# Patient Record
Sex: Female | Born: 1935 | Race: White | Hispanic: No | Marital: Married | Smoking: Former smoker
Health system: Southern US, Community
[De-identification: ages and names within clinical notes are randomized; demographics above are authoritative.]

## PROBLEM LIST (undated history)

## (undated) DIAGNOSIS — I480 Paroxysmal atrial fibrillation: Secondary | ICD-10-CM

## (undated) DIAGNOSIS — M8580 Other specified disorders of bone density and structure, unspecified site: Secondary | ICD-10-CM

## (undated) DIAGNOSIS — R69 Illness, unspecified: Secondary | ICD-10-CM

## (undated) DIAGNOSIS — A159 Respiratory tuberculosis unspecified: Secondary | ICD-10-CM

## (undated) DIAGNOSIS — G459 Transient cerebral ischemic attack, unspecified: Secondary | ICD-10-CM

## (undated) DIAGNOSIS — D649 Anemia, unspecified: Secondary | ICD-10-CM

## (undated) DIAGNOSIS — E785 Hyperlipidemia, unspecified: Secondary | ICD-10-CM

## (undated) DIAGNOSIS — Z950 Presence of cardiac pacemaker: Secondary | ICD-10-CM

## (undated) DIAGNOSIS — I495 Sick sinus syndrome: Secondary | ICD-10-CM

## (undated) DIAGNOSIS — N390 Urinary tract infection, site not specified: Secondary | ICD-10-CM

## (undated) DIAGNOSIS — F0390 Unspecified dementia without behavioral disturbance: Secondary | ICD-10-CM

## (undated) DIAGNOSIS — G5602 Carpal tunnel syndrome, left upper limb: Secondary | ICD-10-CM

## (undated) HISTORY — DX: Presence of cardiac pacemaker: Z95.0

## (undated) HISTORY — DX: Other specified disorders of bone density and structure, unspecified site: M85.80

## (undated) HISTORY — PX: OTHER SURGICAL HISTORY: SHX169

## (undated) HISTORY — DX: Hyperlipidemia, unspecified: E78.5

## (undated) HISTORY — DX: Illness, unspecified: R69

## (undated) HISTORY — PX: LUMBAR FUSION: SHX111

## (undated) HISTORY — DX: Paroxysmal atrial fibrillation: I48.0

## (undated) HISTORY — DX: Carpal tunnel syndrome, left upper limb: G56.02

## (undated) HISTORY — DX: Anemia, unspecified: D64.9

## (undated) HISTORY — PX: ABDOMINAL HYSTERECTOMY: SHX81

## (undated) HISTORY — DX: Transient cerebral ischemic attack, unspecified: G45.9

## (undated) HISTORY — DX: Urinary tract infection, site not specified: N39.0

---

## 1974-09-26 HISTORY — PX: BACK SURGERY: SHX140

## 2007-09-27 DIAGNOSIS — J111 Influenza due to unidentified influenza virus with other respiratory manifestations: Secondary | ICD-10-CM

## 2007-09-27 HISTORY — DX: Influenza due to unidentified influenza virus with other respiratory manifestations: J11.1

## 2008-08-08 ENCOUNTER — Inpatient Hospital Stay (HOSPITAL_COMMUNITY): Admission: EM | Admit: 2008-08-08 | Discharge: 2008-08-11 | Payer: Self-pay | Admitting: Emergency Medicine

## 2010-08-30 ENCOUNTER — Ambulatory Visit: Payer: Self-pay | Admitting: Cardiology

## 2010-10-04 ENCOUNTER — Encounter: Payer: Self-pay | Admitting: Pulmonary Disease

## 2010-10-22 DIAGNOSIS — E785 Hyperlipidemia, unspecified: Secondary | ICD-10-CM | POA: Insufficient documentation

## 2010-10-25 ENCOUNTER — Ambulatory Visit
Admission: RE | Admit: 2010-10-25 | Discharge: 2010-10-25 | Payer: Self-pay | Source: Home / Self Care | Attending: Pulmonary Disease | Admitting: Pulmonary Disease

## 2010-10-25 DIAGNOSIS — R05 Cough: Secondary | ICD-10-CM | POA: Insufficient documentation

## 2010-10-25 DIAGNOSIS — I4892 Unspecified atrial flutter: Secondary | ICD-10-CM | POA: Insufficient documentation

## 2010-11-05 ENCOUNTER — Encounter: Payer: Self-pay | Admitting: Pulmonary Disease

## 2010-11-05 ENCOUNTER — Ambulatory Visit (INDEPENDENT_AMBULATORY_CARE_PROVIDER_SITE_OTHER): Payer: Medicare PPO | Admitting: Pulmonary Disease

## 2010-11-05 DIAGNOSIS — R05 Cough: Secondary | ICD-10-CM

## 2010-11-11 NOTE — Assessment & Plan Note (Signed)
Summary: consult for chronic cough   Copy to:  Guerry Bruin Primary Provider/Referring Provider:  Guerry Bruin  CC:  Pulmonary Consult.  History of Present Illness: The pt is a 77y/o female who I have been asked to see for chronic cough.  The cough started spontaneously approx. 2mos ago, with no URI prodrome.  It was dry and hacky at first, with no significant mucus.  She was treated with a short course of prednisone, with no improvement in cough.  She was also given a trial of dulera, and again she feels it did not help the cough.  Approximately 2 weeks ago, she began to cough up scant nonpurulent mucus.  The pt states that she has a tickle in her throat, along with a classic description of a globus sensation.  She admits that she has ongoing throat clearing.  The cough is worse with activity, and she denies having cough paroxysms.  She denies both GERD and postnasal drip.  A recent cxr shows no acute process, and the pt tells me she has never had pfts.  She does have a history of smoking, but quit over 20 yrs ago.    Current Medications (verified): 1)  Caltrate 600+d 600-400 Mg-Unit Tabs (Calcium Carbonate-Vitamin D) .... Take 1 Tablet By Mouth Once A Day 2)  Lovastatin 10 Mg Tabs (Lovastatin) .... Take 1 Tablet By Mouth Once A Day 3)  Propafenone Hcl 225 Mg Tabs (Propafenone Hcl) .... Take 1 Tablet By Mouth Three Times A Day 4)  Oxybutynin Chloride 5 Mg Tabs (Oxybutynin Chloride) .... Take 1 Tab By Mouth At Bedtime 5)  Proventil Hfa 108 (90 Base) Mcg/act  Aers (Albuterol Sulfate) .Marland Kitchen.. 1-2 Puffs Every 4-6 Hours As Needed 6)  Dulera 200-5 Mcg/act Aero (Mometasone Furo-Formoterol Fum) .... 2 Puffs Twice Per Day 7)  Vitamin C .... Daily 8)  Vitamin D .... Daily 9)  Vitamin B12 .... Daily 10)  Brain Power Supplament .... Daily  Allergies (verified): No Known Drug Allergies  Past History:  Past Medical History:  ATRIAL FIBRILLATION (ICD-427.31)--Dr. [place] HYPERLIPIDEMIA  (ICD-272.4)    Past Surgical History: back surgery 1965 hysterctomy 1969  Family History: Reviewed history from 10/22/2010 and no changes required. father deceased at age 66 : emphysema, HTN, heart disease  mother deceased at age 81. SDAT 1 brother: lymphoma 1 sister: parkinsons  Social History: Reviewed history from 10/22/2010 and no changes required. Patient states former smoker. started at age 64. less than 1 ppd.  Quit 1990.  pt is married pt has children. pt is retired. prev worked in Engineering geologist.   Review of Systems       The patient complains of shortness of breath with activity, shortness of breath at rest, and productive cough.  The patient denies non-productive cough, coughing up blood, chest pain, irregular heartbeats, acid heartburn, indigestion, loss of appetite, weight change, abdominal pain, difficulty swallowing, sore throat, tooth/dental problems, headaches, nasal congestion/difficulty breathing through nose, sneezing, itching, ear ache, anxiety, depression, hand/feet swelling, joint stiffness or pain, rash, change in color of mucus, and fever.    Vital Signs:  Patient profile:   77 year old female Height:      64 inches Weight:      168 pounds BMI:     28.94 O2 Sat:      94 % on Room air Temp:     97.5 degrees F oral Pulse rate:   58 / minute BP sitting:   160 / 92  (right arm) Cuff  size:   regular  Vitals Entered By: Arman Filter LPN (October 25, 2010 2:32 PM)  O2 Flow:  Room air CC: Pulmonary Consult Comments Medications reviewed with patient Arman Filter LPN  October 25, 2010 2:32 PM    Physical Exam  General:  ow female in nad Eyes:  PERRLA and EOMI.   Nose:  no purulence or discharge seen patent bilat. Mouth:  no lesions or exudates seen Neck:  no jvd, tmg, LN Lungs:  totally clear to auscultation Heart:  rrr, no mrg Abdomen:  soft and nontender, bs+ Extremities:  mild edema, no cyanosis  pulses intact distally Neurologic:  alert and  oriented, moves all 4.   Impression & Recommendations:  Problem # 1:  COUGH, CHRONIC (ICD-786.2) the pt has a 2mos h/o chronic cough that I suspect is more upper airway in origin than lower.  Most frequent causes for this include postnasal drip/sinus disease, reflux disease (LPR), and finally cyclical coughing.  I suspect the latter is the issue for the pt currently.  She does not have anything by history or exam to suggest an ongoing pulmonary cause, but this cannot be excluded 100%.  Will treat with the cyclical cough protocol and see how she responds.  If the cough continues, will need spirometry and further investigation.    Medications Added to Medication List This Visit: 1)  Dulera 200-5 Mcg/act Aero (Mometasone furo-formoterol fum) .... 2 puffs twice per day 2)  Vitamin C  .... Daily 3)  Vitamin D  .... Daily 4)  Vitamin B12  .... Daily 5)  Brain Power Supplament  .... Daily 6)  Tussicaps 10-8 Mg Xr12h-cap (Hydrocod polst-chlorphen polst) .... One every 12hrs if needed for cough 7)  Tessalon Perles 100 Mg Caps (Benzonatate) .... Two by mouth every 6 hrs if needed.  Other Orders: Consultation Level IV (16109)  Patient Instructions: 1)  see cyclical cough protocol 2)  once your 3 days on protocol are done, ease back into your normal life 3)  followup with me one week after finishing protocol   Prescriptions: TESSALON PERLES 100 MG  CAPS (BENZONATATE) two by mouth every 6 hrs if needed.  #30 x 0   Entered and Authorized by:   Barbaraann Share MD   Signed by:   Barbaraann Share MD on 10/25/2010   Method used:   Print then Give to Patient   RxID:   [id number] TUSSICAPS 10-8 MG XR12H-CAP (HYDROCOD POLST-CHLORPHEN POLST) one every 12hrs if needed for cough  #10 x 0   Entered and Authorized by:   Barbaraann Share MD   Signed by:   Barbaraann Share MD on 10/25/2010   Method used:   Print then Give to Patient   RxID:   [id number]    Immunization History:  Influenza  Immunization History:    Influenza:  historical (06/26/2010)   Appended Document: consult for chronic cough megan, please see if this pt has an upcoming ov with me.  Appended Document: consult for chronic cough yes. pt is scheduled to see you this Friday 11-05-2010 at 3:30pm

## 2010-11-23 NOTE — Assessment & Plan Note (Signed)
Summary: rov for cough   Copy to:  Guerry Bruin Primary Provider/Referring Provider:  Guerry Bruin  CC:  Follow up appt.  Pt states cough is 100% resolved.   Pt denied any new complaints. .  History of Present Illness: the pt comes in today for f/u of her chronic cough.  At the last visit, this was felt to be UA in origin, and the pt was treated with the cyclical cough protocol.  She reports today the cough is 100% resolved.   Medications Prior to Update: 1)  Caltrate 600+d 600-400 Mg-Unit Tabs (Calcium Carbonate-Vitamin D) .... Take 1 Tablet By Mouth Once A Day 2)  Lovastatin 10 Mg Tabs (Lovastatin) .... Take 1 Tablet By Mouth Once A Day 3)  Propafenone Hcl 225 Mg Tabs (Propafenone Hcl) .... Take 1 Tablet By Mouth Three Times A Day 4)  Oxybutynin Chloride 5 Mg Tabs (Oxybutynin Chloride) .... Take 1 Tab By Mouth At Bedtime 5)  Proventil Hfa 108 (90 Base) Mcg/act  Aers (Albuterol Sulfate) .Marland Kitchen.. 1-2 Puffs Every 4-6 Hours As Needed 6)  Dulera 200-5 Mcg/act Aero (Mometasone Furo-Formoterol Fum) .... 2 Puffs Twice Per Day 7)  Vitamin C .... Daily 8)  Vitamin D .... Daily 9)  Vitamin B12 .... Daily 10)  Brain Power Supplament .... Daily 11)  Tussicaps 10-8 Mg Xr12h-Cap (Hydrocod Polst-Chlorphen Polst) .... One Every 12hrs If Needed For Cough 12)  Tessalon Perles 100 Mg  Caps (Benzonatate) .... Two By Mouth Every 6 Hrs If Needed.  Current Medications (verified): 1)  Caltrate 600+d 600-400 Mg-Unit Tabs (Calcium Carbonate-Vitamin D) .... Take 1 Tablet By Mouth Once A Day 2)  Lovastatin 10 Mg Tabs (Lovastatin) .... Take 1 Tablet By Mouth Once A Day 3)  Propafenone Hcl 225 Mg Tabs (Propafenone Hcl) .... Take 1 Tablet By Mouth Three Times A Day 4)  Oxybutynin Chloride 5 Mg Tabs (Oxybutynin Chloride) .... Take 1 Tab By Mouth At Bedtime 5)  Proventil Hfa 108 (90 Base) Mcg/act  Aers (Albuterol Sulfate) .Marland Kitchen.. 1-2 Puffs Every 4-6 Hours As Needed 6)  Vitamin C .... Daily 7)  Vitamin D ....  Daily 8)  Vitamin B12 .... Daily 9)  Brain Power Supplament .... Daily 10)  Tussicaps 10-8 Mg Xr12h-Cap (Hydrocod Polst-Chlorphen Polst) .... One Every 12hrs If Needed For Cough 11)  Tessalon Perles 100 Mg  Caps (Benzonatate) .... Two By Mouth Every 6 Hrs If Needed.  Allergies (verified): No Known Drug Allergies  Review of Systems  The patient denies shortness of breath with activity, shortness of breath at rest, productive cough, non-productive cough, coughing up blood, chest pain, irregular heartbeats, acid heartburn, indigestion, loss of appetite, weight change, abdominal pain, difficulty swallowing, sore throat, tooth/dental problems, headaches, nasal congestion/difficulty breathing through nose, sneezing, itching, ear ache, anxiety, depression, hand/feet swelling, joint stiffness or pain, rash, change in color of mucus, and fever.    Vital Signs:  Patient profile:   77 year old female Height:      64 inches Weight:      167.50 pounds BMI:     28.86 O2 Sat:      96 % on Room air Temp:     98.1 degrees F oral Pulse rate:   73 / minute BP sitting:   126 / 80  (left arm) Cuff size:   regular  Vitals Entered By: Arman Filter LPN (November 05, 2010 3:20 PM)  O2 Flow:  Room air CC: Follow up appt.  Pt states cough is 100% resolved.  Pt denied any new complaints.  Comments Medications reviewed with patient Arman Filter LPN  November 05, 2010 3:25 PM    Physical Exam  General:  wd female in nad Lungs:  clear Neurologic:  alert, oriented, moves all 4.    Impression & Recommendations:  Problem # 1:  COUGH, CHRONIC (ICD-786.2)  the pt's cough has totally resolved with the cyclical cough protocol.  She is off her inhalers, and denies any issues with upper airway irritation.  I have asked her to return to her normal life, but to intervene early with behavioral therapies in the future due to her propensity to develop a cyclical cough.  Other Orders: Est. Patient Level II  (81191)  Patient Instructions: 1)  stay off inhalers 2)  get back to your normal life, but if cough returns, work on the behavioral therapies we have discussed EARLY 3)  please call if cough returns and is bothering you

## 2010-12-15 ENCOUNTER — Encounter (HOSPITAL_BASED_OUTPATIENT_CLINIC_OR_DEPARTMENT_OTHER)
Admission: RE | Admit: 2010-12-15 | Discharge: 2010-12-15 | Disposition: A | Discharge status: Home / Self Care | Payer: Medicare PPO | Source: Ambulatory Visit | Attending: Orthopedic Surgery | Admitting: Orthopedic Surgery

## 2010-12-15 DIAGNOSIS — Z01812 Encounter for preprocedural laboratory examination: Secondary | ICD-10-CM | POA: Insufficient documentation

## 2010-12-15 LAB — BASIC METABOLIC PANEL
BUN: 16 mg/dL (ref 6–23)
CO2: 26 mEq/L (ref 19–32)
Chloride: 99 mEq/L (ref 96–112)
Creatinine, Ser: 1.02 mg/dL (ref 0.4–1.2)
GFR calc non Af Amer: 52 mL/min — ABNORMAL LOW (ref >60)
Glucose, Bld: 100 mg/dL — ABNORMAL HIGH (ref 70–99)
Sodium: 131 mEq/L — ABNORMAL LOW (ref 135–145)

## 2010-12-20 ENCOUNTER — Ambulatory Visit (HOSPITAL_BASED_OUTPATIENT_CLINIC_OR_DEPARTMENT_OTHER)
Admission: RE | Admit: 2010-12-20 | Discharge: 2010-12-20 | Disposition: A | Discharge status: Home / Self Care | Payer: Medicare PPO | Source: Ambulatory Visit | Attending: Orthopedic Surgery | Admitting: Orthopedic Surgery

## 2010-12-20 DIAGNOSIS — G5602 Carpal tunnel syndrome, left upper limb: Secondary | ICD-10-CM

## 2010-12-20 DIAGNOSIS — J449 Chronic obstructive pulmonary disease, unspecified: Secondary | ICD-10-CM | POA: Insufficient documentation

## 2010-12-20 DIAGNOSIS — J4489 Other specified chronic obstructive pulmonary disease: Secondary | ICD-10-CM | POA: Insufficient documentation

## 2010-12-20 DIAGNOSIS — Z01812 Encounter for preprocedural laboratory examination: Secondary | ICD-10-CM | POA: Insufficient documentation

## 2010-12-20 DIAGNOSIS — I4891 Unspecified atrial fibrillation: Secondary | ICD-10-CM | POA: Insufficient documentation

## 2010-12-20 DIAGNOSIS — G5620 Lesion of ulnar nerve, unspecified upper limb: Secondary | ICD-10-CM | POA: Insufficient documentation

## 2010-12-20 HISTORY — DX: Carpal tunnel syndrome, left upper limb: G56.02

## 2010-12-20 LAB — POCT HEMOGLOBIN-HEMACUE: Hemoglobin: 13.2 g/dL (ref 12.0–15.0)

## 2011-02-08 NOTE — Discharge Summary (Signed)
Mikayla Harper, Mikayla Harper NO.:  [id number]   MEDICAL RECORD NO.:  [id number]          PATIENT TYPE:  INP   LOCATION:  1405                         FACILITY:  [facility]   PHYSICIAN:  Gaspar Garbe, M.D.DATE OF BIRTH:  08-14-1930   DATE OF ADMISSION:  08/08/2008  DATE OF DISCHARGE:  08/11/2008                               DISCHARGE SUMMARY   FINAL DIAGNOSES:  1. Influenza-like illness- H1N1 PCR negative.  2. Urinary tract infection.  3. Hyperlipidemia.  4. History of atrial fibrillation, currently in normal sinus rhythm on      propafenone.  5. Osteopenia.   MEDICATIONS:  1. Augmentin 875 mg twice daily x4 days, to complete a 7-day course.  2. Propafenone 225 mg p.o. t.i.d.  3. Lovastatin 10 mg once daily.  4. Calcium plus D twice daily.  5. Advil or Tylenol p.r.n.   Chest x-ray on admission showed granulomatous disease in the upper  lobes.  Tumor could not be ruled out.  Subsequent CT of the chest  performed on day #2 of hospitalization shows a right upper lobe lung  mass with a heavily calcified granuloma.  Multiple additional nodules in  the upper lobes bilaterally, consistent with granulomatous disease.  Of  note, patient grew up in the [place] area as well.  Changes of  central lobular emphysema.  Small pericardial effusion of unknown  significance.   Urinalysis on admission shows LE large, nitrite positive, bacteria many.  Urine culture shows greater than 100,000 colonies of E. coli.  Sensitivities are currently pending.  TSH 0.733.  B-type natriuretic  peptide 95.  Blood cultures x2 are negative.  White count 12.3,  hemoglobin 10.3, hematocrit 30, platelets 190.   On the date of discharge, creatinine had improved from 1.2 to 0.8 with  hydration.  Otherwise, all LFTs and electrolytes were within normal  limits.   PHYSICAL EXAMINATION AT DISCHARGE:  Blood pressure 139/84, heart rate  70, respiratory rate 20, satting 94% on room air.   Temperature 98.3.  GENERAL:  No acute distress.  HEENT:  Normocephalic and atraumatic.  PERRLA.  EOMI.  ENT within normal  limits.  NECK:  No adenopathy, JVD, or bruits.  HEART:  Regular rate and rhythm.  No murmurs, rubs or gallops  appreciated.  LUNGS:  Clear to auscultation bilaterally.  ABDOMEN:  Soft, nontender.  Normoactive bowel sounds.  NEURO:  Patient is oriented x3 and overall is intact.  EXTREMITIES:  No clubbing, cyanosis or edema.   HOSPITAL COURSE:  Ms. Mikayla Harper was admitted directly from my office  on Friday, the 13th, as a work-in at the end of the morning.  She had  been essentially bed-bound for 2 days and had been sick for a total of  4.  She was brought in by her husband and required a wheelchair to get  down the hallway.  On arrival, she had sats of 84% on room air and just  above 90 on 2 liters.  She had a temperature greater than 102 and was  having shaking chills.  She had noticed some diarrhea and  productive  cough as well as headaches and a runny nose over the past 4 days.  She  had not been able to take in adequate p.o. due to nausea.  The patient  was subsequently directly admitted to [facility].  She was initially  admitted to step-down because her heart rate was 115.  Given her  inability to get up on the exam table, an EKG was not performed in the  office but was found to be sinus tachycardia upon arrival in the  hospital.  She was aggressively hydrated and started empirically on  vancomycin and Zosyn.  Of note, she was 4 days out from the beginning of  her symptoms, which meant that she was not a candidate for antivirals  for H1N1.  An H1N1 swab was obtained with the PCR currently pending.  Blood cultures x2 are negative.  Her urine does currently show E. coli.  She responded particularly well to antipyretics and fluids over the  first 24 hours and upon her morning of discharge, was able to walk about  the room without difficulty and is able to keep  her usual 2 meals of  food down without problems as well.  She did have some subjective  shortness of breath overnight on day #3 and was given a Xopenex  nebulizer, but her sats were 99% on room air at that time.  The patient  is being discharged home to the care of her husband.  She indicates that  she is otherwise okay to perform her activities of daily living without  assistance.  She is the more healthy of the two.   CONDITION ON DISCHARGE:  Stable.      Gaspar Garbe, M.D.  Electronically Signed     RWT/MEDQ  D:  08/11/2008  T:  08/11/2008  Job:  [id number]

## 2011-02-08 NOTE — H&P (Signed)
NAMEALWILDA, GILLAND NO.:  [id number]   MEDICAL RECORD NO.:  [id number]          PATIENT TYPE:  INP   LOCATION:  1235                         FACILITY:  [facility]   PHYSICIAN:  Gaspar Garbe, M.D.DATE OF BIRTH:  [birth year]   DATE OF ADMISSION:  08/08/2008  DATE OF DISCHARGE:                              HISTORY & PHYSICAL   CHIEF COMPLAINT:  Not out of bed x4 days, nausea, vomiting, fevers.   HISTORY OF PRESENT ILLNESS:  The patient is a 77 year old white female  who joined my practice in the spring of this year.  She has an otherwise  relatively unremarkable medical history except some mild hyperlipidemia  and some atrial fibrillation, for which she had been cardioverted back  in [place] and has been on propafenone ever since.  She is, otherwise,  quite healthy but has had some urinary tract infections in the past  year.   The patient called the office wanting an appointment indicating that she  had not been out of bed in the past 4 days and that she felt very ill.  She did not feel that she had a high fever but did not have a way of  taking her temperature at home.  Husband brought her to the office, at  which point she was found to be febrile and hypoxic.  She is requiring  direct admission to the hospital and was started on oxygen in the office  as well.  She continued to have some nausea and has not been eating  particularly well.  She also indicates that she has been off her  propafenone for the past couple of days.  She cannot keep it down.   ALLERGIES:  No known drug allergies.   MEDICATIONS:  1. Propafenone 225 mg p.o. t.i.d.  2. Lovastatin 10 mg daily.  3. Calcium and vitamin D p.r.n.  4. Advil p.r.n.   PAST MEDICAL HISTORY:  1. Atrial fibrillation with history of cardioversion on propafenone.  2. Hyperlipidemia.   PAST SURGICAL HISTORY:  1. Hysterectomy secondary to fibroids in the late 1960s.  2. She has had back surgery in 1976.  3.  History of cataract surgery.   SOCIAL HISTORY:  The patient lives in [place] with her husband.  She  is retired.  She is married with 2 children and has a 30-pack year  smoking history and quit back in the mid 1980s.  She is a nondrinker.   FAMILY HISTORY:  Mother died at age 31 of dementia-related  complications.  Father died at age 52 of emphysema.  She has a family  history of Parkinson's and lymphoma.   REVIEW OF SYSTEMS:  The patient complains of fevers, chills, of which  she is having shaking chills currently, has headaches and runny nose and  appears to be short of breath, a bluish tinge around her lips, having  some coughing, and is also having nausea and vomiting.  She has also  indicated myalgias and increased urinary frequency and asterixis.  The  patient is full code.   PHYSICAL EXAMINATION:  VITAL SIGNS:  Temperature 102.6, pulse 114 and  appears to be irregular, respiratory rate 22, blood pressure 160/100,  and sating 97% on 3 liters.  She was initially 85% on room air.  GENERAL:  The patient appears to be acutely ill, and is having shaking  chills during exam.  HEENT:  Oral mucosa appears to be dry.  No oral ulcers or lesions are  noted.  NECK:  Supple.  No lymphadenopathy, JVD, or bruit.  HEART:  Tachycardic, appears to be with irregularly regular rhythm.  LUNGS:  Clear to auscultation bilaterally.  ABDOMEN:  Some slight nausea with palpation otherwise unremarkable.  EXTREMITIES:  No clubbing, cyanosis, or edema.  NEUROLOGIC:  Oriented to person, place, and time with no neurologic  deficits noted.   TESTS:  All currently pending, which included chest x-ray, blood  cultures x2, urine cultures, CBC, CMET, and thyroid indices.   ASSESSMENT AND PLAN:  1. Acute febrile illness:  It could very well be related to H1N1      influenza.  She will receive influenza testing at the hospital for      seasonal as well as H1N1; however, she is 96 hours from the       initiation as this is not an antiviral candidate.  I am also      empirically starting her on vancomycin and Zosyn as she could very      well be septic.  She is having blood cultures and the urine culture      drawn as well as chest x-ray today.  She will receive IV hydration      and Tylenol necessarily to get her fevers down.  2. History of atrial fibrillation:  Continue her on her propafenone.      If she is found to be back in atrial fibrillation once on telemetry      in the step-down unit, we will have Cardiology see her; otherwise,      this may be a nonissue.  3. Hyperlipidemia:  Continue on her lovastatin.  4. Decreased oxygen saturations:  We will continue her on oxygen and      also check a BMP to make sure she is not in heart failure.  She may      require critical care/Pulmonary consultation if her respiratory      status worsens.  Vancomycin and Zosyn should also cover pneumonia      as well.      Gaspar Garbe, M.D.  Electronically Signed     RWT/MEDQ  D:  08/08/2008  T:  08/09/2008  Job:  [id number]

## 2011-04-15 NOTE — Op Note (Signed)
NAMECAFFIE, SOTTO NO.:  [id number]  MEDICAL RECORD NO.:  [id number]           PATIENT TYPE:  LOCATION:                                 FACILITY:  PHYSICIAN:  Cindee Salt, M.D.            DATE OF BIRTH:  DATE OF PROCEDURE:  12/20/2010 DATE OF DISCHARGE:                              OPERATIVE REPORT   PREOPERATIVE DIAGNOSIS:  Cubital tunnel syndrome, left hand.  POSTOPERATIVE DIAGNOSIS:  Cubital tunnel syndrome, left hand.  OPERATION:  Decompression of left ulnar nerve.  SURGEON:  Cindee Salt, MD  ANESTHESIA:  General.  ANESTHESIOLOGIST:  Burna Forts, MD  HISTORY:  The patient is a 77 year old female with a history of ulnar neuropathy of her left elbow.  This has not responded to conservative treatment.  She has elected to undergo surgical decompression.  Pre and postoperative course have been discussed along with risks and complications.  She is aware that there is no guarantee with the surgery, possibility of infection, recurrence, injury to arteries, nerves, and tendons, incomplete relief of symptoms, and dystrophy.  In the preoperative area, the patient is seen.  The extremity marked by both the patient and surgeon.  Antibiotic given.  PROCEDURE:  The patient is brought to the operating room where a general anesthetic was carried out without difficulty.  She was prepped using ChloraPrep, supine position, left arm free.  A 3-minute dry time was allowed.  Time-out taken confirming the patient and procedure.  The limb was exsanguinated with an Esmarch bandage.  Tourniquet placed high on the arm was inflated to 250 mmHg.  A straight incision was made over the medial epicondyle of the elbow and carried down through subcutaneous tissue.  Bleeders were electrocauterized with bipolar.  Dissection carried down to [employer].  This was released in its posterior aspect.  The ulnar nerve was identified.  The subcutaneous tissue was dissected free  from the underlying fascia of the flexor carpi ulnaris. The superficial fasciotomy was then performed.  The muscle belly was then split using a right-angle retractor.  This allowed visualization of the deep fascia.  KMI carpal tunnel guide was then placed between the nerve and the deep fascia and this was released using an angled ENT scissors for approximately 6 cm to 7 cm distally.  The nerve was visualized over its entire course and found to be intact.  The dissection was then carried proximally.  Again, the fascia dissected free from the overlying subcutaneous tissue.  The two knee retractors were placed allowing visualization of the fascia proximally.  Under direct vision, the fascia was then released to the arcade of Struthers. The arm was placed through full flexion.  No subluxation to the nerve was apparent.  The wounds were copiously irrigated with saline.  The subcutaneous tissue was closed with interrupted 4-0 Vicryl and skin with a subcuticular 4-0 Vicryl Rapide suture.  A sterile compressive dressing and long-arm splint with the elbow flexed approximately 20 degrees was applied.  On deflation of the tourniquet, all fingers immediately pinked.  She was taken to the  recovery room for observation in satisfactory condition.  She will be discharged home to return to [facility] in 1 week on Vicodin.          ______________________________ Cindee Salt, M.D.     GK/MEDQ  D:  12/20/2010  T:  12/21/2010  Job:  [id number]  cc:   Gaspar Garbe, M.D.  Electronically Signed by Cindee Salt M.D. on 04/15/2011 09:04:23 AM

## 2011-06-23 ENCOUNTER — Other Ambulatory Visit: Payer: Self-pay | Admitting: Internal Medicine

## 2011-06-23 DIAGNOSIS — Z1231 Encounter for screening mammogram for malignant neoplasm of breast: Secondary | ICD-10-CM

## 2011-06-28 LAB — BASIC METABOLIC PANEL
CO2: 24
Calcium: 8.3 — ABNORMAL LOW
Calcium: 8.3 — ABNORMAL LOW
Creatinine, Ser: 0.93
GFR calc Af Amer: >60
GFR calc Af Amer: >60
GFR calc non Af Amer: 59 — ABNORMAL LOW
GFR calc non Af Amer: >60
Glucose, Bld: 85
Sodium: 133 — ABNORMAL LOW

## 2011-06-28 LAB — CULTURE, BLOOD (ROUTINE X 2)
Culture: NO GROWTH
Culture: NO GROWTH

## 2011-06-28 LAB — CBC
HCT: 30 — ABNORMAL LOW
HCT: 36.9
MCHC: 34.3
MCV: 94.4
MCV: 95.5
Platelets: 190
Platelets: 190
RBC: 3.2 — ABNORMAL LOW
RBC: 3.9
RDW: 12.9
RDW: 13.1
RDW: 13.3
WBC: 11.3 — ABNORMAL HIGH
WBC: 4.3

## 2011-06-28 LAB — COMPREHENSIVE METABOLIC PANEL
ALT: 14
AST: 28
Albumin: 3.3 — ABNORMAL LOW
Calcium: 8.7
Chloride: 100
GFR calc non Af Amer: 44 — ABNORMAL LOW
Sodium: 130 — ABNORMAL LOW

## 2011-06-28 LAB — DIFFERENTIAL
Eosinophils Absolute: 0
Eosinophils Relative: 0
Lymphs Abs: 0.5 — ABNORMAL LOW
Monocytes Absolute: 1.2 — ABNORMAL HIGH
Monocytes Relative: 11

## 2011-06-28 LAB — URINE MICROSCOPIC-ADD ON

## 2011-06-28 LAB — URINALYSIS, ROUTINE W REFLEX MICROSCOPIC
Glucose, UA: NEGATIVE
Ketones, ur: 40 — AB
Nitrite: POSITIVE — AB

## 2011-06-28 LAB — GLUCOSE, CAPILLARY

## 2011-06-28 LAB — URINE CULTURE: Colony Count: >=100000

## 2011-06-28 LAB — H1N1 SCREEN (PCR): H1N1 Virus Scrn: NOT DETECTED

## 2011-06-30 ENCOUNTER — Ambulatory Visit
Admission: RE | Admit: 2011-06-30 | Discharge: 2011-06-30 | Disposition: A | Discharge status: Home / Self Care | Payer: Medicare PPO | Source: Ambulatory Visit | Attending: Internal Medicine | Admitting: Internal Medicine

## 2011-06-30 DIAGNOSIS — Z1231 Encounter for screening mammogram for malignant neoplasm of breast: Secondary | ICD-10-CM

## 2011-08-27 DIAGNOSIS — G459 Transient cerebral ischemic attack, unspecified: Secondary | ICD-10-CM

## 2011-08-27 HISTORY — DX: Transient cerebral ischemic attack, unspecified: G45.9

## 2011-09-02 ENCOUNTER — Ambulatory Visit: Payer: Medicare PPO | Admitting: Cardiology

## 2011-10-06 DIAGNOSIS — I4891 Unspecified atrial fibrillation: Secondary | ICD-10-CM | POA: Insufficient documentation

## 2011-10-06 DIAGNOSIS — I480 Paroxysmal atrial fibrillation: Secondary | ICD-10-CM | POA: Insufficient documentation

## 2011-10-11 ENCOUNTER — Encounter: Payer: Self-pay | Admitting: Cardiology

## 2011-10-11 ENCOUNTER — Ambulatory Visit (INDEPENDENT_AMBULATORY_CARE_PROVIDER_SITE_OTHER): Payer: Medicare PPO | Admitting: Cardiology

## 2011-10-11 ENCOUNTER — Other Ambulatory Visit: Payer: Medicare Other | Admitting: *Deleted

## 2011-10-11 ENCOUNTER — Ambulatory Visit (INDEPENDENT_AMBULATORY_CARE_PROVIDER_SITE_OTHER)
Admission: RE | Admit: 2011-10-11 | Discharge: 2011-10-11 | Disposition: A | Discharge status: Home / Self Care | Payer: Medicare Other | Source: Ambulatory Visit | Attending: Cardiology | Admitting: Cardiology

## 2011-10-11 VITALS — BP 150/88 | HR 50 | Ht 63.0 in | Wt 161.0 lb

## 2011-10-11 DIAGNOSIS — E785 Hyperlipidemia, unspecified: Secondary | ICD-10-CM

## 2011-10-11 DIAGNOSIS — I10 Essential (primary) hypertension: Secondary | ICD-10-CM | POA: Insufficient documentation

## 2011-10-11 DIAGNOSIS — G459 Transient cerebral ischemic attack, unspecified: Secondary | ICD-10-CM

## 2011-10-11 DIAGNOSIS — I4891 Unspecified atrial fibrillation: Secondary | ICD-10-CM

## 2011-10-11 LAB — CBC WITH DIFFERENTIAL/PLATELET
Eosinophils Absolute: 0.2 10*3/uL (ref 0.0–0.7)
MCHC: 34 g/dL (ref 30.0–36.0)
MCV: 97.9 fl (ref 78.0–100.0)
Monocytes Absolute: 0.5 10*3/uL (ref 0.1–1.0)
Neutrophils Relative %: 55.1 % (ref 43.0–77.0)
Platelets: 223 10*3/uL (ref 150.0–400.0)
RDW: 12.9 % (ref 11.5–14.6)

## 2011-10-11 LAB — LIPID PANEL
Cholesterol: 195 mg/dL (ref 0–200)
Total CHOL/HDL Ratio: 3
Triglycerides: 94 mg/dL (ref 0.0–149.0)

## 2011-10-11 LAB — BASIC METABOLIC PANEL
BUN: 15 mg/dL (ref 6–23)
CO2: 29 mEq/L (ref 19–32)
Calcium: 10.1 mg/dL (ref 8.4–10.5)
Creatinine, Ser: 1.2 mg/dL (ref 0.4–1.2)

## 2011-10-11 LAB — HEPATIC FUNCTION PANEL
AST: 27 U/L (ref 0–37)
Albumin: 4.1 g/dL (ref 3.5–5.2)
Alkaline Phosphatase: 57 U/L (ref 39–117)
Total Protein: 6.8 g/dL (ref 6.0–8.3)

## 2011-10-11 MED ORDER — LISINOPRIL 10 MG PO TABS: 10.0000 mg | ORAL_TABLET | Freq: Every day | ORAL | Status: DC

## 2011-10-11 NOTE — Assessment & Plan Note (Signed)
Her symptoms of 3 weeks ago are consistent with a TIA with aphasia. I recommended complete workup including lab work today. This will include a CBC, chemistries, and lipid panel. We will also schedule her for carotid Doppler studies and echocardiogram. We will obtain a CT of the head. At the present time we'll start her on aspirin 325 mg daily. I'll followup again in one month.

## 2011-10-11 NOTE — Assessment & Plan Note (Signed)
Blood pressure is significantly elevated today. Will start her on lisinopril 10 mg daily.

## 2011-10-11 NOTE — Patient Instructions (Signed)
We will check blood work on you today.  We will schedule you for the following studies: Cranial CT scan, Echocardiogram, Carotid dopplers.  We will have you wear a monitor to see if you have any arrhythmia  Take ASA 325 mg daily.  We will start lisinopril 10 mg daily for blood pressure.   I will see you again in 1 month.

## 2011-10-11 NOTE — Assessment & Plan Note (Signed)
No clear-cut episodes of recurrence on Propafenone. Given her recent TIA symptoms I recommended an event monitor to rule out asymptomatic recurrences. If so we would need to consider long-term anticoagulant therapy.

## 2011-10-11 NOTE — Progress Notes (Signed)
[name]. Fahey Date of Birth: 1930-08-14 Medical Record #[id number]  History of Present Illness: Mrs. Chalupa is seen today for yearly followup. She has a history of recurrent atrial fibrillation and had 3 cardioversion procedures in the past while living in [place]. She has been on chronic propafenone. Per her knowledge she has no recurrent since 2003. She denies any recurrent palpitations at this time. She does report that 2 months ago she was watching TV when she got a sudden sharp pain beneath her left breast. It was very brief but didn't catch her breath. On 3 weeks ago she had an acute episode where she had difficulty talking. She had pain in the back of her head. This lasted 15-20 minutes. She took aspirin went to bed and her symptoms resolved. She did not seek medical attention. He does not appear to have any focal weakness or numbness.  Current Outpatient Prescriptions on File Prior to Visit  Medication Sig Dispense Refill  . calcium carbonate (OS-CAL) 600 MG TABS Take 600 mg by mouth 2 (two) times daily with a meal.      . lovastatin (MEVACOR) 10 MG tablet Take 10 mg by mouth at bedtime.      . propafenone (RYTHMOL) 225 MG tablet Take 225 mg by mouth 3 (three) times daily.         No Known Allergies  Past Medical History  Diagnosis Date  . Arrhythmia     atrial fibrillation  . Hyperlipidemia   . Carpal tunnel syndrome of left wrist 12/20/2010    left hand  . Influenza-like illness 2009    H1N1 PCR negative  . Urinary tract infection   . Hyperlipidemia   . Osteopenia   . Hypoxic   . Febrile     Past Surgical History  Procedure Date  . Back surgery 1976  . Other surgical history In the late 1960's    Hysterectomy secondary to fibroids  . Other surgical history     cataract surgery    History  Smoking status  . Former Smoker  Smokeless tobacco  . Former Neurosurgeon  . Quit date: 10/05/1988    History  Alcohol Use No    Family History  Problem Relation Age of  Onset  . Dementia Mother   . Emphysema Father     Review of Systems: As noted in history of present illness.  All other systems were reviewed and are negative.  Physical Exam: BP 150/88  Pulse 50  Ht 5\' 3"  (1.6 m)  Wt 161 lb (73.029 kg)  BMI 28.52 kg/m2 She is a pleasant elderly white female in no acute distress.The patient is alert and oriented x 3.  The mood and affect are normal.  The skin is warm and dry.  Color is normal.  The HEENT exam reveals that the sclera are nonicteric.  The mucous membranes are moist.  The carotids are 2+ without bruits.  There is no thyromegaly.  There is no JVD.  The lungs are clear.  The chest wall is non tender.  The heart exam reveals a regular rate with a normal S1 and S2.  There are no murmurs, gallops, or rubs.  The PMI is not displaced.   Abdominal exam reveals good bowel sounds.  There is no guarding or rebound.  There is no hepatosplenomegaly or tenderness.  There are no masses.  Exam of the legs reveal no clubbing, cyanosis, or edema.  The legs are without rashes.  The distal pulses are  intact.  Cranial nerves II - XII are intact.  Motor and sensory functions are intact.  The gait is normal.  LABORATORY DATA: ECG today demonstrates sinus bradycardia with a rate of 50 beats per minute. There is low voltage.  Assessment / Plan:

## 2011-10-12 ENCOUNTER — Telehealth: Payer: Self-pay | Admitting: Cardiology

## 2011-10-12 MED ORDER — LOVASTATIN 20 MG PO TABS: 20.0000 mg | ORAL_TABLET | Freq: Every day | ORAL | Status: DC

## 2011-10-12 NOTE — Telephone Encounter (Signed)
Patient called,stated she took lisinopril 10 mg last night ,B/P 77/49,felt light headed.States B/P this am 100/70.Spoke with Norma Fredrickson NP she advised to hold lisinopril today and call in morning to report B/P.

## 2011-10-12 NOTE — Telephone Encounter (Signed)
New Msg: Pt calling stating that pt BP is 77/49 and lightheadedness. Pt would like to discuss this with MD. Please return pt call to discuss further.

## 2011-10-13 ENCOUNTER — Telehealth: Payer: Self-pay | Admitting: Cardiology

## 2011-10-13 NOTE — Telephone Encounter (Signed)
Spoke with patient and she reports she feels great today.  Advised pt to check B/P twice daily and keep a log and report to [place] if she has any problems.

## 2011-10-13 NOTE — Telephone Encounter (Signed)
New Problem:    Patient called in, per request from Anabel Halon yesterday due to her low blood pressure, to report her blood pressure after being taken off her lisinopril (PRINIVIL,ZESTRIL) 10 MG tablet. She reported that her blood pressure was good today, 114/62, but to give her a call back if anyone had any questions.

## 2011-10-13 NOTE — Telephone Encounter (Signed)
Patient has an E Health and safety inspector. A technician monitoring called to report a 3.6 second pause on the monitor. Dr. Antoine Poche DOD recommends for pt to be seen tomorrow by Norma Fredrickson NP. Appointment was made for 11:00 AM patient aware.

## 2011-10-14 ENCOUNTER — Ambulatory Visit (INDEPENDENT_AMBULATORY_CARE_PROVIDER_SITE_OTHER): Payer: Medicare Other | Admitting: Nurse Practitioner

## 2011-10-14 ENCOUNTER — Encounter: Payer: Self-pay | Admitting: Nurse Practitioner

## 2011-10-14 VITALS — BP 128/82 | HR 45 | Ht 63.0 in | Wt 160.0 lb

## 2011-10-14 DIAGNOSIS — I455 Other specified heart block: Secondary | ICD-10-CM

## 2011-10-14 DIAGNOSIS — I10 Essential (primary) hypertension: Secondary | ICD-10-CM

## 2011-10-14 DIAGNOSIS — I4891 Unspecified atrial fibrillation: Secondary | ICD-10-CM

## 2011-10-14 NOTE — Assessment & Plan Note (Signed)
She is in sinus today but with marked bradycardia. Propafenone is stopped today. She will need pacemaker implant. EP to see.

## 2011-10-14 NOTE — Progress Notes (Signed)
Mikayla Harper. Mikayla Harper Date of Birth: [birth year] Medical Record #[id number]  History of Present Illness: [name] is seen today for a work in visit. She is seen for Dr. Swazil[place]. She was here earlier in the week to establish cardiology care. She has had a history of atrial fib and has had past cardioversions x 3 while living in [place]. She has been on chronic propafenone. She says she has not had recurrence since 2003. She has had what was felt to be a recent TIA. She has been scheduled for an echo, carotid dopplers and labs. Blood pressure medicine was started, which she did not tolerate. Her blood pressure dropped to 70's systolic. An event monitor was also placed to see if she was having recurrent arrhythmia. This has already showed several pauses ranging from 3.0 to 4.9 seconds. She has not had syncope with these pauses.   She comes in today. She says she continues to feel the same way as when she was here earlier. She remains tired. She feels exhausted at times. She does get dizzy but has not had syncope. She says that when she feels dizzy, she goes and lies down. She is not driving. No chest pain reported.   Current Outpatient Prescriptions on File Prior to Visit  Medication Sig Dispense Refill  . calcium carbonate (OS-CAL) 600 MG TABS Take 600 mg by mouth 2 (two) times daily with a meal.      . lovastatin (MEVACOR) 20 MG tablet Take 1 tablet (20 mg total) by mouth at bedtime.  90 tablet  3  . oxybutynin (DITROPAN) 5 MG tablet Take 5 mg by mouth daily.         Allergies  Allergen Reactions  . Lisinopril     weakness    Past Medical History  Diagnosis Date  . PAF (paroxysmal atrial fibrillation)     on propafenone  . Hyperlipidemia   . Carpal tunnel syndrome of left wrist 12/20/2010    left hand  . Influenza-like illness 2009    H1N1 PCR negative  . Urinary tract infection   . Hyperlipidemia     on Mevacor  . Osteopenia   . TIA (transient ischemic attack) Dec 2012    Past  Surgical History  Procedure Date  . Back surgery 1976  . Other surgical history In the late 1960's    Hysterectomy secondary to fibroids  . Other surgical history     cataract surgery    History  Smoking status  . Former Smoker  Smokeless tobacco  . Former Neurosurgeon  . Quit date: 10/05/1988    History  Alcohol Use No    Family History  Problem Relation Age of Onset  . Dementia Mother   . Emphysema Father     Review of Systems: The review of systems is per the HPI.  All other systems were reviewed and are negative.  Physical Exam: BP 128/82  Pulse 45  Ht 5\' 3"  (1.6 m)  Wt 160 lb (72.576 kg)  BMI 28.34 kg/m2 Patient is very pleasant and in no acute distress. Skin is warm and dry. Color is normal.  HEENT is unremarkable. Normocephalic/atraumatic. PERRL. Sclera are nonicteric. Neck is supple. No masses. No JVD. Lungs are clear. Cardiac exam shows a regular rate and rhythm. Rate is slow.  Abdomen is soft. Extremities are without edema. Gait and ROM are intact. No gross neurologic deficits noted.   LABORATORY DATA: EKG today shows marked bradycardia with 1st degree AV block. EKG  tracing and Ecardio strips are reviewed with Dr. Swaziland.   Assessment / Plan:

## 2011-10-14 NOTE — Patient Instructions (Signed)
Stop the propafenone.  Continue to wear the heart monitor.  We are going to get you an appointment with the EP doctors (electrician doctor) to talk about a pacemaker.  No driving.  Limit your activities.  Call the [facility] office at [phone] or go to the ER if you have any questions, problems or concerns.

## 2011-10-14 NOTE — Assessment & Plan Note (Signed)
Blood pressure is ok today.  

## 2011-10-14 NOTE — Assessment & Plan Note (Signed)
She has had several pauses noted. She has not had syncope but does report significant fatigue and dizziness. She is not driving. She is subsequently seen with Dr. Swaziland today. We have stopped the propafenone altogether. Would like for her to see EP early next week for pacemaker implantation. She is to minimize her activities. She is already not driving. She will continue to wear the event monitor. She is to call or go to the ER for any problems in the interim. Patient is agreeable to this plan and will call if any problems develop in the interim.

## 2011-10-17 ENCOUNTER — Other Ambulatory Visit: Payer: Self-pay

## 2011-10-19 ENCOUNTER — Encounter: Payer: Self-pay | Admitting: Internal Medicine

## 2011-10-19 ENCOUNTER — Encounter: Payer: Self-pay | Admitting: *Deleted

## 2011-10-19 ENCOUNTER — Ambulatory Visit (INDEPENDENT_AMBULATORY_CARE_PROVIDER_SITE_OTHER): Payer: Medicare Other | Admitting: Internal Medicine

## 2011-10-19 DIAGNOSIS — I455 Other specified heart block: Secondary | ICD-10-CM

## 2011-10-19 DIAGNOSIS — I4891 Unspecified atrial fibrillation: Secondary | ICD-10-CM

## 2011-10-19 DIAGNOSIS — G459 Transient cerebral ischemic attack, unspecified: Secondary | ICD-10-CM

## 2011-10-19 NOTE — Assessment & Plan Note (Signed)
Patient is having recurrent significantly symptomatic pausing with up to 5 seconds documented. She also has heart rates into the 20s documented. Pacing is clearly indicated for treatment of bradycardia as well as to allow for treatment of her atrial fibrillation. I reviewed with her and her husband the potential benefits as well as risks including but not limited to death perforation of heart lung lead dislodgment infection they understand these risks and are willing to proceed.  We will try to get the echocardiogram prior to the procedure. I presume that her left ventricular function in the past has been normal based on that she is on Rythmol

## 2011-10-19 NOTE — Assessment & Plan Note (Signed)
As above. She has a CHADS-VASc score of 5 H&H anticoagulation. I reviewed with her alternatives. Will anticipate initiation of Rivaroxaban following pacemaker implantation. I've asked her to discontinue her aspirin

## 2011-10-19 NOTE — Patient Instructions (Signed)
Your physician has recommended that you have a pacemaker inserted. A pacemaker is a small device that is placed under the skin of your chest or abdomen to help control abnormal heart rhythms. This device uses electrical pulses to prompt the heart to beat at a normal rate. Pacemakers are used to treat heart rhythms that are too slow. Wire (leads) are attached to the pacemaker that goes into the chambers of you heart. This is done in the hospital and usually requires and overnight stay. Please see the instruction sheet given to you today for more information.  We have rescheduled you heart ultrasound for Thursday 10/20/11 at 1:00pm.

## 2011-10-19 NOTE — Assessment & Plan Note (Signed)
As above.

## 2011-10-19 NOTE — Progress Notes (Signed)
CARDIOLOGY CONSULT NOTE  Patient ID: Mikayla Harper, MRN: [id number], DOB/AGE: 02-25-1930 78 y.o. Admit date: (Not on file) Date of Consult: 10/19/2011  Primary Physician: Gaspar Garbe, MD, MD Primary Cardiologist: [place]  Chief Complaint: Bradycardia and recurrent episodes of dizziness and presyncope  Mikayla Harper was seen just recently establish cardiology care. She has had a history of atrial fib and has had past cardioversions x 3 while living in [place] She has been on chronic propafenone. She says she has not had recurrence since 2003. She has had what was felt to be a recent TIA. She has been scheduled for an echo, carotid dopplers and labs. Blood pressure medicine was started, which she did not tolerate. Her blood pressure dropped to 70's systolic. An event monitor was also placed to see if she was having recurrent arrhythmia. This has already showed several pauses ranging from 3.0 to 4.9 seconds. She has not had syncope with these pauses.  She's been unaware of atrial fibrillation.  Thromboembolic risk factors are notable for gender and age and prior TIA. She does has a CHADS-VASc score of 5;  she is not currently on anticoagulation. She is on aspirin     Past Medical History  Diagnosis Date  . PAF (paroxysmal atrial fibrillation)     on propafenone  . Hyperlipidemia   . Carpal tunnel syndrome of left wrist 12/20/2010    left hand  . Influenza-like illness 2009    H1N1 PCR negative  . Urinary tract infection   . Hyperlipidemia     on Mevacor  . Osteopenia   . TIA (transient ischemic attack) Dec 2012      Surgical History:  Past Surgical History  Procedure Date  . Back surgery 1976  . Other surgical history In the late 1960's    Hysterectomy secondary to fibroids  . Other surgical history     cataract surgery     Home Meds: Prior to Admission medications   Medication Sig Start Date End Date Taking? Authorizing Provider  calcium carbonate (OS-CAL) 600 MG TABS  Take 600 mg by mouth 2 (two) times daily with a meal.   Yes Historical Provider, MD  lovastatin (MEVACOR) 20 MG tablet Take 1 tablet (20 mg total) by mouth at bedtime. 10/12/11 10/11/12 Yes Peter Swaziland, MD  oxybutynin (DITROPAN) 5 MG tablet Take 5 mg by mouth daily.  10/07/11  Yes Historical Provider, MD    Inpatient Medications:     Allergies:  Allergies  Allergen Reactions  . Lisinopril     weakness    History   Social History  . Marital Status: Married    Spouse Name: N/A    Number of Children: N/A  . Years of Education: N/A   Occupational History  . Not on file.   Social History Main Topics  . Smoking status: Former Games developer  . Smokeless tobacco: Former Neurosurgeon    Quit date: 10/05/1988  . Alcohol Use: No  . Drug Use: No  . Sexually Active: No   Other Topics Concern  . Not on file   Social History Narrative  . No narrative on file     Family History  Problem Relation Age of Onset  . Dementia Mother   . Emphysema Father      Review of Systems: General: negative for chills, fever, night sweats or weight changes.   All other systems reviewed and are otherwise negative except as noted above.  Physical Exam: Blood pressure 138/82, pulse 60, height 5\' 3"  (1.6 m), weight 160 lb 8 oz (72.802 kg). General: Well developed, well nourished, in no acute distress. Head: Normocephalic, atraumatic, sclera non-icteric, no xanthomas, nares are without discharge. Lymph Nodes:  none Neck: Negative for carotid bruits. JVD not elevated. Lungs: Clear bilaterally to auscultation without wheezes, rales, or rhonchi. Breathing is unlabored. Heart: RRR with S1 S2. No murmurs, rubs, or gallops appreciated. Abdomen: Soft, non-tender, non-distended with normoactive bowel sounds. No hepatomegaly. No rebound/guarding. No obvious abdominal masses. Msk:  Strength and tone appear normal for age. Extremities: No clubbing or cyanosis. No edema.  Distal pedal pulses are 2+ and equal  bilaterally. Skin: Warm and Dry Neuro: Alert and oriented X 3. CN III-XII intact Grossly normal sensory and motor function . Psych:  Responds to questions appropriately with a normal affect.      Labs:  CBC Lab Results  Component Value Date   WBC 5.1 10/11/2011   HGB 12.6 10/11/2011   HCT 37.2 10/11/2011   MCV 97.9 10/11/2011   PLT 223.0 10/11/2011    Lab Results  Component Value Date   CHOL 195 10/11/2011   HDL 74.50 10/11/2011   LDLCALC 102* 10/11/2011   TRIG 94.0 10/11/2011   Miscellaneous No results found for this basename: DDIMER    Radiology/Studies:  Ct Head Wo Contrast  10/11/2011  *RADIOLOGY REPORT*  Clinical Data: Patient with one episode of garbled speech 2 weeks ago lasting for 10 minutes.  This was followed by headache. History of atrial fibrillation and hypertension.  CT HEAD WITHOUT CONTRAST  Technique:  Contiguous axial images were obtained from the base of the skull through the vertex without contrast.  Comparison: None.  Findings: There is no evidence for acute infarction, intracranial hemorrhage, mass lesion, hydrocephalus, or extra-axial fluid.  Mild age appropriate atrophy is noted.  There may be slight chronic microvascular ischemic change in the periventricular and subcortical white matter.  There are no visible areas of chronic infarction.  The calvarium is intact.  There is mild carotid siphon calcification.  Sinuses and mastoids are essentially clear. There are no orbital abnormalities although the patient appears to have had bilateral cataract extraction.  IMPRESSION: Mild age appropriate atrophy and chronic microvascular ischemic change.  No visible acute stroke or intracranial mass lesion. Intact calvarium.  Original Report Authenticated By: Elsie Stain, M.D.    EKG:  Event recorder demonstrated numerous pauses both while awake and while asleep up to 5 seconds long. In addition while she was here seeing Dr. Swaziland she had spelled her heart rate went into the  20s. These were primarily sinus pauses as well some posttermination papuses

## 2011-10-20 ENCOUNTER — Encounter (HOSPITAL_COMMUNITY): Payer: Self-pay | Admitting: Pharmacy Technician

## 2011-10-20 ENCOUNTER — Other Ambulatory Visit (INDEPENDENT_AMBULATORY_CARE_PROVIDER_SITE_OTHER): Payer: Medicare Other | Admitting: *Deleted

## 2011-10-20 ENCOUNTER — Ambulatory Visit (HOSPITAL_BASED_OUTPATIENT_CLINIC_OR_DEPARTMENT_OTHER): Payer: Medicare Other

## 2011-10-20 DIAGNOSIS — I455 Other specified heart block: Secondary | ICD-10-CM

## 2011-10-20 DIAGNOSIS — E785 Hyperlipidemia, unspecified: Secondary | ICD-10-CM

## 2011-10-20 DIAGNOSIS — G459 Transient cerebral ischemic attack, unspecified: Secondary | ICD-10-CM

## 2011-10-20 DIAGNOSIS — I4891 Unspecified atrial fibrillation: Secondary | ICD-10-CM

## 2011-10-20 DIAGNOSIS — I10 Essential (primary) hypertension: Secondary | ICD-10-CM

## 2011-10-20 LAB — BASIC METABOLIC PANEL
BUN: 21 mg/dL (ref 6–23)
CO2: 29 mEq/L (ref 19–32)
Chloride: 104 mEq/L (ref 96–112)
Creatinine, Ser: 1 mg/dL (ref 0.4–1.2)
Glucose, Bld: 98 mg/dL (ref 70–99)

## 2011-10-20 LAB — CBC WITH DIFFERENTIAL/PLATELET
Basophils Relative: 0.8 % (ref 0.0–3.0)
Eosinophils Absolute: 0.2 10*3/uL (ref 0.0–0.7)
MCHC: 33.7 g/dL (ref 30.0–36.0)
MCV: 97.2 fl (ref 78.0–100.0)
Monocytes Absolute: 0.7 10*3/uL (ref 0.1–1.0)
Neutrophils Relative %: 53.6 % (ref 43.0–77.0)
RBC: 3.85 Mil/uL — ABNORMAL LOW (ref 3.87–5.11)
RDW: 13.1 % (ref 11.5–14.6)

## 2011-10-20 LAB — PROTIME-INR: INR: 0.9 ratio (ref 0.8–1.0)

## 2011-10-20 MED ORDER — SODIUM CHLORIDE 0.45 % IV SOLN
INTRAVENOUS | Status: DC
  Administered 2011-10-21: 13:00:00 via INTRAVENOUS

## 2011-10-20 MED ORDER — CEFAZOLIN SODIUM 1-5 GM-% IV SOLN: 1.0000 g | INTRAVENOUS | Status: DC

## 2011-10-20 MED ORDER — SODIUM CHLORIDE 0.9 % IV SOLN: INTRAVENOUS | Status: DC

## 2011-10-20 MED ORDER — SODIUM CHLORIDE 0.9 % IR SOLN
80.0000 mg | Status: DC
  Filled 2011-10-20: qty 2

## 2011-10-20 MED ORDER — CHLORHEXIDINE GLUCONATE 4 % EX LIQD
60.0000 mL | Freq: Once | CUTANEOUS | Status: DC
  Filled 2011-10-20: qty 60

## 2011-10-21 ENCOUNTER — Ambulatory Visit (HOSPITAL_COMMUNITY)
Admission: RE | Admit: 2011-10-21 | Discharge: 2011-10-22 | Disposition: A | Discharge status: Home / Self Care | Payer: Medicare Other | Source: Ambulatory Visit | Attending: Internal Medicine | Admitting: Internal Medicine

## 2011-10-21 ENCOUNTER — Encounter (HOSPITAL_COMMUNITY): Payer: Self-pay | Admitting: General Practice

## 2011-10-21 ENCOUNTER — Encounter (HOSPITAL_COMMUNITY)
Admission: RE | Disposition: A | Discharge status: Home / Self Care | Payer: Self-pay | Source: Ambulatory Visit | Attending: Internal Medicine

## 2011-10-21 DIAGNOSIS — I4891 Unspecified atrial fibrillation: Secondary | ICD-10-CM | POA: Insufficient documentation

## 2011-10-21 DIAGNOSIS — I10 Essential (primary) hypertension: Secondary | ICD-10-CM | POA: Insufficient documentation

## 2011-10-21 DIAGNOSIS — I455 Other specified heart block: Secondary | ICD-10-CM

## 2011-10-21 DIAGNOSIS — G459 Transient cerebral ischemic attack, unspecified: Secondary | ICD-10-CM

## 2011-10-21 DIAGNOSIS — I495 Sick sinus syndrome: Secondary | ICD-10-CM | POA: Insufficient documentation

## 2011-10-21 DIAGNOSIS — Z8673 Personal history of transient ischemic attack (TIA), and cerebral infarction without residual deficits: Secondary | ICD-10-CM | POA: Insufficient documentation

## 2011-10-21 DIAGNOSIS — I480 Paroxysmal atrial fibrillation: Secondary | ICD-10-CM | POA: Insufficient documentation

## 2011-10-21 DIAGNOSIS — E785 Hyperlipidemia, unspecified: Secondary | ICD-10-CM | POA: Insufficient documentation

## 2011-10-21 HISTORY — PX: INSERT / REPLACE / REMOVE PACEMAKER: SUR710

## 2011-10-21 HISTORY — PX: PERMANENT PACEMAKER INSERTION: SHX5480

## 2011-10-21 HISTORY — DX: Respiratory tuberculosis unspecified: A15.9

## 2011-10-21 HISTORY — DX: Sick sinus syndrome: I49.5

## 2011-10-21 SURGERY — PERMANENT PACEMAKER INSERTION
Anesthesia: LOCAL

## 2011-10-21 MED ORDER — CEFAZOLIN SODIUM 1-5 GM-% IV SOLN
1.0000 g | Freq: Four times a day (QID) | INTRAVENOUS | Status: AC
  Administered 2011-10-21 – 2011-10-22 (×3): 1 g via INTRAVENOUS
  Filled 2011-10-21 (×3): qty 50

## 2011-10-21 MED ORDER — LIDOCAINE HCL (PF) 1 % IJ SOLN
INTRAMUSCULAR | Status: AC
  Filled 2011-10-21: qty 60

## 2011-10-21 MED ORDER — SIMVASTATIN 5 MG PO TABS
5.0000 mg | ORAL_TABLET | Freq: Every day | ORAL | Status: DC
  Administered 2011-10-21: 5 mg via ORAL
  Filled 2011-10-21 (×2): qty 1

## 2011-10-21 MED ORDER — MIDAZOLAM HCL 5 MG/5ML IJ SOLN
INTRAMUSCULAR | Status: AC
  Filled 2011-10-21: qty 5

## 2011-10-21 MED ORDER — MUPIROCIN 2 % EX OINT
TOPICAL_OINTMENT | Freq: Two times a day (BID) | CUTANEOUS | Status: DC
  Administered 2011-10-21 – 2011-10-22 (×2): via NASAL

## 2011-10-21 MED ORDER — CEFAZOLIN SODIUM 1-5 GM-% IV SOLN
INTRAVENOUS | Status: AC
  Filled 2011-10-21: qty 50

## 2011-10-21 MED ORDER — ONDANSETRON HCL 4 MG/2ML IJ SOLN: 4.0000 mg | Freq: Four times a day (QID) | INTRAMUSCULAR | Status: DC | PRN

## 2011-10-21 MED ORDER — SODIUM CHLORIDE 0.45 % IV SOLN: INTRAVENOUS | Status: DC

## 2011-10-21 MED ORDER — OXYBUTYNIN CHLORIDE 5 MG PO TABS
5.0000 mg | ORAL_TABLET | Freq: Every day | ORAL | Status: DC
  Administered 2011-10-21 – 2011-10-22 (×2): 5 mg via ORAL
  Filled 2011-10-21 (×2): qty 1

## 2011-10-21 MED ORDER — MUPIROCIN 2 % EX OINT
TOPICAL_OINTMENT | CUTANEOUS | Status: AC
  Filled 2011-10-21: qty 22

## 2011-10-21 MED ORDER — HEPARIN (PORCINE) IN NACL 2-0.9 UNIT/ML-% IJ SOLN
INTRAMUSCULAR | Status: AC
  Filled 2011-10-21: qty 1000

## 2011-10-21 MED ORDER — FENTANYL CITRATE 0.05 MG/ML IJ SOLN
INTRAMUSCULAR | Status: AC
  Filled 2011-10-21: qty 2

## 2011-10-21 MED ORDER — ACETAMINOPHEN 325 MG PO TABS
325.0000 mg | ORAL_TABLET | ORAL | Status: DC | PRN
  Administered 2011-10-21 – 2011-10-22 (×2): 650 mg via ORAL
  Filled 2011-10-21: qty 2

## 2011-10-21 NOTE — Brief Op Note (Signed)
10/21/2011  2:39 PM  PATIENT:  Mikayla Harper  78 y.o. female  PRE-OPERATIVE DIAGNOSIS:  AFib tachbrady   POST-OPERATIVE DIAGNOSIS:  AFib tachbrady   PROCEDURE:  Procedure(s): PERMANENT PACEMAKER INSERTION  SURGEON:  Surgeon(s): Duke Salvia, MD  PHYSICIAN ASSISTANT:   ASSISTANTS: none   ANESTHESIA:   local and IV sedation   DICTATION: .Other Dictation: Dictation Number [id number]  2

## 2011-10-21 NOTE — Op Note (Signed)
NAMEZEYNA, MKRTCHYAN NO.:  [id number]  MEDICAL RECORD NO.:  [id number]  LOCATION:  2011                         FACILITY:  [facility]  PHYSICIAN:  Duke Salvia, MD, FACCDATE OF BIRTH:  [birth year]  DATE OF PROCEDURE:  10/21/2011 DATE OF DISCHARGE:                              OPERATIVE REPORT   PREOPERATIVE DIAGNOSIS:  Sick sinus syndrome.  POSTOPERATIVE DIAGNOSIS:  Sick sinus syndrome.  PROCEDURE:  Dual-chamber pacemaker implantation.  Following obtaining informed consent, the patient was brought to the electrophysiology laboratory and placed on the fluoroscopic table in supine position.  After routine prep and drape of the left upper chest, lidocaine was infiltrated in the prepectoral subclavicular region.  An incision was made and carried down to layer of the prepectoral fascia. Using electrocautery and sharp dissection, a pocket was formed similarly.  Hemostasis was obtained.  Thereafter, attention was turned to gain access to the extrathoracic left subclavian vein, which was accomplished without difficulty without the aspiration of air or with puncture of the artery.  Two separate venipunctures were accomplished.  Guidewires were placed and retained, and sequentially two 7-French sheaths were placed, which were passed a [employer] Jude 2088 TC-52 cm active fixation ventricular lead, serial number [id number] and a 2088 TC-46 lead, serial number [id number] which was to the atrium.  Under fluoroscopic guidance, these were manipulated to the ventricular apex and the right atrial appendage respectively where the bipolar R-wave was 7.2 with a pace impedance of 1096, a threshold 0.5 volts at 0.5 milliseconds.  Current at threshold was 0.5 mA.  There was no diaphragmatic pacing at 10 volts.  The current of injury was brisk. The sensed P-wave was 3.7 with a pace impedance of 529, a threshold 1.4 volts at 0.5 milliseconds.  The current threshold was almost 3 mA. There was  no diaphragmatic pacing at 10 volts.  The current of injury was brisk.  With these acceptable parameters recorded and the ventricular lead having been marked with a tie prior to the insertion of the atrial lead, the leads were attached to a [employer]. Jude Accent DR pulse generator, serial number [id number].  Ventricular pacing was seen.  The pocket was copiously irrigated with antibiotic-containing saline solution.  Hemostasis was assured.  Leads and pulse generator were placed in the pocket and secured to the prepectoral fascia.  The wound was closed in 3 layers in a normal fashion.  The wound was washed, dried, and a Benzoin and Steri-Strip dressing was applied.  Needle counts, sponge counts, and instrument counts were correct at the end of the procedure according to the staff.  The patient tolerated the procedure without apparent complication.     Duke Salvia, MD, [facility]    SCK/MEDQ  D:  10/21/2011  T:  10/21/2011  Job:  [id number]

## 2011-10-21 NOTE — H&P (Signed)
CARDIOLOGY CONSULT NOTE/  H&P   Patient ID: Mikayla Harper, MRN: [id number], DOB/AGE: [birth year] 76 y.o.  Admit date: (Not on file)  Date of Consult: 10/19/2011  Primary Physician: Gaspar Garbe, MD, MD  Primary Cardiologist: [place]  Chief Complaint: Bradycardia and recurrent episodes of dizziness and presyncope  Mrs. Mikayla Harper was seen just recently establish cardiology care. She has had a history of atrial fib and has had past cardioversions x 3 while living in [place] She has been on chronic propafenone. She says she has not had recurrence since 2003. She has had what was felt to be a recent TIA. She has been scheduled for an echo, carotid dopplers and labs. Blood pressure medicine was started, which she did not tolerate. Her blood pressure dropped to 70's systolic. An event monitor was also placed to see if she was having recurrent arrhythmia. This has already showed several pauses ranging from 3.0 to 4.9 seconds. She has not had syncope with these pauses.  She's been unaware of atrial fibrillation.  Thromboembolic risk factors are notable for gender and age and prior TIA. She does has a CHADS-VASc score of 5; she is not currently on anticoagulation. She is on aspirin  Past Medical History   Diagnosis  Date   .  PAF (paroxysmal atrial fibrillation)      on propafenone   .  Hyperlipidemia    .  Carpal tunnel syndrome of left wrist  12/20/2010     left hand   .  Influenza-like illness  2009     H1N1 PCR negative   .  Urinary tract infection    .  Hyperlipidemia      on Mevacor   .  Osteopenia    .  TIA (transient ischemic attack)  Dec 2012   Surgical History:  Past Surgical History   Procedure  Date   .  Back surgery  1976   .  Other surgical history  In the late 1960's     Hysterectomy secondary to fibroids   .  Other surgical history      cataract surgery   Home Meds:  Prior to Admission medications   Medication  Sig  Start Date  End Date  Taking?  Authorizing Provider   calcium  carbonate (OS-CAL) 600 MG TABS  Take 600 mg by mouth 2 (two) times daily with a meal.    Yes  Historical Provider, MD   lovastatin (MEVACOR) 20 MG tablet  Take 1 tablet (20 mg total) by mouth at bedtime.  10/12/11  10/11/12  Yes  Peter [place], MD   oxybutynin (DITROPAN) 5 MG tablet  Take 5 mg by mouth daily.  10/07/11   Yes  Historical Provider, MD   Inpatient Medications:  Allergies:  Allergies   Allergen  Reactions   .  Lisinopril      weakness    History    Social History   .  Marital Status:  Married     Spouse Name:  N/A     Number of Children:  N/A   .  Years of Education:  N/A    Occupational History   .  Not on file.    Social History Main Topics   .  Smoking status:  Former Games developer   .  Smokeless tobacco:  Former Neurosurgeon     Quit date:  10/05/1988   .  Alcohol Use:  No   .  Drug Use:  No   .  Sexually  Active:  No    Other Topics  Concern   .  Not on file    Social History Narrative   .  No narrative on file    Family History   Problem  Relation  Age of Onset   .  Dementia  Mother    .  Emphysema  Father    Review of Systems:  General: negative for chills, fever, night sweats or weight changes.  All other systems reviewed and are otherwise negative except as noted above.  Physical Exam:  Blood pressure 138/82, pulse 60, height 5\' 3"  (1.6 m), weight 160 lb 8 oz (72.802 kg).  General: Well developed, well nourished, in no acute distress.  Head: Normocephalic, atraumatic, sclera non-icteric, no xanthomas, nares are without discharge.  Lymph Nodes: none  Neck: Negative for carotid bruits. JVD not elevated.  Lungs: Clear bilaterally to auscultation without wheezes, rales, or rhonchi. Breathing is unlabored.  Heart: RRR with S1 S2. No murmurs, rubs, or gallops appreciated.  Abdomen: Soft, non-tender, non-distended with normoactive bowel sounds. No hepatomegaly. No rebound/guarding. No obvious abdominal masses.  Msk: Strength and tone appear normal for age.  Extremities:  No clubbing or cyanosis. No edema. Distal pedal pulses are 2+ and equal bilaterally.  Skin: Warm and Dry  Neuro: Alert and oriented X 3. CN III-XII intact Grossly normal sensory and motor function .  Psych: Responds to questions appropriately with a normal affect.   Labs:  CBC  Lab Results   Component  Value  Date    WBC  5.1  10/11/2011    HGB  12.6  10/11/2011    HCT  37.2  10/11/2011    MCV  97.9  10/11/2011    PLT  223.0  10/11/2011    Lab Results   Component  Value  Date    CHOL  195  10/11/2011    HDL  74.50  10/11/2011    LDLCALC  102*  10/11/2011    TRIG  94.0  10/11/2011   Miscellaneous  No results found for this basename: DDIMER   Radiology/Studies:  Ct Head Wo Contrast  10/11/2011 *RADIOLOGY REPORT* Clinical Data: Patient with one episode of garbled speech 2 weeks ago lasting for 10 minutes. This was followed by headache. History of atrial fibrillation and hypertension. CT HEAD WITHOUT CONTRAST Technique: Contiguous axial images were obtained from the base of the skull through the vertex without contrast. Comparison: None. Findings: There is no evidence for acute infarction, intracranial hemorrhage, mass lesion, hydrocephalus, or extra-axial fluid. Mild age appropriate atrophy is noted. There may be slight chronic microvascular ischemic change in the periventricular and subcortical white matter. There are no visible areas of chronic infarction. The calvarium is intact. There is mild carotid siphon calcification. Sinuses and mastoids are essentially clear. There are no orbital abnormalities although the patient appears to have had bilateral cataract extraction. IMPRESSION: Mild age appropriate atrophy and chronic microvascular ischemic change. No visible acute stroke or intracranial mass lesion. Intact calvarium. Original Report Authenticated By: Elsie Stain, M.D.  EKG: Event recorder demonstrated numerous pauses both while awake and while asleep up to 5 seconds long. In addition while  she was here seeing Dr. [place] she had spelled her heart rate went into the 20s. These were primarily sinus pauses as well some posttermination pauses   1. Sinus Pauses - Patient is having recurrent significantly symptomatic pausing with up to 5 seconds documented. She also has heart rates into the 20s documented. Pacing  is clearly indicated for treatment of bradycardia as well as to allow for treatment of her atrial fibrillation. I reviewed with her and her husband the potential benefits as well as risks including but not limited to death perforation of heart lung lead dislodgment infection they understand these risks and are willing to proceed.  We will try to get the echocardiogram prior to the procedure. I presume that her left ventricular function in the past has been normal based on that she is on Rythmol  2. Atrial Fib - As above. She has a CHADS-VASc score of 5 and is a good candidate for anticoagulation. I reviewed with her alternatives. Will anticipate initiation of Rivaroxaban following pacemaker implantation. I've asked her to discontinue her aspirin.  ADDENDUM: Echo performed 10-20-2011  Results: Study Conclusions - Left ventricle: The cavity size was mildly dilated. There was mild focal basal hypertrophy of the septum. The estimated ejection fraction was 55%. Wall motion was normal; there were no regional wall motion abnormalities. Doppler parameters are consistent with abnormal left ventricular relaxation (grade 1 diastolic dysfunction). - Aortic valve: Mild regurgitation.   Dr Odessa Fleming note above reviewed. Pt here today for PPM. She is not having any chest pain, SOB, presyncope or syncope. Asked about concerns/questions regarding procedure - there were none. OK for PPM.   See and agree

## 2011-10-22 ENCOUNTER — Other Ambulatory Visit: Payer: Self-pay

## 2011-10-22 ENCOUNTER — Ambulatory Visit (HOSPITAL_COMMUNITY): Payer: Medicare Other

## 2011-10-22 ENCOUNTER — Encounter (HOSPITAL_COMMUNITY): Payer: Self-pay | Admitting: Nurse Practitioner

## 2011-10-22 DIAGNOSIS — I495 Sick sinus syndrome: Secondary | ICD-10-CM | POA: Insufficient documentation

## 2011-10-22 MED ORDER — RIVAROXABAN 15 MG PO TABS: 15.0000 mg | ORAL_TABLET | Freq: Every day | ORAL | Status: DC

## 2011-10-22 NOTE — Progress Notes (Signed)
Pt. Discharged 10/22/2011  12:39 PM Discharge instructions reviewed with patient/family. Patient/family verbalized understanding. All Rx's given. Questions answered as needed. Pt. Discharged to home with family/self. Taken off unit via W/C. Elease Hashimoto RN

## 2011-10-22 NOTE — Progress Notes (Signed)
Patient ID: Mikayla Harper, female   DOB: 1929-10-31, 78 y.o.   MRN: [id number] @ Subjective:  Denies SSCP, palpitations or Dyspnea   Objective:  Filed Vitals:   10/21/11 1730 10/21/11 1926 10/21/11 2130 10/22/11 0347  BP: 118/74 150/75 120/74 154/84  Pulse: 70 81 73 84  Temp:   98.3 F (36.8 C) 98.5 F (36.9 C)  TempSrc:   Oral Oral  Resp: 18 18 18 19   Height:      Weight:      SpO2: 95%  95% 95%    Intake/Output from previous day:  Intake/Output Summary (Last 24 hours) at 10/22/11 0946 Last data filed at 10/22/11 0800  Gross per 24 hour  Intake 2628.33 ml  Output    600 ml  Net 2028.33 ml    Physical Exam: Affect appropriate Healthy:  appears stated age HEENT: normal Neck supple with no adenopathy JVP normal no bruits no thyromegaly Lungs clear with no wheezing and good diaphragmatic motion Heart:  S1/S2 no murmur, no rub, gallop or click PMI normal Abdomen: benighn, BS positve, no tenderness, no AAA no bruit.  No HSM or HJR Distal pulses intact with no bruits No edema Neuro non-focal Skin warm and dry No muscular weakness Pacer pocket under left clavicle A  Lab Results: Basic Metabolic Panel:  Basename 10/20/11 1403  NA 139  K 4.6  CL 104  CO2 29  GLUCOSE 98  BUN 21  CREATININE 1.0  CALCIUM 9.6  MG --  PHOS --   Liver Function Tests: No results found for this basename: AST:2,ALT:2,ALKPHOS:2,BILITOT:2,PROT:2,ALBUMIN:2 in the last 72 hours No results found for this basename: LIPASE:2,AMYLASE:2 in the last 72 hours CBC:  Basename 10/20/11 1403  WBC 5.8  NEUTROABS 3.1  HGB 12.6  HCT 37.4  MCV 97.2  PLT 230.0   Cardiac Enzymes: No results found for this basename: CKTOTAL:3,CKMB:3,CKMBINDEX:3,TROPONINI:3 in the last 72 hours BNP: No components found with this basename: POCBNP:3 D-Dimer: No results found for this basename: DDIMER:2 in the last 72 hours Hemoglobin A1C: No results found for this basename: HGBA1C in the last 72 hours Fasting  Lipid Panel: No results found for this basename: CHOL,HDL,LDLCALC,TRIG,CHOLHDL,LDLDIRECT in the last 72 hours Thyroid Function Tests: No results found for this basename: TSH,T4TOTAL,FREET3,T3FREE,THYROIDAB in the last 72 hours Anemia Panel: No results found for this basename: VITAMINB12,FOLATE,FERRITIN,TIBC,IRON,RETICCTPCT in the last 72 hours  Imaging: Dg Chest 2 View  10/22/2011  *RADIOLOGY REPORT*  Clinical Data: Pacemaker placement  CHEST - 2 VIEW  Comparison: 08/08/2008; chest CT - 07/30/2008  Findings:  Unchanged cardiac silhouette and mediastinal contours.  Interval placement of a left anterior chest wall dual lead pacemaker with lead tips projected over the right atrium and ventricle. The lungs remain hyperexpanded.  No new focal airspace opacities.  No pleural effusion or pneumothorax.  Grossly unchanged bones.  IMPRESSION: 1.  Interval placement of left anterior chest wall dual lead pacemaker without evidence of complication. 2.  Stable sequela of prior granulomatous infection.  Original Report Authenticated By: Waynard Reeds, M.D.    Cardiac Studies:  ECG:  1/18  SB rate 56     Telemetry:  SR Vpacing  Echo:   Medications:     . ceFAZolin      .  ceFAZolin (ANCEF) IV  1 g Intravenous Q6H  . fentaNYL      . heparin      . lidocaine      . midazolam      .  mupirocin ointment   Nasal BID  . mupirocin ointment      . oxybutynin  5 mg Oral Daily  . simvastatin  5 mg Oral q1800  . DISCONTD:  ceFAZolin (ANCEF) IV  1 g Intravenous On Call  . DISCONTD: chlorhexidine  60 mL Topical Once  . DISCONTD: gentamicin irrigation  80 mg Irrigation On Call       . sodium chloride 50 mL/hr at 10/21/11 1614  . DISCONTD: sodium chloride 50 mL/hr at 10/21/11 1316  . DISCONTD: sodium chloride      Assessment/Plan:  Tachybrady:  S/P pacer CXR looks fine with old granulomatous disease no pneumo.  Incision fine D/C home  F/U SK 3-4 weeks Chol:  Continue statin  Charlton Haws 10/22/2011,  9:46 AM

## 2011-10-22 NOTE — Discharge Summary (Signed)
Patient ID: Mikayla Harper,  MRN: [id number], DOB/AGE: September 06, 1930 78 y.o.  Admit date: 10/21/2011 Discharge date: 10/22/2011  Primary Care Provider: R. Tisovec  Primary Cardiologist: Felecia Jan  Discharge Diagnoses Principal Problem:  *Sick sinus syndrome Active Problems:  HYPERLIPIDEMIA  Atrial fibrillation  HTN (hypertension)   Allergies Allergies  Allergen Reactions  . Lisinopril Other (See Comments)    Dizzy and lightheadedness    Procedures  10/21/2011 - PPM Placement St. Jude Accent DR pulse generator, serial number [id number]  History of Present Illness  78 y/o female with the above problem list who recently underwent outpt monitoring secondary to episodic dizziness and presyncope.  This revealed sinus pauses ranging between 3 and 4.9 seconds.  She was subsequently seen by Dr. Graciela Husbands on 1/23 and decision was made to pursue PPM placement.  An echo was performed on 1/24 and showed NL LV function.  Hospital Course   Pt presented to the [facility] EP lab on 1/25.  She underwent successful placement of a St. Jude Accent dual chamber ppm.  She tolerated procedure well and post-procedure has been ambulating w/o recurrent Ss or limitations.  CXR show no evidence of ptx.  She will be d/c today in good condition.  Of note, 2/2 a CHADS2 score of 5, we have provided pt w/ a Rx for Rivaroxaban 15mg  (creat clearance 50) daily, as was previously planned by Dr. Graciela Husbands.  We advised that she begin this on 1/30.  Discharge Vitals:  Blood pressure 154/84, pulse 84, temperature 98.5 F (36.9 C), temperature source Oral, resp. rate 19, height 5\' 3"  (1.6 m), weight 156 lb (70.761 kg), SpO2 95.00%.   Labs: CBC:  Basename 10/20/11 1403  WBC 5.8  NEUTROABS 3.1  HGB 12.6  HCT 37.4  MCV 97.2  PLT 230.0   Basic Metabolic Panel:  Basename 10/20/11 1403  NA 139  K 4.6  CL 104  CO2 29  GLUCOSE 98  BUN 21  CREATININE 1.0  CALCIUM 9.6  MG --  PHOS --    Disposition:  Follow-up  Information    Follow up with [facility]. (approx 10 days.  We will arrange.)    Contact information:   [address] [place], [place] [phone]      Follow up with Peter [place], MD. (As needed)    Contact information:   [address] [place] [address] [phone]       Follow up with Sherryl Manges, MD. (approx 3 months - we will arrange)    Contact information:   [address], [address] [place] [address] [phone]       Follow up with Gaspar Garbe, MD. (As needed)    Contact information:   [address] [facility], [place]. [place] [address] [phone]          Discharge Medications:  Medication List  As of 10/22/2011 12:26 PM   TAKE these medications         calcium carbonate 600 MG Tabs   Commonly known as: OS-CAL   Take 600 mg by mouth 2 (two) times daily with a meal.      lovastatin 20 MG tablet   Commonly known as: MEVACOR   Take 20 mg by mouth at bedtime.      oxybutynin 5 MG tablet   Commonly known as: DITROPAN   Take 5 mg by mouth daily.  Rivaroxaban 15 MG Tabs tablet - TO BE STARTED ON 1/30   Commonly known as: XARELTO   Take 1 tablet (15 mg total) by mouth daily.            Outstanding Labs/Studies  None  Duration of Discharge Encounter: Greater than 30 minutes including physician time.  Signed, Nicolasa Ducking NP 10/22/2011, 12:27 PM

## 2011-10-27 ENCOUNTER — Other Ambulatory Visit (HOSPITAL_COMMUNITY): Payer: Medicare Other | Admitting: Radiology

## 2011-10-28 ENCOUNTER — Encounter: Payer: Medicare Other | Admitting: *Deleted

## 2011-11-02 ENCOUNTER — Ambulatory Visit: Payer: Medicare Other | Admitting: *Deleted

## 2011-11-02 ENCOUNTER — Ambulatory Visit (INDEPENDENT_AMBULATORY_CARE_PROVIDER_SITE_OTHER): Payer: Medicare Other | Admitting: *Deleted

## 2011-11-02 ENCOUNTER — Encounter: Payer: Self-pay | Admitting: Internal Medicine

## 2011-11-02 ENCOUNTER — Encounter (INDEPENDENT_AMBULATORY_CARE_PROVIDER_SITE_OTHER): Payer: Medicare Other | Admitting: Cardiology

## 2011-11-02 DIAGNOSIS — G459 Transient cerebral ischemic attack, unspecified: Secondary | ICD-10-CM

## 2011-11-02 DIAGNOSIS — I495 Sick sinus syndrome: Secondary | ICD-10-CM

## 2011-11-02 DIAGNOSIS — I4891 Unspecified atrial fibrillation: Secondary | ICD-10-CM

## 2011-11-02 DIAGNOSIS — E785 Hyperlipidemia, unspecified: Secondary | ICD-10-CM

## 2011-11-02 DIAGNOSIS — R42 Dizziness and giddiness: Secondary | ICD-10-CM

## 2011-11-02 DIAGNOSIS — I10 Essential (primary) hypertension: Secondary | ICD-10-CM

## 2011-11-02 LAB — PACEMAKER DEVICE OBSERVATION
AL AMPLITUDE: 5 mv
BATTERY VOLTAGE: 3.023 V
DEVICE MODEL PM: 7300824
RV LEAD AMPLITUDE: 6.4 mv
RV LEAD IMPEDENCE PM: 512.5 Ohm
VENTRICULAR PACING PM: 1

## 2011-11-02 NOTE — Progress Notes (Signed)
Wound check pacer in clinic  

## 2011-11-08 ENCOUNTER — Other Ambulatory Visit: Payer: Self-pay

## 2011-11-08 DIAGNOSIS — I6523 Occlusion and stenosis of bilateral carotid arteries: Secondary | ICD-10-CM

## 2011-11-17 ENCOUNTER — Ambulatory Visit (INDEPENDENT_AMBULATORY_CARE_PROVIDER_SITE_OTHER): Payer: Medicare Other | Admitting: Cardiology

## 2011-11-17 ENCOUNTER — Encounter: Payer: Self-pay | Admitting: Cardiology

## 2011-11-17 VITALS — BP 118/84 | HR 64 | Ht 62.0 in | Wt 163.4 lb

## 2011-11-17 DIAGNOSIS — I495 Sick sinus syndrome: Secondary | ICD-10-CM

## 2011-11-17 DIAGNOSIS — Z7901 Long term (current) use of anticoagulants: Secondary | ICD-10-CM

## 2011-11-17 DIAGNOSIS — I4891 Unspecified atrial fibrillation: Secondary | ICD-10-CM

## 2011-11-17 NOTE — Progress Notes (Signed)
   [name]. Piazza Date of Birth: 12-Sep-1930 Medical Record #[id number]  History of Present Illness: Mikayla Harper is seen today for a followup visit. She underwent successful implantation of a dual-chamber pacemaker for sick sinus syndrome with prolonged pauses. She states she is feeling much better. She's had no dizziness or syncope. She denies any recurrent TIA symptoms. She is now on Xarelto for anticoagulation. Her pacemaker site is healing well. Recent pacemaker interrogation revealed 2 mode switches the longest lasting one hour and 40 minutes. There were no ventricular high rate episodes. Pacemaker function was normal.  Current Outpatient Prescriptions on File Prior to Visit  Medication Sig Dispense Refill  . calcium carbonate (OS-CAL) 600 MG TABS Take 600 mg by mouth 2 (two) times daily with a meal.      . lovastatin (MEVACOR) 20 MG tablet Take 20 mg by mouth at bedtime.      Marland Kitchen oxybutynin (DITROPAN) 5 MG tablet Take 5 mg by mouth daily.       . Rivaroxaban (XARELTO) 15 MG TABS tablet Take 1 tablet (15 mg total) by mouth daily.  30 tablet  6    Allergies  Allergen Reactions  . Lisinopril Other (See Comments)    Dizzy and lightheadedness    Past Medical History  Diagnosis Date  . PAF (paroxysmal atrial fibrillation)     on propafenone  . Hyperlipidemia   . Carpal tunnel syndrome of left wrist 12/20/2010    left hand  . Influenza-like illness 2009    H1N1 PCR negative  . Urinary tract infection   . Hyperlipidemia     on Mevacor  . Osteopenia   . TIA (transient ischemic attack) Dec 2012  . Tuberculosis      hx of over 50 years ago "  . Sick sinus syndrome     a.  10/21/11 s/p St. Jude Accent DR dual chamber device    Past Surgical History  Procedure Date  . Back surgery 1976  . Other surgical history In the late 1960's    Hysterectomy secondary to fibroids  . Other surgical history     cataract surgery  . Abdominal hysterectomy   . Insert / replace / remove pacemaker  10/21/2011    History  Smoking status  . Former Smoker  Smokeless tobacco  . Former Neurosurgeon  . Quit date: 10/05/1988    History  Alcohol Use No    Family History  Problem Relation Age of Onset  . Dementia Mother   . Emphysema Father     Review of Systems: The review of systems is per the HPI.  All other systems were reviewed and are negative.  Physical Exam: BP 118/84  Pulse 64  Ht 5\' 2"  (1.575 m)  Wt 74.118 kg (163 lb 6.4 oz)  BMI 29.89 kg/m2 Patient is very pleasant and in no acute distress. Skin is warm and dry. Color is normal.  HEENT is unremarkable. Normocephalic/atraumatic. PERRL. Sclera are nonicteric. Neck is supple. No masses. No JVD. Lungs are clear. Cardiac exam shows a regular rate and rhythm. Her pacemaker site has healed well.  Abdomen is soft. Extremities are without edema. Gait and ROM are intact. No gross neurologic deficits noted.   LABORATORY DATA:   Assessment / Plan:

## 2011-11-17 NOTE — Patient Instructions (Signed)
Continue your current medication  I will see you again in 4 months.   

## 2011-11-17 NOTE — Progress Notes (Signed)
Addended by: Meda Klinefelter D on: 11/17/2011 10:37 AM   Modules accepted: Orders

## 2011-11-17 NOTE — Assessment & Plan Note (Signed)
She is status post dual-chamber pacemaker implant. She is asymptomatic. She has had 2 mode switches but no significant ventricular high rate episodes. She is currently on no AV nodal agents or antiarrhythmics. We will continue to monitor closely. She will need to stay on chronic anticoagulation. Based on her renal function her Xarelto dose is 15 mg. I will see her back for followup in 4 months. We will recheck her renal function at that time.

## 2011-11-18 ENCOUNTER — Institutional Professional Consult (permissible substitution): Payer: Medicare Other | Admitting: Internal Medicine

## 2011-11-19 ENCOUNTER — Encounter: Payer: Self-pay | Admitting: Cardiology

## 2012-01-24 ENCOUNTER — Ambulatory Visit (INDEPENDENT_AMBULATORY_CARE_PROVIDER_SITE_OTHER): Payer: Medicare Other | Admitting: Internal Medicine

## 2012-01-24 ENCOUNTER — Encounter: Payer: Self-pay | Admitting: Internal Medicine

## 2012-01-24 VITALS — BP 110/70 | HR 68 | Ht 64.0 in | Wt 162.8 lb

## 2012-01-24 DIAGNOSIS — Z950 Presence of cardiac pacemaker: Secondary | ICD-10-CM

## 2012-01-24 DIAGNOSIS — G4700 Insomnia, unspecified: Secondary | ICD-10-CM

## 2012-01-24 DIAGNOSIS — I4891 Unspecified atrial fibrillation: Secondary | ICD-10-CM

## 2012-01-24 DIAGNOSIS — I495 Sick sinus syndrome: Secondary | ICD-10-CM

## 2012-01-24 LAB — PACEMAKER DEVICE OBSERVATION
ATRIAL PACING PM: 24
BAMS-0001: 150 {beats}/min
BAMS-0003: 70 {beats}/min
BATTERY VOLTAGE: 2.9779 V
DEVICE MODEL PM: 7300824
RV LEAD THRESHOLD: 0.875 V
VENTRICULAR PACING PM: 1.2

## 2012-01-24 NOTE — Assessment & Plan Note (Signed)
As above.

## 2012-01-24 NOTE — Progress Notes (Signed)
  HPI  Mikayla Harper is a 78 y.o. female Seen in followup for a pacemaker implanted January 2013 for tachybradycardia syndrome with paroxysmal atrial fibrillation as well as prolonged pauses.  Since then she complains of insomnia. Rivaroxaban was started at that time.   She has had a history of atrial fib and has had past cardioversions x 3 while living in [place] She has been on chronic propafenone. She says she has not had recurrence since 2003. She has had what was felt to be a recent TIA. She has been scheduled for an echo, carotid dopplers and labs.  She's been unaware of atrial fibrillation.  Thromboembolic risk factors are notable for gender and age and prior TIA. She does has a CHADS-VASc score of 5;    Past Medical History  Diagnosis Date  . PAF (paroxysmal atrial fibrillation)     on propafenone  . Hyperlipidemia   . Carpal tunnel syndrome of left wrist 12/20/2010    left hand  . Influenza-like illness 2009    H1N1 PCR negative  . Urinary tract infection   . Hyperlipidemia     on Mevacor  . Osteopenia   . TIA (transient ischemic attack) Dec 2012  . Tuberculosis      hx of over 50 years ago "  . Sick sinus syndrome     a.  10/21/11 s/p St. Jude Accent DR dual chamber device    Past Surgical History  Procedure Date  . Back surgery 1976  . Other surgical history In the late 1960's    Hysterectomy secondary to fibroids  . Other surgical history     cataract surgery  . Abdominal hysterectomy   . Insert / replace / remove pacemaker 10/21/2011    Current Outpatient Prescriptions  Medication Sig Dispense Refill  . calcium carbonate (OS-CAL) 600 MG TABS Take 600 mg by mouth 2 (two) times daily with a meal.      . lovastatin (MEVACOR) 20 MG tablet Take 20 mg by mouth at bedtime.      Marland Kitchen oxybutynin (DITROPAN) 5 MG tablet Take 5 mg by mouth daily.       . Rivaroxaban (XARELTO) 15 MG TABS tablet Take 1 tablet (15 mg total) by mouth daily.  30 tablet  6  . temazepam  (RESTORIL) 15 MG capsule Take 15 mg by mouth as needed.        Allergies  Allergen Reactions  . Lisinopril Other (See Comments)    Dizzy and lightheadedness    Review of Systems negative except from HPI and PMH  Physical Exam BP 110/70  Pulse 68  Ht 5\' 4"  (1.626 m)  Wt 162 lb 12.8 oz (73.846 kg)  BMI 27.94 kg/m2 Well developed and well nourished in no acute distress HENT normal E scleral and icterus clear Neck Supple JVP flat; carotids brisk and full Clear to ausculation Device pocket well-healed regular rate and rhythm, no murmurs gallops or rub Soft with active bowel sounds No clubbing cyanosis none Edema Alert and oriented, grossly normal motor and sensory function Skin Warm and Dry   Assessment and  Plan

## 2012-01-24 NOTE — Assessment & Plan Note (Signed)
She dates this to the onset of Rivaroxaban and looking it up, one out of 200 patient's as this complaint.  We will undertake a trial of dabigotran and see if the insomnia resolves. Will have her follow up in 4 weeks with LG.

## 2012-01-24 NOTE — Assessment & Plan Note (Signed)
She is paced currently 24% of the time. She has had insignificant atrial fibrillation since February

## 2012-01-24 NOTE — Patient Instructions (Signed)
Your physician has recommended you make the following change in your medication:  1) Stop xarelto 2) Start pradaxa 150 mg one capsule by mouth twice daily.  Your physician recommends that you schedule a follow-up appointment in: 4 weeks with Norma Fredrickson, NP.  Your physician wants you to follow-up in: January 2014 with Dr. Graciela Husbands. You will receive a reminder letter in the mail two months in advance. If you don't receive a letter, please call our office to schedule the follow-up appointment.

## 2012-02-23 ENCOUNTER — Encounter: Payer: Self-pay | Admitting: Nurse Practitioner

## 2012-02-23 ENCOUNTER — Ambulatory Visit (INDEPENDENT_AMBULATORY_CARE_PROVIDER_SITE_OTHER): Payer: Medicare Other | Admitting: Nurse Practitioner

## 2012-02-23 VITALS — BP 128/82 | HR 70 | Ht 63.0 in | Wt 159.0 lb

## 2012-02-23 DIAGNOSIS — G4700 Insomnia, unspecified: Secondary | ICD-10-CM

## 2012-02-23 DIAGNOSIS — I4891 Unspecified atrial fibrillation: Secondary | ICD-10-CM

## 2012-02-23 NOTE — Assessment & Plan Note (Signed)
She has PAF. On anticoagulation. Currently doing very well. On no AV nodal blocking agents. Will continue to monitor. We will see her back in about 3 months since she is doing well and cancel her appointment in 3 weeks. Patient is agreeable to this plan and will call if any problems develop in the interim.

## 2012-02-23 NOTE — Patient Instructions (Addendum)
Stay on your current medicines.  We will see you back in 3 months. We will cancel your June appointment  Call the [facility] office at [phone] if you have any questions, problems or concerns.

## 2012-02-23 NOTE — Progress Notes (Signed)
Mikayla Harper. Seman Date of Birth: August 13, 1930 Medical Record #[id number]  History of Present Illness: Mikayla Harper is seen today for a 4 week check. She is seen at the request of Dr. Graciela Husbands for Dr. Swaziland. She has PAF, chronic anticoagulation and prior PTVP implant back in January. She has been on anti-arrhythmic therapy in the past.  She comes in today. She is doing well. She was here a month ago for her pacemaker check. She complained on insomnia with her Xarelto. Dr. Graciela Husbands gave her a trial of Pradaxa. This did not help. She went back to her Xarelto and started taking it in the morning. She has since slept just fine. Her morning meal is quite large and she basically only eats 2 meals a day. Her 2nd meal tends to be lighter. She is not having chest pain. Not short of breath. Overall, she feels good. She does note occasional burning in her left shoulder, worse with turning over. No problems with her left arm.   Current Outpatient Prescriptions on File Prior to Visit  Medication Sig Dispense Refill  . calcium carbonate (OS-CAL) 600 MG TABS Take 600 mg by mouth 2 (two) times daily with a meal.      . lovastatin (MEVACOR) 20 MG tablet Take 20 mg by mouth at bedtime.      Marland Kitchen oxybutynin (DITROPAN) 5 MG tablet Take 5 mg by mouth daily.       . temazepam (RESTORIL) 15 MG capsule Take 15 mg by mouth as needed.        Allergies  Allergen Reactions  . Lisinopril Other (See Comments)    Dizzy and lightheadedness    Past Medical History  Diagnosis Date  . PAF (paroxysmal atrial fibrillation)     previously on propafenone  . Hyperlipidemia   . Carpal tunnel syndrome of left wrist 12/20/2010    left hand  . Influenza-like illness 2009    H1N1 PCR negative  . Urinary tract infection   . Hyperlipidemia     on Mevacor  . Osteopenia   . TIA (transient ischemic attack) Dec 2012  . Tuberculosis      hx of over 50 years ago "  . Sick sinus syndrome   . Pacemaker - [facility]     Implant Jan 2013     Past Surgical History  Procedure Date  . Back surgery 1976  . Other surgical history In the late 1960's    Hysterectomy secondary to fibroids  . Other surgical history     cataract surgery  . Abdominal hysterectomy   . Insert / replace / remove pacemaker 10/21/2011    History  Smoking status  . Former Smoker  Smokeless tobacco  . Former Neurosurgeon  . Quit date: 10/05/1988    History  Alcohol Use No    Family History  Problem Relation Age of Onset  . Dementia Mother   . Emphysema Father     Review of Systems: The review of systems is per the HPI.  All other systems were reviewed and are negative.  Physical Exam: BP 128/82  Pulse 70  Ht 5\' 3"  (1.6 m)  Wt 159 lb (72.122 kg)  BMI 28.17 kg/m2 Patient is very pleasant and in no acute distress. Skin is warm and dry. Color is normal.  HEENT is unremarkable. Normocephalic/atraumatic. PERRL. Sclera are nonicteric. Neck is supple. No masses. No JVD. Lungs are clear. She does have some palpable left shoulder pain. Cardiac exam shows a regular rate  and rhythm. Pacemaker in the left upper chest looks good. Left arm looks ok. Abdomen is soft. Extremities are without edema. Gait and ROM are intact. No gross neurologic deficits noted.   LABORATORY DATA: N/A   Assessment / Plan:

## 2012-02-23 NOTE — Assessment & Plan Note (Signed)
This has improved with taking Xarelto in the morning.

## 2012-03-15 ENCOUNTER — Other Ambulatory Visit: Payer: Medicare Other

## 2012-03-15 ENCOUNTER — Ambulatory Visit: Payer: Medicare Other | Admitting: Cardiology

## 2012-05-18 ENCOUNTER — Encounter: Payer: Self-pay | Admitting: Cardiology

## 2012-05-18 ENCOUNTER — Ambulatory Visit (INDEPENDENT_AMBULATORY_CARE_PROVIDER_SITE_OTHER): Payer: Medicare Other | Admitting: Cardiology

## 2012-05-18 VITALS — BP 128/76 | HR 81 | Ht 63.0 in | Wt 166.8 lb

## 2012-05-18 DIAGNOSIS — I495 Sick sinus syndrome: Secondary | ICD-10-CM

## 2012-05-18 DIAGNOSIS — Z7901 Long term (current) use of anticoagulants: Secondary | ICD-10-CM

## 2012-05-18 DIAGNOSIS — I4891 Unspecified atrial fibrillation: Secondary | ICD-10-CM

## 2012-05-18 NOTE — Progress Notes (Signed)
Mikayla Harper. Chamberlain Date of Birth: 02-19-1930 Medical Record #[id number]  History of Present Illness: Mikayla Harper is seen today for a followup visit. She is feeling very well. She denies any symptoms of palpitations or tachycardia. She's had no dizziness or syncope. She denies any chest pain or shortness of breath. She previously had problems with insomnia related to itching. She was seen by dermatology and started on hydroxyzine. Since then her itching is much better and she is sleeping better.  Current Outpatient Prescriptions on File Prior to Visit  Medication Sig Dispense Refill  . calcium carbonate (OS-CAL) 600 MG TABS Take 600 mg by mouth 2 (two) times daily with a meal.      . lovastatin (MEVACOR) 20 MG tablet Take 20 mg by mouth at bedtime.      Marland Kitchen oxybutynin (DITROPAN) 5 MG tablet Take 5 mg by mouth daily.       . Rivaroxaban (XARELTO) 15 MG TABS tablet Take 15 mg by mouth daily.      . temazepam (RESTORIL) 15 MG capsule Take 15 mg by mouth as needed.        Allergies  Allergen Reactions  . Lisinopril Other (See Comments)    Dizzy and lightheadedness    Past Medical History  Diagnosis Date  . PAF (paroxysmal atrial fibrillation)     previously on propafenone  . Hyperlipidemia   . Carpal tunnel syndrome of left wrist 12/20/2010    left hand  . Influenza-like illness 2009    H1N1 PCR negative  . Urinary tract infection   . Hyperlipidemia     on Mevacor  . Osteopenia   . TIA (transient ischemic attack) Dec 2012  . Tuberculosis      hx of over 50 years ago "  . Sick sinus syndrome   . Pacemaker - [facility]     Implant Jan 2013    Past Surgical History  Procedure Date  . Back surgery 1976  . Other surgical history In the late 1960's    Hysterectomy secondary to fibroids  . Other surgical history     cataract surgery  . Abdominal hysterectomy   . Insert / replace / remove pacemaker 10/21/2011    History  Smoking status  . Former Smoker  Smokeless tobacco  .  Former Neurosurgeon  . Quit date: 10/05/1988    History  Alcohol Use No    Family History  Problem Relation Age of Onset  . Dementia Mother   . Emphysema Father     Review of Systems: The review of systems is per the HPI.  All other systems were reviewed and are negative.  Physical Exam: BP 128/76  Pulse 81  Ht 5\' 3"  (1.6 m)  Wt 166 lb 12.8 oz (75.66 kg)  BMI 29.55 kg/m2  SpO2 98% Patient is very pleasant and in no acute distress. Skin is warm and dry. Color is normal.  HEENT is unremarkable. Normocephalic/atraumatic. PERRL. Sclera are nonicteric. Neck is supple. No masses. No JVD. Lungs are clear. She does have some palpable left shoulder pain. Cardiac exam shows a regular rate and rhythm. Pacemaker in the left upper chest looks good. Left arm looks ok. Abdomen is soft. Extremities are without edema. Gait and ROM are intact. No gross neurologic deficits noted.   LABORATORY DATA:    Assessment / Plan: 1. Paroxysmal atrial fibrillation. She does not appear to be having any high rate episodes. She is on chronic anticoagulation was Xarelto. She is status  post DDD pacemaker implant for sick sinus syndrome. We will continue with her current therapy and followup again in 6 months.

## 2012-05-18 NOTE — Patient Instructions (Signed)
Continue your current therapy  I will see you again in 6 months.   

## 2012-07-17 ENCOUNTER — Other Ambulatory Visit (HOSPITAL_COMMUNITY): Payer: Self-pay | Admitting: *Deleted

## 2012-07-18 ENCOUNTER — Encounter (HOSPITAL_COMMUNITY)
Admission: RE | Admit: 2012-07-18 | Discharge: 2012-07-18 | Disposition: A | Discharge status: Home / Self Care | Payer: Medicare Other | Source: Ambulatory Visit | Attending: Internal Medicine | Admitting: Internal Medicine

## 2012-07-18 DIAGNOSIS — D649 Anemia, unspecified: Secondary | ICD-10-CM | POA: Insufficient documentation

## 2012-07-18 LAB — ABO/RH: ABO/RH(D): O POS

## 2012-07-19 ENCOUNTER — Encounter (HOSPITAL_COMMUNITY)
Admission: RE | Admit: 2012-07-19 | Discharge: 2012-07-19 | Disposition: A | Discharge status: Home / Self Care | Payer: Medicare Other | Source: Ambulatory Visit | Attending: Internal Medicine | Admitting: Internal Medicine

## 2012-07-19 MED ORDER — FUROSEMIDE 10 MG/ML IJ SOLN
10.0000 mg | Freq: Once | INTRAMUSCULAR | Status: AC
  Administered 2012-07-19: 10 mg via INTRAVENOUS
  Filled 2012-07-19 (×3): qty 1

## 2012-07-19 MED ORDER — SODIUM CHLORIDE 0.9 % IV SOLN
INTRAVENOUS | Status: DC
  Administered 2012-07-19: 12:00:00 via INTRAVENOUS

## 2012-07-19 NOTE — Progress Notes (Signed)
Lungs clear to auscultation bilaterally. Respirations even and unlabored.

## 2012-07-20 LAB — TYPE AND SCREEN
ABO/RH(D): O POS
Antibody Screen: NEGATIVE
Unit division: 0

## 2012-07-24 ENCOUNTER — Encounter: Payer: Self-pay | Admitting: Gastroenterology

## 2012-07-27 ENCOUNTER — Ambulatory Visit (INDEPENDENT_AMBULATORY_CARE_PROVIDER_SITE_OTHER): Payer: Medicare Other | Admitting: Gastroenterology

## 2012-07-27 ENCOUNTER — Encounter: Payer: Self-pay | Admitting: Gastroenterology

## 2012-07-27 ENCOUNTER — Other Ambulatory Visit: Payer: Medicare Other

## 2012-07-27 VITALS — BP 132/84 | HR 84 | Ht 63.75 in | Wt 173.4 lb

## 2012-07-27 DIAGNOSIS — D509 Iron deficiency anemia, unspecified: Secondary | ICD-10-CM

## 2012-07-27 MED ORDER — PEG-KCL-NACL-NASULF-NA ASC-C 100 G PO SOLR: 1.0000 | Freq: Once | ORAL | Status: DC

## 2012-07-27 NOTE — Progress Notes (Signed)
HPI: This is a     very pleasant 78-year-old woman whom I am meeting for the first time today   She has not seen any visible.  No gi or other. No black colored stools.   Was put on iron last week  When Hb came back 7.3, mcv 74.  Received 2 units blood last, and hemoglobin bump to 9.8,   She was Hemoccult-negative last week   Had colonoscopy between 5-10 years ago in [place] it was normal.  No polyps, no cancer.  Never had EGD.  No nsaids.  Only tylenol.    Has gained 10 pounds in 2013.  Was on xarelto, stopped 1 week ago due to anemia.   Review of systems: Pertinent positive and negative review of systems were noted in the above HPI section. Complete review of systems was performed and was otherwise normal.    Past Medical History  Diagnosis Date  . PAF (paroxysmal atrial fibrillation)     previously on propafenone  . Carpal tunnel syndrome of left wrist 12/20/2010    left hand  . Influenza-like illness 2009    H1N1 PCR negative  . Urinary tract infection   . Hyperlipidemia     on Mevacor  . Osteopenia   . TIA (transient ischemic attack) Dec 2012  . Tuberculosis      hx of over 50 years ago "  . Sick sinus syndrome   . Pacemaker - [facility]     Implant Jan 2013  . Anemia     Past Surgical History  Procedure Date  . Back surgery 1976  . Other surgical history In the late 1960's    Hysterectomy secondary to fibroids  . Other surgical history     cataract surgery  . Abdominal hysterectomy   . Insert / replace / remove pacemaker 10/21/2011    Current Outpatient Prescriptions  Medication Sig Dispense Refill  . Multiple Vitamins-Minerals (WOMENS MULTIVITAMIN PLUS PO) Take 1 tablet by mouth daily.      . lovastatin (MEVACOR) 20 MG tablet Take 20 mg by mouth at bedtime.      . oxybutynin (DITROPAN) 5 MG tablet Take 5 mg by mouth daily.       . temazepam (RESTORIL) 15 MG capsule Take 15 mg by mouth as needed.        Allergies as of 07/27/2012 - Review Complete  07/27/2012  Allergen Reaction Noted  . Lisinopril Other (See Comments) 10/14/2011    Family History  Problem Relation Age of Onset  . Dementia Mother   . Emphysema Father   . Heart disease Father   . Lymphoma Daughter     in bone    History   Social History  . Marital Status: Married    Spouse Name: N/A    Number of Children: 2  . Years of Education: N/A   Occupational History  . retired2    Social History Main Topics  . Smoking status: Former Smoker    Types: Cigarettes    Quit date: 10/05/1988  . Smokeless tobacco: Never Used  . Alcohol Use: No  . Drug Use: No  . Sexually Active: No   Other Topics Concern  . Not on file   Social History Narrative  . No narrative on file       Physical Exam: BP 132/84  Pulse 84  Ht 5' 3.75" (1.619 m)  Wt 173 lb 6 oz (78.642 kg)  BMI 29.99 kg/m2 Constitutional: generally well-appearing Psychiatric: alert   and oriented x3 Eyes: extraocular movements intact Mouth: oral pharynx moist, no lesions Neck: supple no lymphadenopathy Cardiovascular: heart regular rate and rhythm Lungs: clear to auscultation bilaterally Abdomen: soft, nontender, nondistended, no obvious ascites, no peritoneal signs, normal bowel sounds Extremities: no lower extremity edema bilaterally Skin: no lesions on visible extremities    Assessment and plan: 78 y.o. female with  iron deficiency anemia rather significant, Hemoccult negative  it is not clear if she is bleeding from a GI source. She is however significantly iron deficient and anemic to the level of 7.3 last week. She has been transfused and we'll proceed with colonoscopy, if normal upper endoscopy at the same setting. She is going to get a set of labs to check for celiac sprue. She is currently off of Zaroxolyn she will stay off until the  procedure next week.     

## 2012-07-27 NOTE — Patient Instructions (Addendum)
You will be set up for a colonoscopy. You will be set up for an upper endoscopy for iron def anemia ([facility] moderate sedation, next week?) You will have labs checked today in the basement lab.  Please head down after you check out with the front desk  (celiac sprue panel). A copy of this information will be made available to Dr. Wylene Simmer.  You have been scheduled for a colonoscopy. Please follow written instructions given to you at your visit today.  Please pick up your prep kit at the pharmacy within the next 1-3 days. If you use inhalers (even only as needed) or a CPAP machine, please bring them with you on the day of your procedure.

## 2012-07-30 LAB — CELIAC PANEL 10
IgA: 204 mg/dL (ref 69–380)
Tissue Transglutaminase Ab, IgA: 7.2 U/mL (ref <20)

## 2012-08-01 ENCOUNTER — Encounter (HOSPITAL_COMMUNITY)
Admission: RE | Disposition: A | Discharge status: Home / Self Care | Payer: Self-pay | Source: Ambulatory Visit | Attending: Gastroenterology

## 2012-08-01 ENCOUNTER — Other Ambulatory Visit: Payer: Self-pay

## 2012-08-01 ENCOUNTER — Encounter (HOSPITAL_COMMUNITY): Payer: Self-pay

## 2012-08-01 ENCOUNTER — Ambulatory Visit (HOSPITAL_COMMUNITY)
Admission: RE | Admit: 2012-08-01 | Discharge: 2012-08-01 | Disposition: A | Discharge status: Home / Self Care | Payer: Medicare Other | Source: Ambulatory Visit | Attending: Gastroenterology | Admitting: Gastroenterology

## 2012-08-01 DIAGNOSIS — E785 Hyperlipidemia, unspecified: Secondary | ICD-10-CM | POA: Insufficient documentation

## 2012-08-01 DIAGNOSIS — K5730 Diverticulosis of large intestine without perforation or abscess without bleeding: Secondary | ICD-10-CM

## 2012-08-01 DIAGNOSIS — D509 Iron deficiency anemia, unspecified: Secondary | ICD-10-CM | POA: Insufficient documentation

## 2012-08-01 DIAGNOSIS — K31819 Angiodysplasia of stomach and duodenum without bleeding: Secondary | ICD-10-CM | POA: Insufficient documentation

## 2012-08-01 DIAGNOSIS — Z79899 Other long term (current) drug therapy: Secondary | ICD-10-CM | POA: Insufficient documentation

## 2012-08-01 DIAGNOSIS — D649 Anemia, unspecified: Secondary | ICD-10-CM

## 2012-08-01 DIAGNOSIS — D126 Benign neoplasm of colon, unspecified: Secondary | ICD-10-CM

## 2012-08-01 DIAGNOSIS — Z950 Presence of cardiac pacemaker: Secondary | ICD-10-CM | POA: Insufficient documentation

## 2012-08-01 HISTORY — PX: COLONOSCOPY: SHX5424

## 2012-08-01 HISTORY — PX: ESOPHAGOGASTRODUODENOSCOPY: SHX5428

## 2012-08-01 SURGERY — EGD (ESOPHAGOGASTRODUODENOSCOPY)
Anesthesia: Moderate Sedation

## 2012-08-01 MED ORDER — DIPHENHYDRAMINE HCL 50 MG/ML IJ SOLN
INTRAMUSCULAR | Status: AC
  Filled 2012-08-01: qty 1

## 2012-08-01 MED ORDER — MIDAZOLAM HCL 10 MG/2ML IJ SOLN
INTRAMUSCULAR | Status: AC
  Filled 2012-08-01: qty 4

## 2012-08-01 MED ORDER — BUTAMBEN-TETRACAINE-BENZOCAINE 2-2-14 % EX AERO
INHALATION_SPRAY | CUTANEOUS | Status: DC | PRN
  Administered 2012-08-01: 2 via TOPICAL

## 2012-08-01 MED ORDER — SODIUM CHLORIDE 0.9 % IV SOLN: INTRAVENOUS | Status: DC

## 2012-08-01 MED ORDER — FENTANYL CITRATE 0.05 MG/ML IJ SOLN
INTRAMUSCULAR | Status: DC | PRN
  Administered 2012-08-01 (×4): 25 ug via INTRAVENOUS

## 2012-08-01 MED ORDER — MIDAZOLAM HCL 10 MG/2ML IJ SOLN
INTRAMUSCULAR | Status: DC | PRN
  Administered 2012-08-01 (×5): 2 mg via INTRAVENOUS

## 2012-08-01 MED ORDER — FENTANYL CITRATE 0.05 MG/ML IJ SOLN
INTRAMUSCULAR | Status: AC
  Filled 2012-08-01: qty 4

## 2012-08-01 NOTE — H&P (View-Only) (Signed)
HPI: This is a     very pleasant 78 year old woman whom I am meeting for the first time today   She has not seen any visible.  No gi or other. No black colored stools.   Was put on iron last week  When Hb came back 7.3, mcv 74.  Received 2 units blood last, and hemoglobin bump to 9.8,   She was Hemoccult-negative last week   Had colonoscopy between 5-10 years ago in [place] it was normal.  No polyps, no cancer.  Never had EGD.  No nsaids.  Only tylenol.    Has gained 10 pounds in 2013.  Was on xarelto, stopped 1 week ago due to anemia.   Review of systems: Pertinent positive and negative review of systems were noted in the above HPI section. Complete review of systems was performed and was otherwise normal.    Past Medical History  Diagnosis Date  . PAF (paroxysmal atrial fibrillation)     previously on propafenone  . Carpal tunnel syndrome of left wrist 12/20/2010    left hand  . Influenza-like illness 2009    H1N1 PCR negative  . Urinary tract infection   . Hyperlipidemia     on Mevacor  . Osteopenia   . TIA (transient ischemic attack) Dec 2012  . Tuberculosis      hx of over 50 years ago "  . Sick sinus syndrome   . Pacemaker - [facility]     Implant Jan 2013  . Anemia     Past Surgical History  Procedure Date  . Back surgery 1976  . Other surgical history In the late 1960's    Hysterectomy secondary to fibroids  . Other surgical history     cataract surgery  . Abdominal hysterectomy   . Insert / replace / remove pacemaker 10/21/2011    Current Outpatient Prescriptions  Medication Sig Dispense Refill  . Multiple Vitamins-Minerals (WOMENS MULTIVITAMIN PLUS PO) Take 1 tablet by mouth daily.      Marland Kitchen lovastatin (MEVACOR) 20 MG tablet Take 20 mg by mouth at bedtime.      Marland Kitchen oxybutynin (DITROPAN) 5 MG tablet Take 5 mg by mouth daily.       . temazepam (RESTORIL) 15 MG capsule Take 15 mg by mouth as needed.        Allergies as of 07/27/2012 - Review Complete  07/27/2012  Allergen Reaction Noted  . Lisinopril Other (See Comments) 10/14/2011    Family History  Problem Relation Age of Onset  . Dementia Mother   . Emphysema Father   . Heart disease Father   . Lymphoma Daughter     in bone    History   Social History  . Marital Status: Married    Spouse Name: N/A    Number of Children: 2  . Years of Education: N/A   Occupational History  . retired2    Social History Main Topics  . Smoking status: Former Smoker    Types: Cigarettes    Quit date: 10/05/1988  . Smokeless tobacco: Never Used  . Alcohol Use: No  . Drug Use: No  . Sexually Active: No   Other Topics Concern  . Not on file   Social History Narrative  . No narrative on file       Physical Exam: BP 132/84  Pulse 84  Ht 5' 3.75" (1.619 m)  Wt 173 lb 6 oz (78.642 kg)  BMI 29.99 kg/m2 Constitutional: generally well-appearing Psychiatric: alert  and oriented x3 Eyes: extraocular movements intact Mouth: oral pharynx moist, no lesions Neck: supple no lymphadenopathy Cardiovascular: heart regular rate and rhythm Lungs: clear to auscultation bilaterally Abdomen: soft, nontender, nondistended, no obvious ascites, no peritoneal signs, normal bowel sounds Extremities: no lower extremity edema bilaterally Skin: no lesions on visible extremities    Assessment and plan: 78 y.o. female with  iron deficiency anemia rather significant, Hemoccult negative  it is not clear if she is bleeding from a GI source. She is however significantly iron deficient and anemic to the level of 7.3 last week. She has been transfused and we'll proceed with colonoscopy, if normal upper endoscopy at the same setting. She is going to get a set of labs to check for celiac sprue. She is currently off of Zaroxolyn she will stay off until the  procedure next week.

## 2012-08-01 NOTE — Op Note (Signed)
Colonoscopy and EGD performed. There were technical difficulties between Pentax and EPIC and so this will serve as the procedure reports  Colonoscopy to cecum meds fentanyl 100mg  IV, versed 10mg  IV Indications Isra, hemocult negative Findings: single small polyp in transverse colon, this was 4mm across, sessile, removed with cold snare and sent to pathology. Left sided diverticulosis. The examination was otherwise normal.  EGD Cetacaine spray, residual sedation from colonoscopy Indications (same) Findings: no blood in UGI tract. 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 Given heme negative stool, these were not treated with APC. Normal duodenum was biopsied to check for Sprue and sent to pathology.

## 2012-08-01 NOTE — Interval H&P Note (Signed)
History and Physical Interval Note:  08/01/2012 11:32 AM  Mikayla Harper  has presented today for surgery, with the diagnosis of Iron deficiency anemia [280.9]  The various methods of treatment have been discussed with the patient and family. After consideration of risks, benefits and other options for treatment, the patient has consented to  Procedure(s) (LRB) with comments: ESOPHAGOGASTRODUODENOSCOPY (EGD) (N/A) COLONOSCOPY (N/A) as a surgical intervention .  The patient's history has been reviewed, patient examined, no change in status, stable for surgery.  I have reviewed the patient's chart and labs.  Questions were answered to the patient's satisfaction.     Rob Bunting

## 2012-08-02 ENCOUNTER — Encounter (HOSPITAL_COMMUNITY): Payer: Self-pay

## 2012-08-02 ENCOUNTER — Encounter (HOSPITAL_COMMUNITY): Payer: Self-pay | Admitting: Gastroenterology

## 2012-08-06 ENCOUNTER — Other Ambulatory Visit: Payer: Self-pay

## 2012-08-06 DIAGNOSIS — D649 Anemia, unspecified: Secondary | ICD-10-CM

## 2012-08-10 NOTE — Op Note (Signed)
[facility] [address] [place], [address]   ENDOSCOPY PROCEDURE REPORT  PATIENT: Mikayla Harper, Mikayla Harper  MR#: #[id number] BIRTHDATE: 04-08-1930 , 82  yrs. old GENDER: Female ENDOSCOPIST: Rachael Fee, MD PROCEDURE DATE:  08/01/2012 PROCEDURE:  EGD w/ biopsy ASA CLASS:     Class III INDICATIONS:  Mikayla Harper, fobt negative. MEDICATIONS: There was residual sedation effect present from prior procedure. TOPICAL ANESTHETIC: Cetacaine Spray  DESCRIPTION OF PROCEDURE: After the risks benefits and alternatives of the procedure were thoroughly explained, informed consent was obtained.  The Pentax [id number] [id number] endoscope was introduced through the mouth and advanced to the second portion of the duodenum. Without limitations.  The instrument was slowly withdrawn as the mucosa was fully examined.     There was one small AVM in distal stomach, without bleeding.  There were two small AVMs in duodenal sweep, non-bleeding.  These were not treated given FOBT negative status and no overt bleeding.  The examination was otherwise normal.  Biopsies taken from duodenum and sent to pathology.  Retroflexed views revealed no abnormalities. The scope was then withdrawn from the patient and the procedure completed.  COMPLICATIONS: There were no complications.  ENDOSCOPIC IMPRESSION: There was one small AVM in distal stomach, without bleeding.  There were two small AVMs in duodenal sweep, non-bleeding.  These were not treated given FOBT negative status and no overt bleeding.  The examination was otherwise normal.  Biopsies taken from duodenum and sent to pathology.  RECOMMENDATIONS: Await final pathology. Please start once dialy OTC iron supplement and it is OK to restart your blood thinner. Dr.  Christella Hartigan' office will contact you for return visit and labs the day prior.    eSigned:  Rachael Fee, MD 08/01/2012 1:23 PM   XB:[id number] Tisovec, MD

## 2012-08-27 ENCOUNTER — Other Ambulatory Visit (INDEPENDENT_AMBULATORY_CARE_PROVIDER_SITE_OTHER): Payer: Medicare Other

## 2012-08-27 DIAGNOSIS — D649 Anemia, unspecified: Secondary | ICD-10-CM

## 2012-08-27 LAB — BASIC METABOLIC PANEL
BUN: 18 mg/dL (ref 6–23)
CO2: 26 mEq/L (ref 19–32)
Calcium: 9.6 mg/dL (ref 8.4–10.5)
Creatinine, Ser: 1 mg/dL (ref 0.4–1.2)
Glucose, Bld: 97 mg/dL (ref 70–99)

## 2012-08-27 LAB — CBC WITH DIFFERENTIAL/PLATELET
Basophils Relative: 0.9 % (ref 0.0–3.0)
Eosinophils Relative: 5.1 % — ABNORMAL HIGH (ref 0.0–5.0)
HCT: 33.4 % — ABNORMAL LOW (ref 36.0–46.0)
Hemoglobin: 10.6 g/dL — ABNORMAL LOW (ref 12.0–15.0)
Lymphs Abs: 1.3 10*3/uL (ref 0.7–4.0)
Monocytes Relative: 12.6 % — ABNORMAL HIGH (ref 3.0–12.0)
Neutro Abs: 2.3 10*3/uL (ref 1.4–7.7)
RDW: 27.4 % — ABNORMAL HIGH (ref 11.5–14.6)
WBC: 4.4 10*3/uL — ABNORMAL LOW (ref 4.5–10.5)

## 2012-08-28 ENCOUNTER — Encounter: Payer: Self-pay | Admitting: Gastroenterology

## 2012-08-29 ENCOUNTER — Encounter: Payer: Self-pay | Admitting: Gastroenterology

## 2012-08-29 ENCOUNTER — Ambulatory Visit (INDEPENDENT_AMBULATORY_CARE_PROVIDER_SITE_OTHER): Payer: Medicare Other | Admitting: Gastroenterology

## 2012-08-29 VITALS — BP 120/70 | HR 64 | Ht 63.75 in | Wt 174.0 lb

## 2012-08-29 DIAGNOSIS — D649 Anemia, unspecified: Secondary | ICD-10-CM

## 2012-08-29 NOTE — Patient Instructions (Addendum)
Continue once daily iron, try taking it at bedtime.  If your are still having nausea, call and then we will put you on prescription iron supplement. Labs in 2 months, (CBC, iron, ferritin, tibc level)

## 2012-08-29 NOTE — Progress Notes (Signed)
Review of pertinent gastrointestinal problems: 1. 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 EGD November 2013 found small AVMs stomach, duodenum. They were nonbleeding. She was started on iron supplements with good response of hemoglobin, MCV 74 up to 85 after one month.  has had no overt GI bleeding 2. colonoscopy November 2013 found left-sided diverticulosis, one small polyp (age 78), no recall.   HPI: This is a  very pleasant 78 year old woman whom I last saw at the time of upper and lower endoscopies one month ago. See those results summarized above.  CBC yesterday shows continued improvement in her blood counts, MCV increasing.  She has really been fine since I last saw her.  No overt bleeding.  Stools are darker since starting iron.   Past Medical History  Diagnosis Date  . PAF (paroxysmal atrial fibrillation)     previously on propafenone  . Carpal tunnel syndrome of left wrist 12/20/2010    left hand  . Influenza-like illness 2009    H1N1 PCR negative  . Urinary tract infection   . Hyperlipidemia     on Mevacor  . Osteopenia   . TIA (transient ischemic attack) Dec 2012  . Tuberculosis      hx of over 50 years ago "  . Sick sinus syndrome   . Pacemaker - [facility]     Implant Jan 2013  . Anemia     Past Surgical History  Procedure Date  . Back surgery 1976  . Other surgical history In the late 1960's    Hysterectomy secondary to fibroids  . Other surgical history     cataract surgery  . Abdominal hysterectomy   . Insert / replace / remove pacemaker 10/21/2011  . Esophagogastroduodenoscopy 08/01/2012    Procedure: ESOPHAGOGASTRODUODENOSCOPY (EGD);  Surgeon: Rachael Fee, MD;  Location: [facility];  Service: Endoscopy;  Laterality: N/A;  . Colonoscopy 08/01/2012    Procedure: COLONOSCOPY;  Surgeon: Rachael Fee, MD;  Location: [facility];  Service: Endoscopy;  Laterality: N/A;    Current Outpatient Prescriptions  Medication Sig Dispense Refill  . lovastatin  (MEVACOR) 20 MG tablet Take 20 mg by mouth at bedtime.      . Multiple Vitamins-Minerals (WOMENS MULTIVITAMIN PLUS PO) Take 1 tablet by mouth daily.      Marland Kitchen oxybutynin (DITROPAN) 5 MG tablet Take 5 mg by mouth daily.       . temazepam (RESTORIL) 15 MG capsule Take 15 mg by mouth as needed.        Allergies as of 08/29/2012 - Review Complete 08/29/2012  Allergen Reaction Noted  . Lisinopril Other (See Comments) 10/14/2011    Family History  Problem Relation Age of Onset  . Dementia Mother   . Emphysema Father   . Heart disease Father   . Lymphoma Daughter     in bone    History   Social History  . Marital Status: Married    Spouse Name: N/A    Number of Children: 2  . Years of Education: N/A   Occupational History  . retired2    Social History Main Topics  . Smoking status: Former Smoker    Types: Cigarettes    Quit date: 10/05/1988  . Smokeless tobacco: Never Used  . Alcohol Use: No  . Drug Use: No  . Sexually Active: No   Other Topics Concern  . Not on file   Social History Narrative  . No narrative on file      Physical Exam:  BP 120/70  Pulse 64  Ht 5' 3.75" (1.619 m)  Wt 174 lb (78.926 kg)  BMI 30.10 kg/m2 Constitutional: generally well-appearing Psychiatric: alert and oriented x3 Abdomen: soft, nontender, nondistended, no obvious ascites, no peritoneal signs, normal bowel sounds     Assessment and plan: 78 y.o. female with iron deficiency anemia, possibly from upper GI tract AVMs  Her iron deficiency anemia is responding well to once daily iron supplement. She has a bit of nausea with the pill and so she is going to try taking it at bedtime. Is not helpful for her nausea that I'll be happy to call in a prescription formulation. She will also get a repeat set of labs in 2 months. We will do that here.

## 2012-10-16 ENCOUNTER — Telehealth: Payer: Self-pay | Admitting: Internal Medicine

## 2012-10-16 NOTE — Telephone Encounter (Signed)
New problem:     3 weeks ago went to PCP was told she had fluids on lungs.  C/o sob.

## 2012-10-16 NOTE — Telephone Encounter (Signed)
Spoke with pt. She reports she saw primary MD 3 weeks ago for shortness of breath and cough. Chest X-ray was done and she was told she had fluid on lungs. Lasix and antibiotic started. She has noticed little improvement. Is taking Lasix. Has had 12 lb weight gain since September. She weighs self daily and no recent change.  Wt 168 lbs today and yesterday .No swelling.  She called primary MD and states she was told to contact our office. Appt made for pt to see Dr. Swaziland on October 17, 2012 at 4:15

## 2012-10-16 NOTE — Telephone Encounter (Signed)
Dr. Swaziland is the patient's primary cardiologist.

## 2012-10-17 ENCOUNTER — Encounter: Payer: Self-pay | Admitting: Cardiology

## 2012-10-17 ENCOUNTER — Encounter: Payer: Self-pay | Admitting: Pulmonary Disease

## 2012-10-17 ENCOUNTER — Ambulatory Visit (INDEPENDENT_AMBULATORY_CARE_PROVIDER_SITE_OTHER): Payer: Medicare Other | Admitting: Cardiology

## 2012-10-17 ENCOUNTER — Ambulatory Visit (INDEPENDENT_AMBULATORY_CARE_PROVIDER_SITE_OTHER): Payer: Medicare Other | Admitting: Pulmonary Disease

## 2012-10-17 VITALS — BP 144/88 | HR 106 | Ht 63.0 in | Wt 171.8 lb

## 2012-10-17 VITALS — BP 124/72 | HR 96 | Temp 97.4°F | Ht 63.0 in | Wt 171.4 lb

## 2012-10-17 DIAGNOSIS — I495 Sick sinus syndrome: Secondary | ICD-10-CM

## 2012-10-17 DIAGNOSIS — I4891 Unspecified atrial fibrillation: Secondary | ICD-10-CM

## 2012-10-17 DIAGNOSIS — E785 Hyperlipidemia, unspecified: Secondary | ICD-10-CM

## 2012-10-17 DIAGNOSIS — I10 Essential (primary) hypertension: Secondary | ICD-10-CM

## 2012-10-17 DIAGNOSIS — J449 Chronic obstructive pulmonary disease, unspecified: Secondary | ICD-10-CM

## 2012-10-17 MED ORDER — PREDNISONE 10 MG PO TABS: ORAL_TABLET | ORAL | Status: DC

## 2012-10-17 NOTE — Patient Instructions (Addendum)
Your breathing tests today show that you have moderate emphysema. Will treat you with a course of prednisone to get you thru this episode Start spiriva one inhalation each am.  followup with me in 4 weeks.

## 2012-10-17 NOTE — Assessment & Plan Note (Signed)
The patient has moderate airflow obstruction by her spirometry today, and it is unclear how much of this is fixed and how much may be related to her current flareup and symptoms.  I suspect she is having ongoing airway inflammation leading to cough and shortness of breath.  I would like to treat her with a course of prednisone, and also start her on a maintenance LAMA.  She is to followup with me in one month.

## 2012-10-17 NOTE — Addendum Note (Signed)
Addended by: Caryl Ada on: 10/17/2012 09:52 AM   Modules accepted: Orders

## 2012-10-17 NOTE — Patient Instructions (Signed)
Continue your current therapy  You can stop lasix now.  I will see you in 6 months.

## 2012-10-17 NOTE — Progress Notes (Signed)
[name]. Poke Date of Birth: [birth year] Medical Record #[id number]  History of Present Illness: Mikayla Harper is seen today for a followup visit. Since her last visit here in August she required a blood transfusion when her hemoglobin dropped to 7.3. She reports that she had panendoscopy in that there were no significant findings. She is on iron therapy. Her last hemoglobin had improved to 10.6. Recently she complained of increase in symptoms of cough and shortness of breath. She had a chest x-ray done by her primary care was placed on antibiotics and Lasix 20 mg a day. She really did not feel any benefit from this therapy. She saw pulmonary this morning. Spirometry revealed moderate airway obstruction and she was given a course of steroids and started on Spiriva.  Current Outpatient Prescriptions on File Prior to Visit  Medication Sig Dispense Refill  . ferrous sulfate 325 (65 FE) MG tablet Take 325 mg by mouth daily with breakfast.      . furosemide (LASIX) 20 MG tablet Take 1 tablet by mouth daily.      Marland Kitchen lovastatin (MEVACOR) 20 MG tablet Take 20 mg by mouth at bedtime.      . Multiple Vitamins-Minerals (WOMENS MULTIVITAMIN PLUS PO) Take 1 tablet by mouth daily.      Marland Kitchen oxybutynin (DITROPAN) 5 MG tablet Take 5 mg by mouth daily.       . temazepam (RESTORIL) 15 MG capsule Take 15 mg by mouth as needed.        Allergies  Allergen Reactions  . Lisinopril Other (See Comments)    Dizzy and lightheadedness    Past Medical History  Diagnosis Date  . PAF (paroxysmal atrial fibrillation)     previously on propafenone  . Carpal tunnel syndrome of left wrist 12/20/2010    left hand  . Influenza-like illness 2009    H1N1 PCR negative  . Urinary tract infection   . Hyperlipidemia     on Mevacor  . Osteopenia   . TIA (transient ischemic attack) Dec 2012  . Tuberculosis      hx of over 50 years ago "  . Sick sinus syndrome   . Pacemaker - [facility]     Implant Jan 2013  . Anemia      Past Surgical History  Procedure Date  . Back surgery 1976  . Other surgical history In the late 1960's    Hysterectomy secondary to fibroids  . Other surgical history     cataract surgery  . Abdominal hysterectomy   . Insert / replace / remove pacemaker 10/21/2011  . Esophagogastroduodenoscopy 08/01/2012    Procedure: ESOPHAGOGASTRODUODENOSCOPY (EGD);  Surgeon: Rachael Fee, MD;  Location: [facility];  Service: Endoscopy;  Laterality: N/A;  . Colonoscopy 08/01/2012    Procedure: COLONOSCOPY;  Surgeon: Rachael Fee, MD;  Location: [facility];  Service: Endoscopy;  Laterality: N/A;    History  Smoking status  . Former Smoker -- 1.0 packs/day for 30 years  . Types: Cigarettes  . Quit date: 10/05/1988  Smokeless tobacco  . Never Used    History  Alcohol Use No    Family History  Problem Relation Age of Onset  . Dementia Mother   . Emphysema Father   . Heart disease Father   . Lymphoma Daughter     in bone    Review of Systems: The review of systems is per the HPI.  All other systems were reviewed and are negative.  Physical Exam: BP  144/88  Pulse 106  Ht 5\' 3"  (1.6 m)  Wt 171 lb 12.8 oz (77.928 kg)  BMI 30.43 kg/m2 Patient is very pleasant and in no acute distress. Skin is warm and dry. Color is normal.  HEENT is unremarkable. Normocephalic/atraumatic. PERRL. Sclera are nonicteric. Neck is supple. No masses. No JVD. Lungs are clear. She does have some palpable left shoulder pain. Cardiac exam shows a regular rate and rhythm. Pacemaker in the left upper chest looks good. Left arm looks ok. Abdomen is soft. Extremities are without edema. Gait and ROM are intact. No gross neurologic deficits noted.   LABORATORY DATA:  ECG today demonstrates atrial fibrillation with a ventricular response 106 beats per minute. It is otherwise normal. Assessment / Plan: 1. Paroxysmal atrial fibrillation. Rate may be mildly elevated related to her breathing difficulties. She  is on chronic anticoagulation was Xarelto. She is status post DDD pacemaker implant for sick sinus syndrome. She is scheduled for followup in the pacemaker clinic this week.  2. Dyspnea. I agree that this is most likely airway inflammation with obstruction. I recommended stopping her Lasix. She had a normal echocardiogram last year. I think it is unlikely that this represents congestive heart failure. She has no evidence of volume overload.

## 2012-10-17 NOTE — Progress Notes (Signed)
  Subjective:    Patient ID: Mikayla Harper, female    DOB: 22-Oct-1929, 79 y.o.   MRN: [id number]  HPI The patient comes in today for an acute sick visit.  She has not been seen in 2 years, with the last being a visit for chronic cough.  This is felt to be upper airway in origin, and the patient never returned because her cough resolved and she was doing well.  She comes in today with a three-week history of increased cough, very little mucus production, but persistent chest congestion.  She is also more short of breath.  She had a chest x-ray that showed congestive heart failure, and she tells me that she is being treated with extra diuretic.  She is also being treated with an antibiotic, but tells me that she was not diagnosed with pneumonia.  Despite this treatment, she continues to have some shortness of breath as well as a significant cough.   Review of Systems  Constitutional: Negative for fever and unexpected weight change.  HENT: Positive for rhinorrhea. Negative for ear pain, nosebleeds, congestion, sore throat, sneezing, trouble swallowing, dental problem, postnasal drip and sinus pressure.   Eyes: Negative for redness and itching.  Respiratory: Positive for cough ( prod cough---white mucus), shortness of breath and wheezing. Negative for chest tightness.   Cardiovascular: Negative for chest pain, palpitations ( patient has pace maker) and leg swelling.  Gastrointestinal: Negative for nausea and vomiting.  Genitourinary: Negative for dysuria.  Musculoskeletal: Negative for joint swelling.  Skin: Negative for rash.  Neurological: Negative for headaches.  Hematological: Does not bruise/bleed easily.  Psychiatric/Behavioral: Negative for dysphoric mood. The patient is not nervous/anxious.        Objective:   Physical Exam Well-developed female in no acute distress Nose without purulence or discharge noted Neck without lymphadenopathy or thyromegaly Chest with good air flow, no  definite wheezing, prominent upper airway noise and pseudo-wheezing. Cardiac exam with slightly irregular rhythm Lower extremities without significant edema, no cyanosis Alert and oriented, moves all 4 extremities.        Assessment & Plan:

## 2012-10-22 ENCOUNTER — Encounter: Payer: Self-pay | Admitting: Internal Medicine

## 2012-10-22 ENCOUNTER — Ambulatory Visit (INDEPENDENT_AMBULATORY_CARE_PROVIDER_SITE_OTHER): Payer: Medicare Other | Admitting: Internal Medicine

## 2012-10-22 VITALS — BP 146/97 | HR 92 | Ht 63.0 in | Wt 171.0 lb

## 2012-10-22 DIAGNOSIS — Z950 Presence of cardiac pacemaker: Secondary | ICD-10-CM

## 2012-10-22 DIAGNOSIS — I4891 Unspecified atrial fibrillation: Secondary | ICD-10-CM

## 2012-10-22 DIAGNOSIS — R0989 Other specified symptoms and signs involving the circulatory and respiratory systems: Secondary | ICD-10-CM

## 2012-10-22 DIAGNOSIS — I10 Essential (primary) hypertension: Secondary | ICD-10-CM

## 2012-10-22 DIAGNOSIS — R0609 Other forms of dyspnea: Secondary | ICD-10-CM

## 2012-10-22 DIAGNOSIS — I495 Sick sinus syndrome: Secondary | ICD-10-CM

## 2012-10-22 LAB — PACEMAKER DEVICE OBSERVATION
AL AMPLITUDE: 5 mv
AL IMPEDENCE PM: 390 Ohm
ATRIAL PACING PM: 15
BAMS-0003: 70 {beats}/min
BRDY-0004RV: 120 {beats}/min
DEVICE MODEL PM: 7300824
RV LEAD AMPLITUDE: 7.5 mv
RV LEAD IMPEDENCE PM: 440 Ohm
RV LEAD THRESHOLD: 1 V

## 2012-10-22 NOTE — Assessment & Plan Note (Signed)
Stable post pacing with 20% atrial pacing

## 2012-10-22 NOTE — Progress Notes (Signed)
Patient Care Team: Gaspar Garbe, MD as PCP - General   HPI  Mikayla Harper is a 79 y.o. female  Seen in followup for a pacemaker implanted January 2013 for tachybradycardia syndrome with paroxysmal atrial fibrillation as well as prolonged pauses.  Since then she complains of insomnia. Rivaroxaban was started at that time. She was tried on a trial of dabigitran; she is now back on Rivaroxaban      She has had a history of atrial fib and has had past cardioversions x 3 while living in [place] She has been on chronic propafenone. She says she has not had recurrence since 2003.   She has had a lousy year. She's had a problem with a cough over the last couple of months she's had significant shortness of breath. She's had no symptomatic atrial fibrillation. She is seen pulmonary. She's been started on steroids. It has been Dr. Redgie Grayer impression that this is not a manifestation of heart failure and her diuretic was stopped. She has noted no significant worsening of her dyspnea since that was done.   She has had what was felt to be a recent TIA.  Echocardiogram 1/13 demonstrated normal left ventricular function and normal left atrial size and normal left atrial size there was diastolic dysfunction    Thromboembolic risk factors are notable for gender and age and prior TIA. She does has a CHADS-VASc score of 5;    Past Medical History  Diagnosis Date  . PAF (paroxysmal atrial fibrillation)     previously on propafenone  . Carpal tunnel syndrome of left wrist 12/20/2010    left hand  . Influenza-like illness 2009    H1N1 PCR negative  . Urinary tract infection   . Hyperlipidemia     on Mevacor  . Osteopenia   . TIA (transient ischemic attack) Dec 2012  . Tuberculosis      hx of over 50 years ago "  . Sick sinus syndrome   . Pacemaker - [facility]     Implant Jan 2013  . Anemia     Past Surgical History  Procedure Date  . Back surgery 1976  . Other surgical history In the late  1960's    Hysterectomy secondary to fibroids  . Other surgical history     cataract surgery  . Abdominal hysterectomy   . Insert / replace / remove pacemaker 10/21/2011  . Esophagogastroduodenoscopy 08/01/2012    Procedure: ESOPHAGOGASTRODUODENOSCOPY (EGD);  Surgeon: Rachael Fee, MD;  Location: [facility];  Service: Endoscopy;  Laterality: N/A;  . Colonoscopy 08/01/2012    Procedure: COLONOSCOPY;  Surgeon: Rachael Fee, MD;  Location: [facility];  Service: Endoscopy;  Laterality: N/A;    Current Outpatient Prescriptions  Medication Sig Dispense Refill  . ferrous sulfate 325 (65 FE) MG tablet Take 325 mg by mouth daily with breakfast.      . lovastatin (MEVACOR) 20 MG tablet Take 20 mg by mouth at bedtime.      Marland Kitchen MAGNESIUM CARBONATE PO Take by mouth daily.      . Multiple Vitamins-Minerals (WOMENS MULTIVITAMIN PLUS PO) Take 1 tablet by mouth daily.      Marland Kitchen oxybutynin (DITROPAN) 5 MG tablet Take 5 mg by mouth daily.       . potassium gluconate (SM POTASSIUM) 595 MG TABS Take 595 mg by mouth daily.      . temazepam (RESTORIL) 15 MG capsule Take 15 mg by mouth as needed.      Marland Kitchen  XARELTO 15 MG TABS tablet Take 1 tablet by mouth daily.      . predniSONE (DELTASONE) 10 MG tablet Take 4 tabs for 2 days, 3 tabs for 2 days, 2 tabs for 2 days, 1 tab for 2 days then stop  20 tablet  0    Allergies  Allergen Reactions  . Lisinopril Other (See Comments)    Dizzy and lightheadedness    Review of Systems negative except from HPI and PMH  Physical Exam BP 146/97  Pulse 92  Ht 5\' 3"  (1.6 m)  Wt 171 lb (77.565 kg)  BMI 30.29 kg/m2  SpO2 99%  Well developed and well nourished in no acute distress HENT normal E scleral and icterus clear Neck Supple JVP6-7   Clear to ausculation** Regular rate and rhythm, no murmurs gallops or rub Soft with active bowel sounds No clubbing cyanosis none Edema Alert and oriented, grossly normal motor and sensory function Skin Warm and Dry  ECG  demonstrates atrial fibrillation at 92 Exline intervals-/07/34 Occasional PVC  Assessment and  Plan

## 2012-10-22 NOTE — Assessment & Plan Note (Signed)
She's had recurrent atrial fibrillation. With her age and diastolic dysfunction I suspect that there is some contribution of HFpEF. We will resume her propafenone and see if she reverts to sinus rhythm as she has twice since she went into atrial fibrillation at the end of November. If not I would recommend that we cardioverter.

## 2012-10-22 NOTE — Assessment & Plan Note (Signed)
As noted above, I think there is probably an element of HFpEF. The patient will likely need to be back on her diuretics but will see how she does

## 2012-10-22 NOTE — Assessment & Plan Note (Signed)
Modestly elevated. Her heart rate is also modestly elevated. Hopefully the adjunct of a beta blocker in the form of propafenone will help both of these

## 2012-10-22 NOTE — Patient Instructions (Addendum)
Your physician wants you to follow-up in: 2 weeks with the device clinic  Your physician has recommended you make the following change in your medication: START either Rythmol SR 325 mg 1 tablet every 12 hours or Rythmol 225 mg 1 tablet every 8 hours

## 2012-10-30 ENCOUNTER — Other Ambulatory Visit (INDEPENDENT_AMBULATORY_CARE_PROVIDER_SITE_OTHER): Payer: Medicare Other

## 2012-10-30 DIAGNOSIS — D649 Anemia, unspecified: Secondary | ICD-10-CM

## 2012-10-30 LAB — CBC WITH DIFFERENTIAL/PLATELET
Basophils Absolute: 0 10*3/uL (ref 0.0–0.1)
HCT: 39.3 % (ref 36.0–46.0)
Lymphs Abs: 1.4 10*3/uL (ref 0.7–4.0)
Monocytes Absolute: 0.7 10*3/uL (ref 0.1–1.0)
Monocytes Relative: 10.9 % (ref 3.0–12.0)
Neutrophils Relative %: 62.6 % (ref 43.0–77.0)
Platelets: 236 10*3/uL (ref 150.0–400.0)
RDW: 13.4 % (ref 11.5–14.6)

## 2012-11-01 ENCOUNTER — Other Ambulatory Visit: Payer: Self-pay

## 2012-11-06 IMAGING — CR DG CHEST 2V
2 series · 2 of 2 positions shown · non-contrast
Comparison: 08/08/2008; chest CT - 07/30/2008

CLINICAL DATA: Pacemaker placement

CHEST - 2 VIEW

[w chest pa]
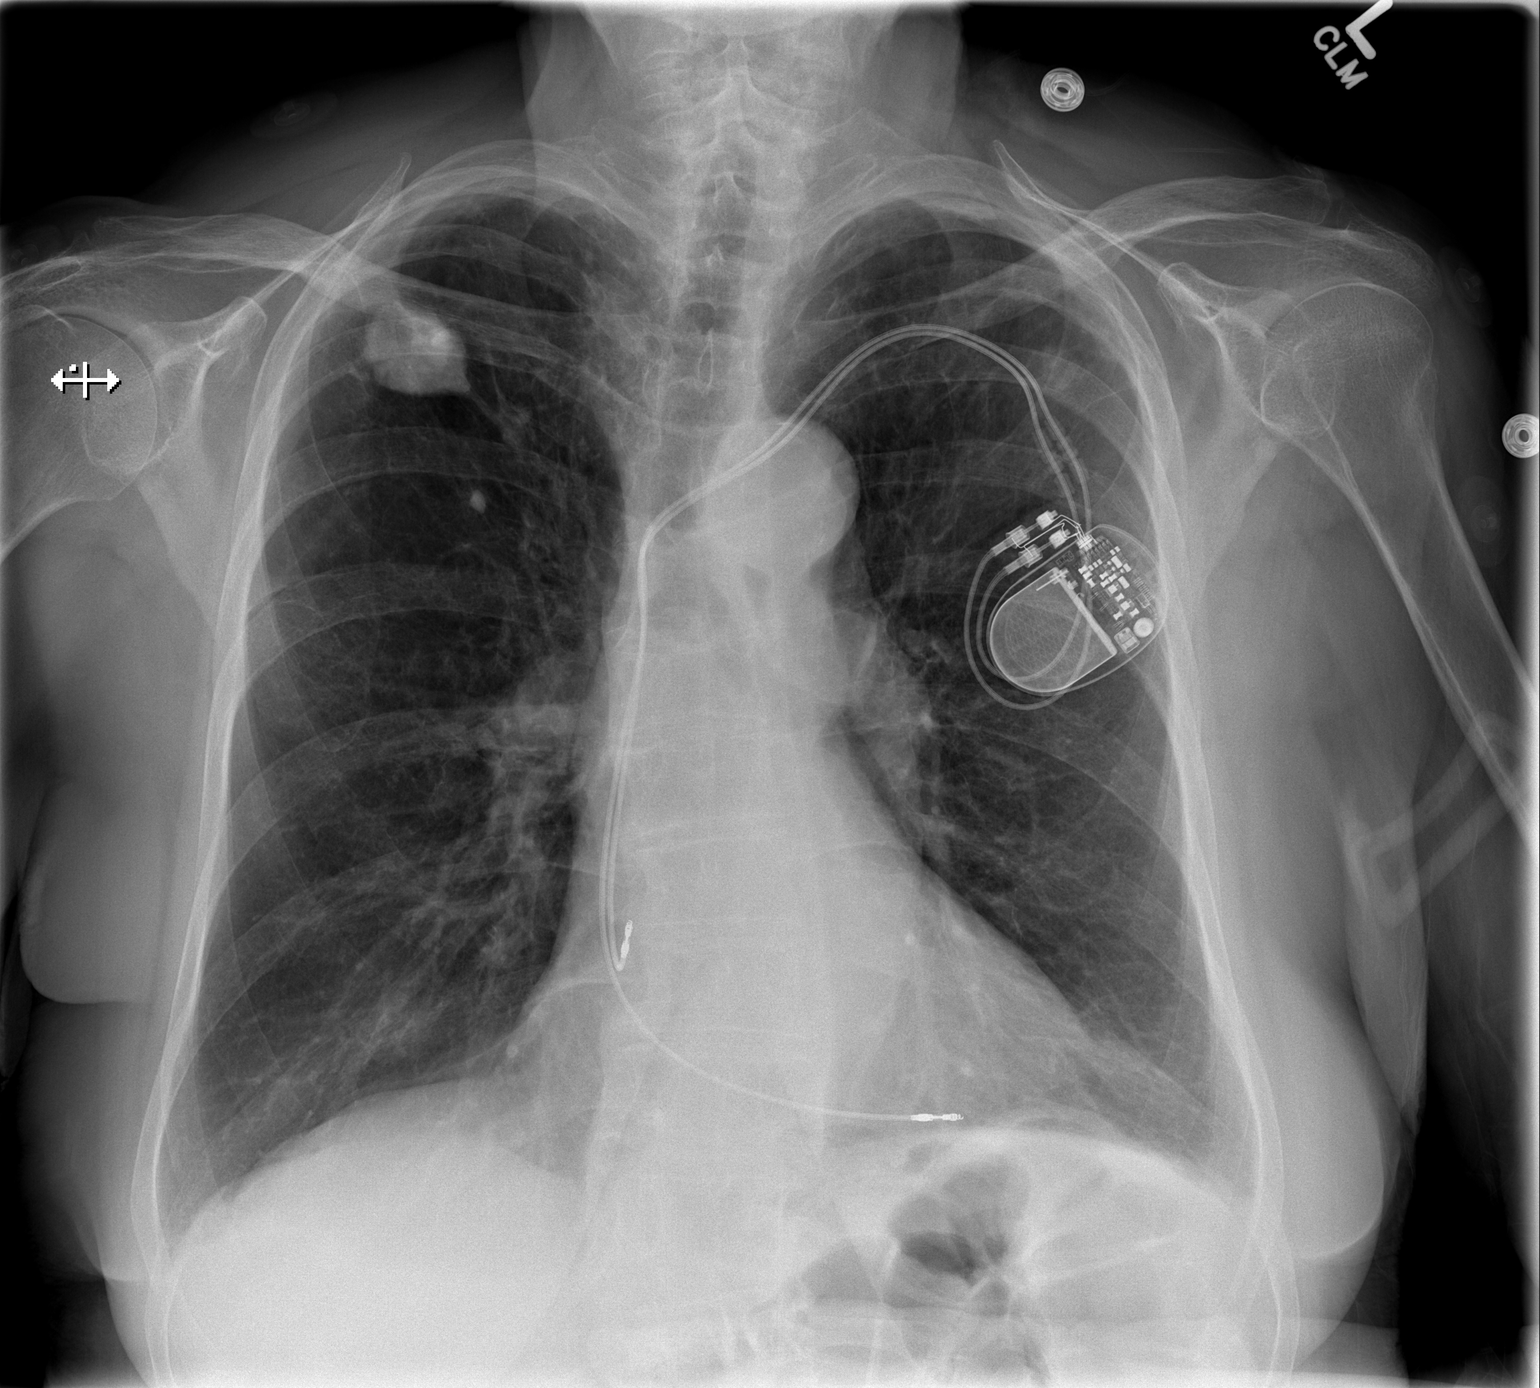

[w chest lat]
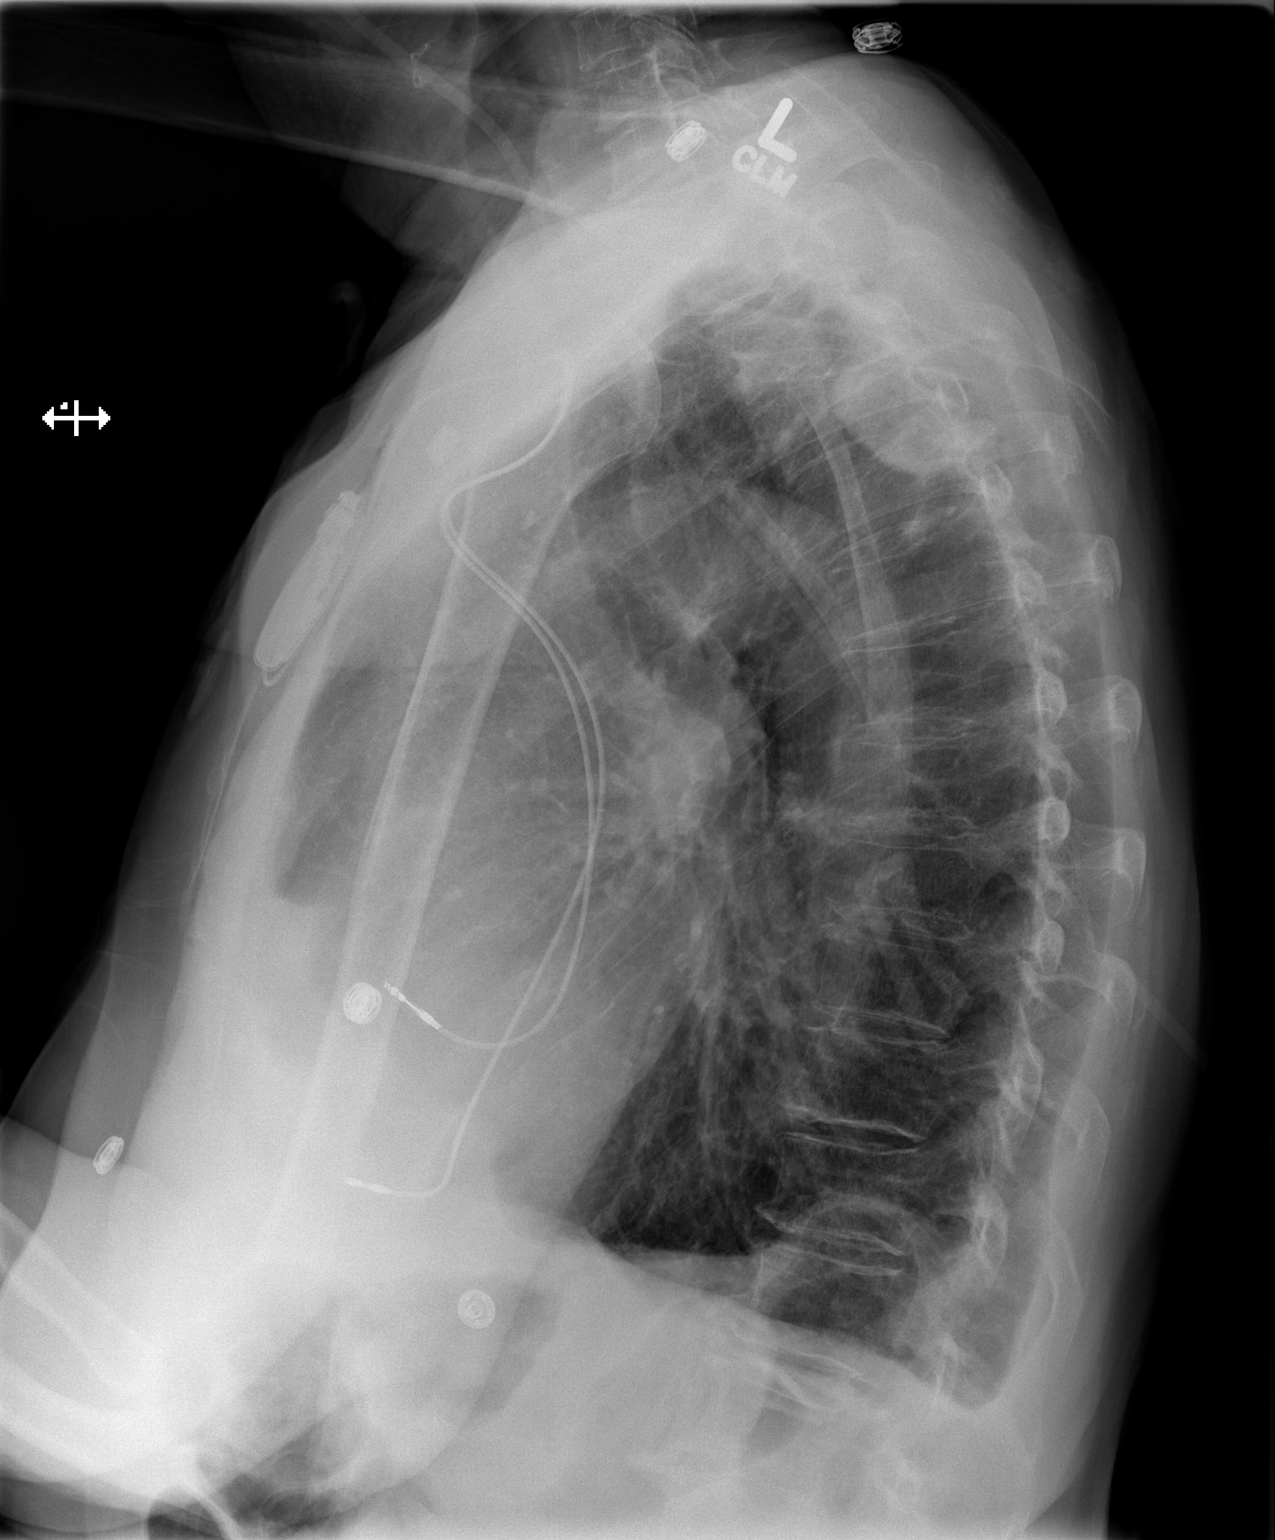

[2 of 2 positions shown; findings below may reference images not displayed]

FINDINGS: Unchanged cardiac silhouette and mediastinal contours.  Interval
placement of a left anterior chest wall dual lead pacemaker with
lead tips projected over the right atrium and ventricle. The lungs
remain hyperexpanded.  No new focal airspace opacities.  No pleural
effusion or pneumothorax.  Grossly unchanged bones.
IMPRESSION: 1.  Interval placement of left anterior chest wall dual lead
pacemaker without evidence of complication.
2.  Stable sequela of prior granulomatous infection.

## 2012-11-07 ENCOUNTER — Other Ambulatory Visit: Payer: Self-pay

## 2012-11-07 ENCOUNTER — Ambulatory Visit (INDEPENDENT_AMBULATORY_CARE_PROVIDER_SITE_OTHER): Payer: Medicare Other | Admitting: *Deleted

## 2012-11-07 ENCOUNTER — Encounter: Payer: Self-pay | Admitting: *Deleted

## 2012-11-07 DIAGNOSIS — I4891 Unspecified atrial fibrillation: Secondary | ICD-10-CM

## 2012-11-07 DIAGNOSIS — I495 Sick sinus syndrome: Secondary | ICD-10-CM

## 2012-11-07 LAB — PACEMAKER DEVICE OBSERVATION
ATRIAL PACING PM: 2.8
BAMS-0001: 150 {beats}/min
BAMS-0003: 70 {beats}/min
DEVICE MODEL PM: 7300824

## 2012-11-07 MED ORDER — DILTIAZEM HCL ER COATED BEADS 120 MG PO CP24: 120.0000 mg | ORAL_CAPSULE | Freq: Every day | ORAL | Status: DC

## 2012-11-07 NOTE — Progress Notes (Signed)
PPM check 

## 2012-11-07 NOTE — Addendum Note (Signed)
Addended by: Forestine Chute on: 11/07/2012 09:29 AM   Modules accepted: Orders

## 2012-11-13 ENCOUNTER — Encounter (HOSPITAL_COMMUNITY): Payer: Self-pay | Admitting: Certified Registered Nurse Anesthetist

## 2012-11-13 ENCOUNTER — Encounter (HOSPITAL_COMMUNITY)
Admission: RE | Disposition: A | Discharge status: Home / Self Care | Payer: Self-pay | Source: Ambulatory Visit | Attending: Cardiology

## 2012-11-13 ENCOUNTER — Ambulatory Visit (HOSPITAL_COMMUNITY): Payer: Medicare Other | Admitting: Certified Registered Nurse Anesthetist

## 2012-11-13 ENCOUNTER — Ambulatory Visit (HOSPITAL_COMMUNITY)
Admission: RE | Admit: 2012-11-13 | Discharge: 2012-11-13 | Disposition: A | Discharge status: Home / Self Care | Payer: Medicare Other | Source: Ambulatory Visit | Attending: Cardiology | Admitting: Cardiology

## 2012-11-13 DIAGNOSIS — I4891 Unspecified atrial fibrillation: Secondary | ICD-10-CM | POA: Insufficient documentation

## 2012-11-13 HISTORY — PX: CARDIOVERSION: SHX1299

## 2012-11-13 SURGERY — CARDIOVERSION
Anesthesia: Monitor Anesthesia Care

## 2012-11-13 MED ORDER — SODIUM CHLORIDE 0.9 % IV SOLN
INTRAVENOUS | Status: DC
  Administered 2012-11-13: 500 mL via INTRAVENOUS

## 2012-11-13 MED ORDER — PROPOFOL 10 MG/ML IV BOLUS
INTRAVENOUS | Status: DC | PRN
  Administered 2012-11-13: 100 mg via INTRAVENOUS

## 2012-11-13 MED ORDER — SODIUM CHLORIDE 0.9 % IV SOLN
INTRAVENOUS | Status: DC | PRN
  Administered 2012-11-13: 11:00:00 via INTRAVENOUS

## 2012-11-13 NOTE — Interval H&P Note (Signed)
History and Physical Interval Note:  11/13/2012 10:35 AM  Mikayla Harper  has presented today for surgery, with the diagnosis of a-fib  The various methods of treatment have been discussed with the patient and family. After consideration of risks, benefits and other options for treatment, the patient has consented to  Procedure(s): CARDIOVERSION (N/A) as a surgical intervention .  The patient's history has been reviewed, patient examined, no change in status, stable for surgery.  I have reviewed the patient's chart and labs.  Questions were answered to the patient's satisfaction.     Theron Arista [facility] 11/13/2012 10:36 AM

## 2012-11-13 NOTE — Transfer of Care (Signed)
Immediate Anesthesia Transfer of Care Note  Patient: Mikayla Harper  Procedure(s) Performed: Procedure(s): CARDIOVERSION (N/A)  Patient Location: PACU  Anesthesia Type:General  Level of Consciousness: sedated  Airway & Oxygen Therapy: Patient Spontanous Breathing and Patient connected to nasal cannula oxygen  Post-op Assessment: Report given to PACU RN and Post -op Vital signs reviewed and stable  Post vital signs: Reviewed and stable  Complications: No apparent anesthesia complications

## 2012-11-13 NOTE — CV Procedure (Signed)
Electrical Cardioversion Procedure Note Mikayla Harper [id number] 1930-02-15  Procedure: Electrical Cardioversion Indications:  Atrial Fibrillation  Procedure Details Consent: Risks of procedure as well as the alternatives and risks of each were explained to the (patient/caregiver).  Consent for procedure obtained. Time Out: Verified patient identification, verified procedure, site/side was marked, verified correct patient position, special equipment/implants available, medications/allergies/relevent history reviewed, required imaging and test results available.  Performed  Patient placed on cardiac monitor, pulse oximetry, supplemental oxygen as necessary.  Sedation given: Short-acting barbiturates Pacer pads placed anterior and posterior chest.  Cardioverted 2 time(s).  Cardioverted at 150J.  Evaluation Findings: Post procedure EKG shows: NSR Complications: None Patient did tolerate procedure well.   Mikayla Harper [place] MD,FACC  11/13/2012, 10:50 AM

## 2012-11-13 NOTE — Preoperative (Signed)
Beta Blockers   Reason not to administer Beta Blockers:Not Applicable 

## 2012-11-13 NOTE — Anesthesia Postprocedure Evaluation (Signed)
  Anesthesia Post-op Note  Patient: Mikayla Harper  Procedure(s) Performed: Procedure(s): CARDIOVERSION (N/A)  Patient Location: PACU  Anesthesia Type:General  Level of Consciousness: awake  Airway and Oxygen Therapy: Patient Spontanous Breathing and Patient connected to nasal cannula oxygen  Post-op Pain: none  Post-op Assessment: Post-op Vital signs reviewed, Patient's Cardiovascular Status Stable and Respiratory Function Stable  Post-op Vital Signs: Reviewed and stable  Complications: No apparent anesthesia complications

## 2012-11-13 NOTE — H&P (View-Only) (Signed)
Patient Care Team: Richard W Tisovec, MD as PCP - General   HPI  Mikayla Harper is a 79 y.o. female  Seen in followup for a pacemaker implanted January 2013 for tachybradycardia syndrome with paroxysmal atrial fibrillation as well as prolonged pauses.  Since then she complains of insomnia. Rivaroxaban was started at that time. She was tried on a trial of dabigitran; she is now back on Rivaroxaban      She has had a history of atrial fib and has had past cardioversions x 3 while living in [place] She has been on chronic propafenone. She says she has not had recurrence since 2003.   She has had a lousy year. She's had a problem with a cough over the last couple of months she's had significant shortness of breath. She's had no symptomatic atrial fibrillation. She is seen pulmonary. She's been started on steroids. It has been Dr. PJ's impression that this is not a manifestation of heart failure and her diuretic was stopped. She has noted no significant worsening of her dyspnea since that was done.   She has had what was felt to be a recent TIA.  Echocardiogram 1/13 demonstrated normal left ventricular function and normal left atrial size and normal left atrial size there was diastolic dysfunction    Thromboembolic risk factors are notable for gender and age and prior TIA. She does has a CHADS-VASc score of 5;    Past Medical History  Diagnosis Date  . PAF (paroxysmal atrial fibrillation)     previously on propafenone  . Carpal tunnel syndrome of left wrist 12/20/2010    left hand  . Influenza-like illness 2009    H1N1 PCR negative  . Urinary tract infection   . Hyperlipidemia     on Mevacor  . Osteopenia   . TIA (transient ischemic attack) Dec 2012  . Tuberculosis      hx of over 50 years ago "  . Sick sinus syndrome   . Pacemaker - [facility]     Implant Jan 2013  . Anemia     Past Surgical History  Procedure Date  . Back surgery 1976  . Other surgical history In the late  1960's    Hysterectomy secondary to fibroids  . Other surgical history     cataract surgery  . Abdominal hysterectomy   . Insert / replace / remove pacemaker 10/21/2011  . Esophagogastroduodenoscopy 08/01/2012    Procedure: ESOPHAGOGASTRODUODENOSCOPY (EGD);  Surgeon: Daniel P Jacobs, MD;  Location: [facility];  Service: Endoscopy;  Laterality: N/A;  . Colonoscopy 08/01/2012    Procedure: COLONOSCOPY;  Surgeon: Daniel P Jacobs, MD;  Location: [facility];  Service: Endoscopy;  Laterality: N/A;    Current Outpatient Prescriptions  Medication Sig Dispense Refill  . ferrous sulfate 325 (65 FE) MG tablet Take 325 mg by mouth daily with breakfast.      . lovastatin (MEVACOR) 20 MG tablet Take 20 mg by mouth at bedtime.      . MAGNESIUM CARBONATE PO Take by mouth daily.      . Multiple Vitamins-Minerals (WOMENS MULTIVITAMIN PLUS PO) Take 1 tablet by mouth daily.      . oxybutynin (DITROPAN) 5 MG tablet Take 5 mg by mouth daily.       . potassium gluconate (SM POTASSIUM) 595 MG TABS Take 595 mg by mouth daily.      . temazepam (RESTORIL) 15 MG capsule Take 15 mg by mouth as needed.      .   XARELTO 15 MG TABS tablet Take 1 tablet by mouth daily.      . predniSONE (DELTASONE) 10 MG tablet Take 4 tabs for 2 days, 3 tabs for 2 days, 2 tabs for 2 days, 1 tab for 2 days then stop  20 tablet  0    Allergies  Allergen Reactions  . Lisinopril Other (See Comments)    Dizzy and lightheadedness    Review of Systems negative except from HPI and PMH  Physical Exam BP 146/97  Pulse 92  Ht 5' 3" (1.6 m)  Wt 171 lb (77.565 kg)  BMI 30.29 kg/m2  SpO2 99%  Well developed and well nourished in no acute distress HENT normal E scleral and icterus clear Neck Supple JVP6-7   Clear to ausculation** Regular rate and rhythm, no murmurs gallops or rub Soft with active bowel sounds No clubbing cyanosis none Edema Alert and oriented, grossly normal motor and sensory function Skin Warm and Dry  ECG  demonstrates atrial fibrillation at 92 Exline intervals-/07/34 Occasional PVC  Assessment and  Plan  

## 2012-11-13 NOTE — Anesthesia Preprocedure Evaluation (Addendum)
Anesthesia Evaluation  Patient identified by MRN, date of birth, ID band Patient awake    Reviewed: Allergy & Precautions, H&P , NPO status , Patient's Chart, lab work & pertinent test results, reviewed documented beta blocker date and time   Airway Mallampati: II TM Distance: >3 FB Neck ROM: Full    Dental  (+) Edentulous Lower and Edentulous Upper   Pulmonary shortness of breath, COPD         Cardiovascular Exercise Tolerance: Poor hypertension, Pt. on home beta blockers + dysrhythmias Atrial Fibrillation + pacemaker     Neuro/Psych TIA   GI/Hepatic   Endo/Other    Renal/GU      Musculoskeletal   Abdominal   Peds  Hematology   Anesthesia Other Findings   Reproductive/Obstetrics                           Anesthesia Physical Anesthesia Plan  ASA: III  Anesthesia Plan: General   Post-op Pain Management:    Induction: Intravenous  Airway Management Planned: Mask  Additional Equipment:   Intra-op Plan:   Post-operative Plan:   Informed Consent: I have reviewed the patients History and Physical, chart, labs and discussed the procedure including the risks, benefits and alternatives for the proposed anesthesia with the patient or authorized representative who has indicated his/her understanding and acceptance.     Plan Discussed with: CRNA and Surgeon  Anesthesia Plan Comments:         Anesthesia Quick Evaluation

## 2012-11-14 ENCOUNTER — Encounter (HOSPITAL_COMMUNITY): Payer: Self-pay | Admitting: Cardiology

## 2012-11-14 ENCOUNTER — Ambulatory Visit: Payer: Medicare Other | Admitting: Pulmonary Disease

## 2012-11-15 ENCOUNTER — Ambulatory Visit: Payer: Medicare Other | Admitting: Cardiology

## 2012-11-26 ENCOUNTER — Encounter: Payer: Self-pay | Admitting: Internal Medicine

## 2012-11-27 ENCOUNTER — Telehealth: Payer: Self-pay | Admitting: Cardiology

## 2012-11-27 ENCOUNTER — Encounter: Payer: Medicare Other | Admitting: Nurse Practitioner

## 2012-11-27 MED ORDER — PROPAFENONE HCL ER 225 MG PO CP12: 225.0000 mg | ORAL_CAPSULE | Freq: Three times a day (TID) | ORAL | Status: DC

## 2012-11-27 NOTE — Telephone Encounter (Signed)
New problem    Prime mail    Propafenone  225 mg

## 2012-12-04 ENCOUNTER — Encounter: Payer: Self-pay | Admitting: Pulmonary Disease

## 2012-12-04 ENCOUNTER — Ambulatory Visit (INDEPENDENT_AMBULATORY_CARE_PROVIDER_SITE_OTHER): Payer: Medicare Other | Admitting: Pulmonary Disease

## 2012-12-04 VITALS — BP 112/68 | HR 96 | Temp 97.0°F | Ht 63.0 in | Wt 171.0 lb

## 2012-12-04 DIAGNOSIS — R05 Cough: Secondary | ICD-10-CM

## 2012-12-04 DIAGNOSIS — J449 Chronic obstructive pulmonary disease, unspecified: Secondary | ICD-10-CM

## 2012-12-04 MED ORDER — OMEPRAZOLE 40 MG PO CPDR: 40.0000 mg | DELAYED_RELEASE_CAPSULE | Freq: Two times a day (BID) | ORAL | Status: DC

## 2012-12-04 NOTE — Assessment & Plan Note (Signed)
From the patient's description of her cough, I believe this is upper airway in origin.  I initially thought this may be related to her underlying obstructive lung disease, but now she is noting worsening cough after eating and also upon lying down at night.  I would like to treat her more aggressively for reflux disease, as well as postnasal drip and see if she improves.

## 2012-12-04 NOTE — Progress Notes (Signed)
  Subjective:    Patient ID: Mikayla Harper, female    DOB: 03-Apr-1930, 79 y.o.   MRN: [id number]  HPI Patient comes in today for followup of her known COPD and also chronic cough.  I initially thought the 2 issues were related, but the patient did not see any improvement in her breathing or her cough with Spiriva and a short prednisone pulse.  She does use albuterol, and feels this helps her more than anything else with respect to her breathing.  Her cough can occur during the day, but it is especially worse at night after eating, and also upon lying down.  She does not feel significant reflux symptoms or postnasal drip currently.   Review of Systems  Constitutional: Negative for fever and unexpected weight change.  HENT: Negative for ear pain, nosebleeds, congestion, sore throat, rhinorrhea, sneezing, trouble swallowing, dental problem, postnasal drip and sinus pressure.   Eyes: Negative for redness and itching.  Respiratory: Positive for cough, chest tightness, shortness of breath and wheezing.   Cardiovascular: Negative for palpitations and leg swelling.  Gastrointestinal: Negative for nausea and vomiting.  Genitourinary: Negative for dysuria.  Musculoskeletal: Negative for joint swelling.  Skin: Negative for rash.  Neurological: Negative for headaches.  Hematological: Does not bruise/bleed easily.  Psychiatric/Behavioral: Negative for dysphoric mood. The patient is not nervous/anxious.        Objective:   Physical Exam Overweight female in no acute distress Nose without purulence or discharge noted Oropharynx clear Neck without JVD or lymphadenopathy Chest with mildly decreased breath sounds, no wheezes or rhonchi Cardiac exam was regular rate and rhythm today Lower extremities with mild edema, no cyanosis Alert and oriented, moves all 4 extremities.       Assessment & Plan:

## 2012-12-04 NOTE — Patient Instructions (Addendum)
Stop spiriva Trial of dulera 100/5  Take 2 puffs in am and pm everyday, and keep mouth rinsed well after using. Only use albuterol if needed for rescue. Take omeprazole 40mg  one in am and pm for next 3 weeks. Take zyrtec 10mg  over the counter everynight at bedtime for next 3 weeks. Please call me in 3 weeks to give update on cough and if the dulera helps your breathing.

## 2012-12-04 NOTE — Addendum Note (Signed)
Addended by: Nita Sells on: 12/04/2012 11:18 AM   Modules accepted: Orders

## 2012-12-04 NOTE — Assessment & Plan Note (Signed)
The patient did not see any improvement in her breathing with Spiriva, and feels the albuterol helps her more than anything else.  We'll therefore try her on dulera to see if this makes a difference.  I have also stressed to her the importance of weight loss and some type of conditioning program.  Also reminded her that her underlying heart disease can also contribute to her dyspnea on exertion.

## 2012-12-05 ENCOUNTER — Ambulatory Visit (INDEPENDENT_AMBULATORY_CARE_PROVIDER_SITE_OTHER): Payer: Medicare Other | Admitting: Nurse Practitioner

## 2012-12-05 ENCOUNTER — Encounter: Payer: Self-pay | Admitting: Nurse Practitioner

## 2012-12-05 ENCOUNTER — Other Ambulatory Visit: Payer: Self-pay | Admitting: *Deleted

## 2012-12-05 VITALS — BP 128/74 | HR 66 | Ht 63.0 in | Wt 170.8 lb

## 2012-12-05 DIAGNOSIS — I4891 Unspecified atrial fibrillation: Secondary | ICD-10-CM

## 2012-12-05 DIAGNOSIS — I480 Paroxysmal atrial fibrillation: Secondary | ICD-10-CM

## 2012-12-05 MED ORDER — PROPAFENONE HCL 225 MG PO TABS: 225.0000 mg | ORAL_TABLET | Freq: Three times a day (TID) | ORAL | Status: DC

## 2012-12-05 NOTE — Progress Notes (Signed)
Mikayla Harper. Sampedro Date of Birth: 1929-12-29 Medical Record #[id number]  History of Present Illness: Mikayla Harper is seen back today for a post hospital visit. She is post cardioversion. She is seen for Dr. Graciela Husbands and Dr. Swaziland. She has had PAF - now on plain Rythmol 225 mg TID with successful cardioversion. Other issues include chronic anticoagulation - on Xarelto - not tolerated Pradaxa, past TIA, HLD, underlying pacemaker for tachybrady. Other issues as noted below. Past echo in January 2013 showed normal LV function with diastolic dysfunction.   She had cardioversion on February 18th - this was successful.   She comes in today. Feels great. Looks great. Almost out of her medicine though - she is actually taking the plain Rythmol 225 mg TID and wants to continue. She is back playing golf. Still with some dyspnea and cough but that has improved with having Dulera. She is very happy with how she is doing.   Current Outpatient Prescriptions on File Prior to Visit  Medication Sig Dispense Refill  . diltiazem (CARDIZEM CD) 120 MG 24 hr capsule Take 1 capsule (120 mg total) by mouth daily.  90 capsule  3  . ferrous sulfate 325 (65 FE) MG tablet Take 325 mg by mouth daily with breakfast.      . lovastatin (MEVACOR) 20 MG tablet Take 20 mg by mouth at bedtime.      Marland Kitchen MAGNESIUM CARBONATE PO Take by mouth daily.      . Multiple Vitamins-Minerals (WOMENS MULTIVITAMIN PLUS PO) Take 1 tablet by mouth daily.      Marland Kitchen omeprazole (PRILOSEC) 40 MG capsule Take 1 capsule (40 mg total) by mouth 2 (two) times daily.  60 capsule  0  . oxybutynin (DITROPAN) 5 MG tablet Take 5 mg by mouth daily.       . potassium gluconate (SM POTASSIUM) 595 MG TABS Take 595 mg by mouth daily.      . temazepam (RESTORIL) 15 MG capsule Take 15 mg by mouth as needed.      . tiotropium (SPIRIVA HANDIHALER) 18 MCG inhalation capsule Place 18 mcg into inhaler and inhale daily.      Mikayla Harper 15 MG TABS tablet Take 1 tablet by mouth daily.        No current facility-administered medications on file prior to visit.    Allergies  Allergen Reactions  . Lisinopril Other (See Comments)    Dizzy and lightheadedness    Past Medical History  Diagnosis Date  . PAF (paroxysmal atrial fibrillation)     previously on propafenone  . Carpal tunnel syndrome of left wrist 12/20/2010    left hand  . Influenza-like illness 2009    H1N1 PCR negative  . Urinary tract infection   . Hyperlipidemia     on Mevacor  . Osteopenia   . TIA (transient ischemic attack) Dec 2012  . Tuberculosis      hx of over 50 years ago "  . Sick sinus syndrome   . Pacemaker - [facility]     Implant Jan 2013  . Anemia     Past Surgical History  Procedure Laterality Date  . Back surgery  1976  . Other surgical history  In the late 1960's    Hysterectomy secondary to fibroids  . Other surgical history      cataract surgery  . Abdominal hysterectomy    . Insert / replace / remove pacemaker  10/21/2011  . Esophagogastroduodenoscopy  08/01/2012    Procedure:  ESOPHAGOGASTRODUODENOSCOPY (EGD);  Surgeon: Rachael Fee, MD;  Location: [facility];  Service: Endoscopy;  Laterality: N/A;  . Colonoscopy  08/01/2012    Procedure: COLONOSCOPY;  Surgeon: Rachael Fee, MD;  Location: [facility];  Service: Endoscopy;  Laterality: N/A;  . Cardioversion N/A 11/13/2012    Procedure: CARDIOVERSION;  Surgeon: Peter M Swaziland, MD;  Location: [facility];  Service: Cardiovascular;  Laterality: N/A;    History  Smoking status  . Former Smoker -- 1.00 packs/day for 30 years  . Types: Cigarettes  . Quit date: 10/05/1988  Smokeless tobacco  . Never Used    History  Alcohol Use No    Family History  Problem Relation Age of Onset  . Dementia Mother   . Emphysema Father   . Heart disease Father   . Lymphoma Daughter     in bone    Review of Systems: The review of systems is per the HPI.  All other systems were reviewed and are negative.  Physical  Exam: BP 128/74  Pulse 66  Ht 5\' 3"  (1.6 m)  Wt 170 lb 12.8 oz (77.474 kg)  BMI 30.26 kg/m2 Patient is very pleasant and in no acute distress. Skin is warm and dry. Color is normal.  HEENT is unremarkable. Normocephalic/atraumatic. PERRL. Sclera are nonicteric. Neck is supple. No masses. No JVD. Lungs are clear. Cardiac exam shows a regular rate and rhythm. Abdomen is soft. Extremities are without edema. Gait and ROM are intact. No gross neurologic deficits noted.  LABORATORY DATA: EKG today shows A pacing. She is in sinus.   Cardioversion Note Patient placed on cardiac monitor, pulse oximetry, supplemental oxygen as necessary.  Sedation given: Short-acting barbiturates  Pacer pads placed anterior and posterior chest.  Cardioverted 2 time(s).  Cardioverted at 150J.  Evaluation  Findings: Post procedure EKG shows: NSR  Complications: None  Patient did tolerate procedure well.  Peter Swaziland MD,FACC  11/13/2012, 10:50 AM     Chemistry      Component Value Date/Time   NA 136 08/27/2012 0814   K 4.2 08/27/2012 0814   CL 105 08/27/2012 0814   CO2 26 08/27/2012 0814   BUN 18 08/27/2012 0814   CREATININE 1.0 08/27/2012 0814      Component Value Date/Time   CALCIUM 9.6 08/27/2012 0814   ALKPHOS 57 10/11/2011 1206   AST 27 10/11/2011 1206   ALT 16 10/11/2011 1206   BILITOT 0.8 10/11/2011 1206     Lab Results  Component Value Date   WBC 6.5 10/30/2012   HGB 12.9 10/30/2012   HCT 39.3 10/30/2012   MCV 92.7 10/30/2012   PLT 236.0 10/30/2012    Lab Results  Component Value Date   CHOL 195 10/11/2011   HDL 74.50 10/11/2011   LDLCALC 102* 10/11/2011   TRIG 94.0 10/11/2011   CHOLHDL 3 10/11/2011   Assessment / Plan: 1. PAF - back on Rythmol and with recent cardioversion - she remains in sinus. Clinically she is much improved and very happy with how she feels.   2. Anticoagulation - chronic - on Xarelto  3. Underlying pacemaker - has to check here in the office (no land line phone).   Will see  her back later this summer as planned. No change in her medicines. We have refilled them today. Encouraged her to stay active.   Patient is agreeable to this plan and will call if any problems develop in the interim.   Rosalio Macadamia, RN, ANP-C [facility]  [address] [place], [place]  [address]

## 2012-12-05 NOTE — Telephone Encounter (Signed)
S/w  [facility] to discontinue Rythmol er and added Rythmol hcl, pt preferred the rythmol hcl will be sent to her house

## 2012-12-05 NOTE — Patient Instructions (Addendum)
I think you are doing great  Stay active  Stay on your medicines  Call the [facility] office at [phone] if you have any questions, problems or concerns.

## 2012-12-11 ENCOUNTER — Other Ambulatory Visit: Payer: Self-pay | Admitting: *Deleted

## 2012-12-12 ENCOUNTER — Telehealth: Payer: Self-pay | Admitting: *Deleted

## 2012-12-12 MED ORDER — PROPAFENONE HCL 225 MG PO TABS: 225.0000 mg | ORAL_TABLET | Freq: Three times a day (TID) | ORAL | Status: DC

## 2012-12-12 NOTE — Telephone Encounter (Signed)
Patient calling about rx Propafenone 225mg . She was told by someone from [facility] we needed to "straighten out" her rx request for Propafenone before they can send it to her. Encounter date 12/05/12 states she had this rx changed from ER to HCL in her office visit per her request and Duwayne Heck spoke with [facility] about this medication. They deny ever speaking to anyone about this to the patient. I discontinued last order and reordered Propafenone HCL 225mg  as original request stated asking them to call or fax our office if there are any questions or problems with this order. The number they gave her was not an actual number patient stated and wanted me to resent thru fax (escribe).  Patient states she has enough to last about 90days so she does not need a rx [facility].  Will forward this to [facility] for her on records of what [facility] said.   Micki Riley, CMA

## 2012-12-25 ENCOUNTER — Telehealth: Payer: Self-pay | Admitting: Pulmonary Disease

## 2012-12-25 MED ORDER — OMEPRAZOLE 40 MG PO CPDR: 40.0000 mg | DELAYED_RELEASE_CAPSULE | Freq: Two times a day (BID) | ORAL | Status: DC

## 2012-12-25 NOTE — Telephone Encounter (Signed)
She can change zyrtec to as needed at bedtime, but with upcoming allergy season, may need to take everyday for awhile.  See how you do. Would stay on omeprazole 40mg  bid for another month, then decrease to once a day thereafter.  Ok to send script. Would like her to check on price for symbicort 160/4.5, and maybe this will be cheaper.

## 2012-12-25 NOTE — Telephone Encounter (Signed)
Spoke with patient. Patient states she feels the [facility] 100/5 is working "pretty good" also stated the 40 mg omeprazole bid also helped Patient stated if she is to stay on omeprazole she will need a Rx sent in for this. Patient would like to stay with [facility] however she stated a 90 day supply of this was $175 through mail-order and she can not afford this. Dr. Shelle Iron please advise, thank you   Last OV: 12/04/12 Patient Instructions    Stop spiriva  Trial of dulera 100/5 Take 2 puffs in am and pm everyday, and keep mouth rinsed well after using.  Only use albuterol if needed for rescue.  Take omeprazole 40mg  one in am and pm for next 3 weeks.  Take zyrtec 10mg  over the counter everynight at bedtime for next 3 weeks.  Please call me in 3 weeks to give update on cough and if the dulera helps your breathing.

## 2012-12-25 NOTE — Telephone Encounter (Signed)
Spoke with pt and gave Dr Shelle Iron recommendations.  [facility] [address]  Pt will check on price of Symbicort and let us know.

## 2012-12-26 ENCOUNTER — Telehealth: Payer: Self-pay | Admitting: Pulmonary Disease

## 2012-12-26 MED ORDER — BUDESONIDE-FORMOTEROL FUMARATE 160-4.5 MCG/ACT IN AERO: 2.0000 | INHALATION_SPRAY | Freq: Two times a day (BID) | RESPIRATORY_TRACT | Status: DC

## 2012-12-26 NOTE — Telephone Encounter (Signed)
Spoke with pt and she states that Symbicort is more affordable for her .  Per Dr Shelle Iron rx for symbicort 160 2 puffs bid sent to [facility]

## 2013-01-07 ENCOUNTER — Encounter: Payer: Self-pay | Admitting: Pulmonary Disease

## 2013-01-07 ENCOUNTER — Ambulatory Visit (INDEPENDENT_AMBULATORY_CARE_PROVIDER_SITE_OTHER): Payer: Medicare Other | Admitting: Pulmonary Disease

## 2013-01-07 VITALS — BP 126/72 | HR 90 | Temp 98.7°F | Ht 63.0 in | Wt 171.6 lb

## 2013-01-07 DIAGNOSIS — J441 Chronic obstructive pulmonary disease with (acute) exacerbation: Secondary | ICD-10-CM

## 2013-01-07 MED ORDER — CEFDINIR 300 MG PO CAPS: 300.0000 mg | ORAL_CAPSULE | Freq: Two times a day (BID) | ORAL | Status: DC

## 2013-01-07 MED ORDER — PREDNISONE 10 MG PO TABS: ORAL_TABLET | ORAL | Status: DC

## 2013-01-07 MED ORDER — HYDROCODONE-HOMATROPINE 5-1.5 MG/5ML PO SYRP: 5.0000 mL | ORAL_SOLUTION | Freq: Four times a day (QID) | ORAL | Status: DC | PRN

## 2013-01-07 NOTE — Patient Instructions (Addendum)
Will treat with a course of prednisone to get you thru this. Start omnicef 300mg  two each am starting today for 5 days. Will treat with cough medication as needed.  Will send this in for you. Let us know if you are not improving.

## 2013-01-07 NOTE — Progress Notes (Signed)
  Subjective:    Patient ID: Mikayla Harper, female    DOB: 07/04/1930, 79 y.o.   MRN: [id number]  HPI Patient comes in today for acute sick visit.  She has known COPD, and gives a four-day history of increasing chest tightness, wheezing, as well as increased shortness of breath.  She has had increased cough with increased congestion, but is not able to produce any mucus at this time.  She has a rescue inhaler available and has been using it with some relief.  She has not had any fevers or chills.   Review of Systems  Constitutional: Negative for fever and unexpected weight change.  HENT: Negative for ear pain, nosebleeds, congestion, sore throat, rhinorrhea, sneezing, trouble swallowing, dental problem, postnasal drip and sinus pressure.   Eyes: Negative for redness and itching.  Respiratory: Positive for cough, shortness of breath and wheezing. Negative for chest tightness.   Cardiovascular: Negative for palpitations and leg swelling.  Gastrointestinal: Negative for nausea and vomiting.  Genitourinary: Negative for dysuria.  Musculoskeletal: Negative for joint swelling.  Skin: Negative for rash.  Neurological: Negative for headaches.  Hematological: Does not bruise/bleed easily.  Psychiatric/Behavioral: Positive for sleep disturbance. Negative for dysphoric mood. The patient is not nervous/anxious.        Objective:   Physical Exam Well-developed female in no acute distress Nose without purulence or discharge noted Oropharynx clear Neck without lymphadenopathy or thyromegaly Chest with diffuse rhonchi, adequate air flow, no wheezing Cardiac exam with regular rate and rhythm, although she does have a history of atrial fibrillation Lower extremities with no significant edema, no cyanosis Alert and oriented, moves all 4 extremities.       Assessment & Plan:

## 2013-01-07 NOTE — Assessment & Plan Note (Signed)
The patient is having symptoms of an acute COPD exacerbation.  She has had increasing shortness of breath, chest tightness with wheezing, as well as a cough.  I suspect she is developing acute bronchitis leading to her flareup.  We'll treat her with a course of prednisone as well as an antibiotic, and she is to let us know if not improving.

## 2013-01-23 ENCOUNTER — Other Ambulatory Visit (HOSPITAL_COMMUNITY): Payer: Self-pay | Admitting: *Deleted

## 2013-01-24 ENCOUNTER — Ambulatory Visit (HOSPITAL_COMMUNITY)
Admission: RE | Admit: 2013-01-24 | Discharge: 2013-01-24 | Disposition: A | Discharge status: Home / Self Care | Payer: Medicare Other | Source: Ambulatory Visit | Attending: Internal Medicine | Admitting: Internal Medicine

## 2013-01-24 DIAGNOSIS — D649 Anemia, unspecified: Secondary | ICD-10-CM | POA: Insufficient documentation

## 2013-01-25 ENCOUNTER — Encounter (HOSPITAL_COMMUNITY): Payer: Medicare Other

## 2013-01-25 LAB — TYPE AND SCREEN: Unit division: 0

## 2013-01-28 ENCOUNTER — Encounter (HOSPITAL_COMMUNITY): Payer: Medicare Other

## 2013-01-29 ENCOUNTER — Telehealth: Payer: Self-pay | Admitting: Pulmonary Disease

## 2013-01-29 ENCOUNTER — Encounter: Payer: Self-pay | Admitting: Pulmonary Disease

## 2013-01-29 ENCOUNTER — Ambulatory Visit (INDEPENDENT_AMBULATORY_CARE_PROVIDER_SITE_OTHER): Payer: Medicare Other | Admitting: Pulmonary Disease

## 2013-01-29 VITALS — BP 118/70 | HR 72 | Temp 97.5°F | Ht 65.0 in | Wt 166.4 lb

## 2013-01-29 DIAGNOSIS — R059 Cough, unspecified: Secondary | ICD-10-CM

## 2013-01-29 DIAGNOSIS — J449 Chronic obstructive pulmonary disease, unspecified: Secondary | ICD-10-CM

## 2013-01-29 DIAGNOSIS — R05 Cough: Secondary | ICD-10-CM

## 2013-01-29 DIAGNOSIS — J4489 Other specified chronic obstructive pulmonary disease: Secondary | ICD-10-CM

## 2013-01-29 MED ORDER — TRAMADOL HCL 50 MG PO TABS: 50.0000 mg | ORAL_TABLET | Freq: Four times a day (QID) | ORAL | Status: DC | PRN

## 2013-01-29 NOTE — Progress Notes (Signed)
  Subjective:    Patient ID: Mikayla Harper, female    DOB: 08/08/1930, 79 y.o.   MRN: [id number]  HPI The patient comes in today for an acute sick visit.  She has known COPD, and had an acute exacerbation approximately 3 weeks ago.  She responded favorably to a course of prednisone and antibiotics, but a dry and hacking cough has persisted.  She has had issues with cyclical coughing in the past.  She has been staying on her antihistamine and also medication for reflux, and is not having breakthrough symptoms.  She does feel a tickle in her throat, and will occasionally bring small quantities of clear mucus.  She does not have sinus pressure or congestion.  She does admit to clearing her throat frequently, and that cough drops will often relieve the cough during the day.   Review of Systems  Constitutional: Negative for fever and unexpected weight change.  HENT: Negative for ear pain, nosebleeds, congestion, sore throat, rhinorrhea, sneezing, trouble swallowing, dental problem, postnasal drip and sinus pressure.   Eyes: Negative for redness and itching.  Respiratory: Positive for cough and wheezing. Negative for chest tightness and shortness of breath.   Cardiovascular: Positive for chest pain ( rib pain). Negative for palpitations and leg swelling.  Gastrointestinal: Negative for nausea and vomiting.  Genitourinary: Negative for dysuria.  Musculoskeletal: Negative for joint swelling.  Skin: Negative for rash.  Neurological: Negative for headaches.  Hematological: Does not bruise/bleed easily.  Psychiatric/Behavioral: Negative for dysphoric mood. The patient is not nervous/anxious.        Objective:   Physical Exam Well-developed female in no acute distress Nose with purulent discharge noted Oropharynx clear Neck without lymphadenopathy or thyromegaly Chest with decreased breath sounds, but no wheezes or rhonchi Cardiac exam with regular rate and rhythm Lower extremities with minimal  edema, no cyanosis Alert and oriented, moves all 4 extremities.       Assessment & Plan:

## 2013-01-29 NOTE — Patient Instructions (Addendum)
Stay on your current breathing medications Stay on medication for acid reflux and for allergies. Will try you on tramadol for cough.  Take one every 6hrs if needed for cough.  The first week may want to take regularly, then as needed. Limit voice use as much as possible (no singing or yelling, no prolonged conversation) No throat clearing!! This makes cough worse. Use hard candy (no cough drops or mint) during the day to bathe back of throat and help prevent cough. Please call me in 2 weeks with how things are going.

## 2013-01-29 NOTE — Assessment & Plan Note (Signed)
The patient's cough continues to sound more upper airway in origin.  She does not see any change with aggressive treatment of reflux and postnasal drip.  I still think she has a cyclical component to her cough, and we'll try her on behavioral therapies and also tramadol to see if it helps.  If her cough continues, would consider checking a repeat chest x-ray and possibly a CT chest.

## 2013-01-29 NOTE — Telephone Encounter (Signed)
i spoke with pt. She is scheduled to come in and see KC at 1:30 today. Nothing further was needed

## 2013-01-29 NOTE — Addendum Note (Signed)
Addended by: Nita Sells on: 01/29/2013 02:03 PM   Modules accepted: Orders

## 2013-01-29 NOTE — Assessment & Plan Note (Signed)
The patient appears to be doing well from a COPD standpoint.  She is not having any further shortness of breath above her usual baseline.  I have asked her to continue on her current medications.

## 2013-02-06 ENCOUNTER — Telehealth: Payer: Self-pay | Admitting: Pulmonary Disease

## 2013-02-06 DIAGNOSIS — J449 Chronic obstructive pulmonary disease, unspecified: Secondary | ICD-10-CM

## 2013-02-06 MED ORDER — PREDNISONE 10 MG PO TABS: ORAL_TABLET | ORAL | Status: DC

## 2013-02-06 NOTE — Telephone Encounter (Signed)
I want her to continue doing everything we discussed, and will call in a course of prednisone to help with inflammation.  I would also like her to come by office and get cxr, and I will call her with results. Prednisone 40mg  qday for 2 days, then 30/day for 2 days, then 20/day for 2 days, then stop.  (QS, no fills)

## 2013-02-06 NOTE — Telephone Encounter (Signed)
I spoke with pt. She stated she is still doing right much coughing and is bringing up clear phlem and is wheezing. She does not feel like she is having any drainage. She was taking the tramadol 1 every 6 hrs, was doing no throat clearing using hard candy and was sipping on water when had the urge to cough. Pt is requesting recs. Please advise Dr. Shelle Iron thanks  Allergies  Allergen Reactions  . Lisinopril Other (See Comments)    Dizzy and lightheadedness

## 2013-02-06 NOTE — Telephone Encounter (Signed)
Spoke with the pt and notified of recs per [facility] She verbalized understanding Order was placed for cxr The pred taper sent to pharm

## 2013-02-07 ENCOUNTER — Ambulatory Visit (INDEPENDENT_AMBULATORY_CARE_PROVIDER_SITE_OTHER)
Admission: RE | Admit: 2013-02-07 | Discharge: 2013-02-07 | Disposition: A | Discharge status: Home / Self Care | Payer: Medicare Other | Source: Ambulatory Visit | Attending: Pulmonary Disease | Admitting: Pulmonary Disease

## 2013-02-07 DIAGNOSIS — J449 Chronic obstructive pulmonary disease, unspecified: Secondary | ICD-10-CM

## 2013-02-07 DIAGNOSIS — J4489 Other specified chronic obstructive pulmonary disease: Secondary | ICD-10-CM

## 2013-02-12 ENCOUNTER — Telehealth: Payer: Self-pay | Admitting: Pulmonary Disease

## 2013-02-12 NOTE — Telephone Encounter (Signed)
I spoke with pt and is aware. She voiced her understanding and needed nothing further.

## 2013-02-12 NOTE — Telephone Encounter (Signed)
Notes Recorded by Barbaraann Share, MD on 02/07/2013 at 10:11 AM Please let pt know that her cxr is unchanged compared to previous. Nothing new to explain her cough.

## 2013-02-12 NOTE — Progress Notes (Signed)
Quick Note:  Spoke with patient,made her aware of results as listed below per Dr. Shelle Iron. Patient verbalized understanding and nothing further needed at this time ______

## 2013-02-12 NOTE — Telephone Encounter (Signed)
Spoke with patient, patient made aware of results as listed below per Dr. Shelle Iron. Patient states she feel "the best she has ever felt" Patient would like to know if theres a low dose prednisone she could take daily to keep her sx at bay. Dr. Shelle Iron please advise, thank you

## 2013-02-12 NOTE — Telephone Encounter (Signed)
We try to avoid chronic prednisone unless there is no other answer. Hopefully, she will get lasting improvement even after finishing the prednisone.  Sometimes it is doing everything at one time rather than doing things one at a time.  Continue with other treatments for now to see how things go.

## 2013-03-01 ENCOUNTER — Telehealth: Payer: Self-pay | Admitting: Pulmonary Disease

## 2013-03-01 MED ORDER — HYDROCODONE-HOMATROPINE 5-1.5 MG/5ML PO SYRP: 5.0000 mL | ORAL_SOLUTION | Freq: Four times a day (QID) | ORAL | Status: DC | PRN

## 2013-03-01 NOTE — Telephone Encounter (Signed)
Called and spoke with pt and she stated that she has been coughing x 3 days.  Has taken all of her meds up in the last 3 days.  Cough with clear sputum.  Pt stated that she has hoarseness.  She stated that she gets very SOB  With coughing and has no energy at all.  She is using her inhalers as directed and the rescue inhaler as needed.  Denies any fever.  Pt is requesting recs from [facility].  Please advise. Thanks  Allergies  Allergen Reactions  . Lisinopril Other (See Comments)    Dizzy and lightheadedness

## 2013-03-01 NOTE — Telephone Encounter (Signed)
Called and spoke with pt and she is aware of KC recs for the hydromet  120 ml  1 tsp every 6 hours as needed for cough.  No refills. This has been called to the pts pharmacy and she is aware.

## 2013-03-01 NOTE — Telephone Encounter (Signed)
Her phone call on 5/20:  "I feel the best I have ever felt" If she is not bringing up yellow green stuff, can call in hydromet 5 cc q6hr prn cough., #4 ounces, no fills.  She must have ov if she is continuing to cough.

## 2013-03-11 ENCOUNTER — Observation Stay (HOSPITAL_COMMUNITY)
Admission: AD | Admit: 2013-03-11 | Discharge: 2013-03-12 | Disposition: A | Discharge status: Home / Self Care | Payer: Medicare Other | Source: Ambulatory Visit | Attending: Internal Medicine | Admitting: Internal Medicine

## 2013-03-11 DIAGNOSIS — Z950 Presence of cardiac pacemaker: Secondary | ICD-10-CM | POA: Insufficient documentation

## 2013-03-11 DIAGNOSIS — N318 Other neuromuscular dysfunction of bladder: Secondary | ICD-10-CM | POA: Insufficient documentation

## 2013-03-11 DIAGNOSIS — Z79899 Other long term (current) drug therapy: Secondary | ICD-10-CM | POA: Insufficient documentation

## 2013-03-11 DIAGNOSIS — I129 Hypertensive chronic kidney disease with stage 1 through stage 4 chronic kidney disease, or unspecified chronic kidney disease: Secondary | ICD-10-CM | POA: Insufficient documentation

## 2013-03-11 DIAGNOSIS — M1710 Unilateral primary osteoarthritis, unspecified knee: Secondary | ICD-10-CM | POA: Insufficient documentation

## 2013-03-11 DIAGNOSIS — D649 Anemia, unspecified: Principal | ICD-10-CM | POA: Insufficient documentation

## 2013-03-11 DIAGNOSIS — I4891 Unspecified atrial fibrillation: Secondary | ICD-10-CM | POA: Insufficient documentation

## 2013-03-11 DIAGNOSIS — J449 Chronic obstructive pulmonary disease, unspecified: Secondary | ICD-10-CM | POA: Insufficient documentation

## 2013-03-11 DIAGNOSIS — G4700 Insomnia, unspecified: Secondary | ICD-10-CM | POA: Insufficient documentation

## 2013-03-11 DIAGNOSIS — N183 Chronic kidney disease, stage 3 unspecified: Secondary | ICD-10-CM | POA: Insufficient documentation

## 2013-03-11 DIAGNOSIS — E785 Hyperlipidemia, unspecified: Secondary | ICD-10-CM | POA: Insufficient documentation

## 2013-03-11 DIAGNOSIS — J4489 Other specified chronic obstructive pulmonary disease: Secondary | ICD-10-CM | POA: Insufficient documentation

## 2013-03-11 LAB — PREPARE RBC (CROSSMATCH)

## 2013-03-11 MED ORDER — ACETAMINOPHEN 325 MG PO TABS
650.0000 mg | ORAL_TABLET | Freq: Four times a day (QID) | ORAL | Status: DC | PRN
  Administered 2013-03-12: 650 mg via ORAL
  Filled 2013-03-11 (×2): qty 2

## 2013-03-11 MED ORDER — ACETAMINOPHEN 325 MG PO TABS
650.0000 mg | ORAL_TABLET | Freq: Once | ORAL | Status: AC
  Administered 2013-03-12: 650 mg via ORAL

## 2013-03-11 MED ORDER — ONDANSETRON HCL 4 MG PO TABS: 4.0000 mg | ORAL_TABLET | Freq: Four times a day (QID) | ORAL | Status: DC | PRN

## 2013-03-11 MED ORDER — ALBUTEROL SULFATE (5 MG/ML) 0.5% IN NEBU: 2.5000 mg | INHALATION_SOLUTION | RESPIRATORY_TRACT | Status: DC | PRN

## 2013-03-11 MED ORDER — WOMENS MULTIVITAMIN PLUS PO TABS: ORAL_TABLET | Freq: Every morning | ORAL | Status: DC

## 2013-03-11 MED ORDER — ACETAMINOPHEN 650 MG RE SUPP: 650.0000 mg | Freq: Four times a day (QID) | RECTAL | Status: DC | PRN

## 2013-03-11 MED ORDER — FERROUS SULFATE 325 (65 FE) MG PO TABS
325.0000 mg | ORAL_TABLET | Freq: Every day | ORAL | Status: DC
  Administered 2013-03-12: 325 mg via ORAL
  Filled 2013-03-11 (×2): qty 1

## 2013-03-11 MED ORDER — POTASSIUM CHLORIDE IN NACL 20-0.9 MEQ/L-% IV SOLN
INTRAVENOUS | Status: DC
  Filled 2013-03-11: qty 1000

## 2013-03-11 MED ORDER — TEMAZEPAM 15 MG PO CAPS: 15.0000 mg | ORAL_CAPSULE | Freq: Every evening | ORAL | Status: DC | PRN

## 2013-03-11 MED ORDER — OXYBUTYNIN CHLORIDE 5 MG PO TABS
5.0000 mg | ORAL_TABLET | Freq: Every day | ORAL | Status: DC
  Administered 2013-03-12 (×2): 5 mg via ORAL
  Filled 2013-03-11 (×3): qty 1

## 2013-03-11 MED ORDER — ADULT MULTIVITAMIN W/MINERALS CH
1.0000 | ORAL_TABLET | Freq: Every day | ORAL | Status: DC
  Administered 2013-03-12 (×2): 1 via ORAL
  Filled 2013-03-11 (×3): qty 1

## 2013-03-11 MED ORDER — MOMETASONE FURO-FORMOTEROL FUM 100-5 MCG/ACT IN AERO
2.0000 | INHALATION_SPRAY | Freq: Two times a day (BID) | RESPIRATORY_TRACT | Status: DC
  Administered 2013-03-12: 2 via RESPIRATORY_TRACT
  Filled 2013-03-11: qty 8.8

## 2013-03-11 MED ORDER — ONDANSETRON HCL 4 MG/2ML IJ SOLN: 4.0000 mg | Freq: Four times a day (QID) | INTRAMUSCULAR | Status: DC | PRN

## 2013-03-11 MED ORDER — PANTOPRAZOLE SODIUM 40 MG PO TBEC
40.0000 mg | DELAYED_RELEASE_TABLET | Freq: Every day | ORAL | Status: DC
  Administered 2013-03-12: 40 mg via ORAL
  Filled 2013-03-11 (×2): qty 1

## 2013-03-11 MED ORDER — HYDROCODONE-ACETAMINOPHEN 5-325 MG PO TABS: 1.0000 | ORAL_TABLET | ORAL | Status: DC | PRN

## 2013-03-11 MED ORDER — SIMVASTATIN 20 MG PO TABS
20.0000 mg | ORAL_TABLET | Freq: Every day | ORAL | Status: DC
  Filled 2013-03-11: qty 1

## 2013-03-11 NOTE — H&P (Signed)
Vital Signs  Entered weight: 167 lbs., Calculated Weight: 167 lbs., ( 75.75 kg)  Height: 66.5 in., ( 168.91 cm)  Pulse rate: 64 Pulse rhythm: regular  Blood Pressure #1: 118 / 56 mm Hg  BMI: 26.55 BSA: 1.86 Wt Chg: 0 lbs since 01/22/2013  Vitals entered by: Ellery Plunk CMA on March 11, 2013 3:01 PM  Risk Factors:  Smoked Tobacco Use: Former smoker  Years Since Last Quit: 29  Counseled : No  Passive smoke exposure: no  Drug use: no  HIV high-risk behavior: no  Caffeine use: 1 drinks per day  Alcohol use: no  Exercise: yes  Times per week: 3  Type of Exercise: walk  Seatbelt use: 100 %  Sun Exposure: occasionally  Previous Tobacco Use: Signed On - 08/23/2010  Smoked Tobacco Use: former smoker  Pack-years: 35  Year quit: 1985  Years Since Last Quit: 29 years, 5 months, 15 days  Passive smoke exposure: no  Drug use: no  HIV high-risk behavior: no  Caffeine use: 1 drinks per day  Previous Alcohol Use: Signed On - 04/16/2009  Alcohol use: no  Exercise: yes  Times per week: 3  Type of Exercise: walk  Seatbelt use: 100 %  Sun Exposure: occasionally  Colonoscopy History:  Date of Last Colonoscopy: 08/01/2012  Mammogram History:  Date of Last Mammogram: 06/30/2011  History of Present Illness  Reason for visit: Work-In Sick Visit  Chief Complaint: Pt said that she has been feeling really weak and has no energy at all. Thinks that her blood may be low again.  History of Present Illness: Called this AM as a work in due to generalized weakness. Feels like before when she was anemic last year. Despite this, was restarted on Xarelto for her atrial fibrillation which is dosed appropriately for her renal function. BM's always black due to iron, but has noted no change in their frequency or consistency in the past month.  She had EGD and Colon per Christella Hartigan in November and was okayed by him to go back on Xarelto at that time. it was thought that her bleeding was from AVM's.  She also  required outpatient transfusion in early May, receiving 2 units. Her hemoglobin was 7.2 at that time.  Review of Systems  General:  Complains of fatigue, malaise.  Eyes:  Denies blurring, diplopia.  Ears/Nose/Throat:  Denies earache, ear discharge, nosebleeds, sore throat.  Cardiovascular:  Denies chest pains, palpitations, syncope.  Respiratory:  Denies cough, dyspnea.  Gastrointestinal:  Denies nausea, vomiting, diarrhea, constipation.  Genitourinary:  Denies incontinence.  Musculoskeletal:  Denies back pain, joint pain.  Skin:  Denies rash, itching.  Neurologic:  Complains of weakness.  Psychiatric:  Denies depression, anxiety.  Endocrine:  Denies cold intolerance, heat intolerance, polydipsia, polyphagia, polyuria, weight change.  Heme/Lymphatic:  Complains of abnormal bruising.  Denies bleeding.  Allergic/Immunologic:  Denies urticaria, hay fever.  Past History  Past Medical History (reviewed - no changes required): Afib, Dr [place], Xarelto until anemia noted at [facility] 10/13  Anemia due to blood loss, eval 2013 no source found, Outpatient transfusion 01/2013  Hyperlipidemia  Old Granuloma RUL Chest CT 11/09  Cyclical cough, seen by Clance in 2011,  Chronic Kidney Disease Stage 3  COPD, mult exacerbations, 2013-14  Overactive bladder  Seasonal allergic Rhinitis  Insomnia  Benign urticaria, seen by Margo Aye (Dermatology)  Family History (reviewed - no changes required): Father died at age 38, Emphysema, HTN  Mother died at age 73, SDAT  1 Brother  Cancer-Lymphoma  1 Sister Parkinsons  Social History (reviewed - no changes required): Patient is married w/2 children.  Quit smoking in 1985, 39 pk year, does not drink alcohol.  Family History Summary:  Reviewed history Last on 01/22/2013 and no changes required:03/11/2013  Father Baker Pierini.) - Has Family History of Asthma - Entered On: 03/11/2013  Father Baker Pierini.) - Has Family History of Hypertension - Entered On: 03/11/2013   Father Baker Pierini.) - Has Family History of Lung/Respiratory Disease - Entered On: 03/11/2013  General Comments - FH:  Father died at age 64, Emphysema, HTN  Mother died at age 78, SDAT  1 Brother Cancer-Lymphoma  1 Sister Parkinsons  Social History:  Reviewed history from 06/23/2011 and no changes required:  Patient is married w/2 children.  Quit smoking in 1985, 39 pk year, does not drink alcohol.  Physical Exam  General appearance: well nourished, well hydrated, no acute distress, skin pale  Eyes  Ophthalmoscopic: discs sharp and flat, no a/v nicking, hemorrhages, or exudates  Ears, Nose and Throat  External ears: normal, no lesions or deformities  External nose: normal, no lesions or deformities  Otoscopic: canals clear, tympanic membranes intact, no fluid  Hearing: grossly intact  Nasal: mucosa, septum, and turbinates normal  Dental: good dentition  Pharynx: tongue normal, protrudes mid line, posterior pharynx without erythema or exudate  Neck  Neck: supple, no masses, trachea midline  Thyroid: no nodules, masses, tenderness, or enlargement  Respiratory  Respiratory effort: no intercostal retractions or use of accessory muscles  Auscultation: no rales, rhonchi, or wheezes  Cardiovascular  Palpation: no thrill or palpable murmurs, no displacement of PMI  Auscultation: S1, S2, no murmur, rub, or gallop  Periph. circulation: no cyanosis, clubbing, edema  Gastrointestinal  Abdomen: soft, non-tender, no masses, bowel sounds normal  Liver and spleen: no enlargement or nodularity  Lymphatic  Neck: no cervical adenopathy  Misc. lymph nodes: no other adenopathy  Musculoskeletal  Gait and station: normal  RLE: normal ROM and strength, no joint enlargement or tenderness  LLE: normal ROM and strength, no joint enlargement or tenderness  Skin  Inspection: no rashes, lesions, but slightly pale at palms  Mental Status Exam  Judgment, insight: intact  Mood and affect: Normal Mood   Impression & Recommendations:  Problem # 1: Fatigue (ICD-780.79) (ICD10-R53.83)  THis is due to anemia. Her Hgb is 5.6 I have arranged direct admission to [facility] for transfusion and will have GI see in the morning to see if they need to reconsider another scope vs. just taking her off Xarelto due to 3 bleeds in 8 months requiring transfusion. She clearly cannot tolerate anticoagulation and maintain her blood counts. Protonix per hospital formulary  Orders:  Venipuncture ([id number])  CBC/Platelets ([id number])  CMET (SMA 12) ([id number])  TSH ([id number])  TSH ([id number])  Problem # 2: Chronic obstructive pulmonary disease (ICD-496) (ICD10-J44.9)  Stable at this point, has seen Clance, using her Dulera.  Problem # 3: ANEMIA, SECONDARY TO CHRONIC BLOOD LOSS (ICD-280.0) (ICD10-D50.0)  Plan to transfuse 2 units overnight and recheck in AM as may need more, goal to get Hgb >10 given her age and comorbidities. Continue iron  Her updated medication list for this problem includes:  Ferrous Sulfate 325 (65 Fe) Mg Tabs (Ferrous sulfate) .Marland Kitchen... 1 tablet twice daily  Problem # 4: PACEMAKER, PERMANENT (ICD-V45.01) (ICD10-Z95.0)  No new issues. Followed by Cards  Problem # 5: ATRIAL FIBRILLATION, PAROXYSMAL (ICD-427.31) (ICD10-I48.0)  WIll need to stay off anticoagulation, curently paced.  Problem #  6: ENCOUNTER FOR LONG-TERM USE OF OTHER MEDICATIONS (ICD-V58.69)  Xarelto clearly causing these issues.  Problem # 7: CKD stage III (moderate) GFR 30-59 (ICD-585.3) (ICD10-N18.3)  WIll recheck at hospital as well, Xarelto was dosed appropriately. Hydration as well.  Problem # 8: INSOMNIA (ICD-780.52) (ICD10-G47.00)  Meds as below, wil use as inpatient.  Her updated medication list for this problem includes:  Temazepam 15 Mg Caps (Temazepam) .Marland Kitchen... Take one tablet once daily  Problem # 9: OVERACTIVE BLADDER (ICD-596.51) (ICD10-N32.81)  Continue her oxybutinin as per home regimen  Problem # 10: HYPERLIPIDEMIA  (ICD-272.4) (ICD10-E78.5)  WIll change to hospital preferred statin.  Her updated medication list for this problem includes:  Lovastatin 20 Mg Tabs (Lovastatin) .Marland Kitchen... Take one tablet once daily  Complete Medication List:  1) Zyrtec Allergy 10 Mg Caps (Cetirizine hcl) .... Take one capsule daily  2) Dulera 200-5 Mcg/act Aero (Mometasone furo-formoterol fum) .... As directed  3) Xarelto 15 Mg Tabs (Rivaroxaban) .... Take one tablet once daily  4) Lasix 20 Mg Tabs (Furosemide) .Marland Kitchen.. 1 tablet once daily  5) Omeprazole 20 Mg Tbec (Omeprazole) .Marland Kitchen.. 1 tablet once daily  6) Ferrous Sulfate 325 (65 Fe) Mg Tabs (Ferrous sulfate) .Marland Kitchen.. 1 tablet twice daily  7) Lovastatin 20 Mg Tabs (Lovastatin) .... Take one tablet once daily  8) Oxybutynin Chloride 5 Mg Tabs (Oxybutynin chloride) .Marland Kitchen.. 1 tablet once daily at night  9) Temazepam 15 Mg Caps (Temazepam) .... Take one tablet once daily  10) Proventil Hfa 108 (90 Base) Mcg/act Aers (Albuterol sulfate) .... 2 puffs qid as needed for cough/sob

## 2013-03-12 LAB — COMPREHENSIVE METABOLIC PANEL
AST: 18 U/L (ref 0–37)
Albumin: 3 g/dL — ABNORMAL LOW (ref 3.5–5.2)
BUN: 16 mg/dL (ref 6–23)
Calcium: 8.7 mg/dL (ref 8.4–10.5)
Creatinine, Ser: 0.97 mg/dL (ref 0.50–1.10)

## 2013-03-12 LAB — CBC
MCH: 26.6 pg (ref 26.0–34.0)
MCHC: 31 g/dL (ref 30.0–36.0)
MCV: 85.8 fL (ref 78.0–100.0)
Platelets: 260 10*3/uL (ref 150–400)
RDW: 15.1 % (ref 11.5–15.5)

## 2013-03-12 MED ORDER — ACETAMINOPHEN 325 MG PO TABS: 650.0000 mg | ORAL_TABLET | Freq: Once | ORAL | Status: DC

## 2013-03-12 NOTE — Progress Notes (Signed)
Subjective: Admitted from office yesterday afternoon.  Feels a bit tired.  Reviewed that she clearly cannot tolerate anticoagulants with her AVM's and we opt not to use them going forward.  Also discussed possibly involving GI, but she prefers to do as outpatient.  Will transfuse further today.  Objective: Vital signs in last 24 hours: Temp:  [98 F (36.7 C)-98.4 F (36.9 C)] 98 F (36.7 C) (06/17 0454) Pulse Rate:  [63-75] 75 (06/17 0454) Resp:  [16] 16 (06/17 0454) BP: (123-162)/(50-73) 162/72 mmHg (06/17 0454) SpO2:  [97 %-100 %] 97 % (06/17 0454) Weight change:     Intake/Output from previous day: 06/16 0701 - 06/17 0700 In: 1045 [P.O.:120; I.V.:300; Blood:625] Out: -  Intake/Output this shift:    General appearance: alert, cooperative and appears stated age Neck: no adenopathy, no carotid bruit, no JVD, supple, symmetrical, trachea midline and thyroid not enlarged, symmetric, no tenderness/mass/nodules Resp: clear to auscultation bilaterally Cardio: irregularly irregular rhythm GI: soft, non-tender; bowel sounds normal; no masses,  no organomegaly Extremities: extremities normal, atraumatic, no cyanosis or edema  Lab Results:  Recent Labs  03/12/13 0510  WBC 4.2  HGB 7.1*  HCT 22.9*  PLT 260   BMET  Recent Labs  03/12/13 0510  NA 132*  K 3.9  CL 100  CO2 24  GLUCOSE 85  BUN 16  CREATININE 0.97  CALCIUM 8.7    Studies/Results: No results found.  Medications:  I have reviewed the patient's current medications. Scheduled: . acetaminophen  650 mg Oral Once  . ferrous sulfate  325 mg Oral Q breakfast  . mometasone-formoterol  2 puff Inhalation BID  . multivitamin with minerals  1 tablet Oral Daily  . oxybutynin  5 mg Oral Daily  . pantoprazole  40 mg Oral Daily  . simvastatin  20 mg Oral q1800   Continuous: . 0.9 % NaCl with KCl 20 mEq / L     ZOX:[id number], acetaminophen, albuterol, HYDROcodone-acetaminophen, ondansetron (ZOFRAN) IV,  ondansetron, temazepam  Assessment/Plan: Anemia requiring transfusion, symptomatic.  Had workup in late fall of last year revealing only AVM's.  She does not want to undergo more procedures at this time.  WIll give another 3 units today to get counts about Hgb 10 and send home afterwards with f/u in the office early next week.  PPI indefinitely, no further Xarelto  COPD No new issues.  CKD 3  Stable  Afib  Paced, rate control, accepts higher risk of clot and stroke going forward as recurrent need for transfusion (twince in 6 weeks, 1 -current- requiring hospitalization) is clearly a contraindication.  Plan is 3 more transfused units and home at end of day (admittee as observation)   LOS: 1 day   Brande Uncapher W 03/12/2013, 8:00 AM

## 2013-03-12 NOTE — Discharge Summary (Signed)
DISCHARGE SUMMARY  Mikayla Harper  MR#: [id number]  DOB:18-Sep-1930  Date of Admission: 03/11/2013 Date of Discharge: 03/12/2013  Attending Physician:Lanecia Sliva W  Patient's JYN:WGNFAOZ,HYQMVHQ W, MD  Consults:None  Discharge Diagnoses: Anemia requiring transfusion, due to presumed AVM bleeding and anticoagulation. Atrial fibrillation on Xarelto (discontined at this visit) Pacemaker, prior HTN Hyperlipidemia Insomnia Chronic kidney disease Stage 3 Overactive bladder OA knees COPD  Discharge Medications:   Medication List    STOP taking these medications       HYDROcodone-homatropine 5-1.5 MG/5ML syrup  Commonly known as:  HYCODAN     Rivaroxaban 15 MG Tabs tablet  Commonly known as:  XARELTO      TAKE these medications       albuterol 108 (90 BASE) MCG/ACT inhaler  Commonly known as:  PROVENTIL HFA;VENTOLIN HFA  Inhale 2 puffs into the lungs every 6 (six) hours as needed for wheezing.     cetirizine 10 MG tablet  Commonly known as:  ZYRTEC  Take 10 mg by mouth daily as needed for allergies.     diltiazem 120 MG 24 hr capsule  Commonly known as:  CARDIZEM CD  Take 120 mg by mouth daily.     ferrous sulfate 325 (65 FE) MG tablet  Take 325 mg by mouth daily with breakfast.     lovastatin 20 MG tablet  Commonly known as:  MEVACOR  Take 20 mg by mouth at bedtime.     mometasone-formoterol 100-5 MCG/ACT Aero  Commonly known as:  DULERA  Inhale 2 puffs into the lungs 2 (two) times daily.     omeprazole 40 MG capsule  Commonly known as:  PRILOSEC  Take 40 mg by mouth 2 (two) times daily.     oxybutynin 5 MG tablet  Commonly known as:  DITROPAN  Take 5 mg by mouth daily.     propafenone 225 MG tablet  Commonly known as:  RYTHMOL  Take 225 mg by mouth every 8 (eight) hours.     temazepam 15 MG capsule  Commonly known as:  RESTORIL  Take 15 mg by mouth at bedtime as needed for sleep.     traMADol 50 MG tablet  Commonly known as:  ULTRAM  Take 50  mg by mouth every 6 (six) hours as needed for pain.     WOMENS MULTIVITAMIN PLUS PO  Take 1 tablet by mouth daily.       Hospital Procedures: No results found.  History of Present Illness: Long term anticoagulation with anemia in November with GI workup showing AVM's.  Back on anticoagulation, received outpatient transfusion early May and was worked in to my schedule yesterday due to weakness and labs subsequently showing a Hgb of 5.6  Hospital Course: Mikayla Harper was admitted, taken off her Xarelto and maintained on her home meds.  Received 2 units of blood overnight on the 16th.  Hgb only in to low 7's by AM.  Another 3 units transfused during the day on June 17th without incident and patient was sent home.  WE had discussed possible GI involvement, but she did not wish to undergo further procedures as she had had EGD and Colonooscopy both in November of 2013.  Day of Discharge Exam BP 156/72  Pulse 62  Temp(Src) 97.9 F (36.6 C) (Oral)  Resp 14  SpO2 97%  Physical Exam: General appearance: alert, cooperative and appears stated age  Neck: no adenopathy, no carotid bruit, no JVD, supple, symmetrical, trachea midline and thyroid not enlarged, symmetric, no  tenderness/mass/nodules  Resp: clear to auscultation bilaterally  Cardio: irregularly irregular rhythm  GI: soft, non-tender; bowel sounds normal; no masses, no organomegaly  Extremities: extremities normal, atraumatic, no cyanosis or edema  Discharge Labs:  Recent Labs  03/12/13 0510  NA 132*  K 3.9  CL 100  CO2 24  GLUCOSE 85  BUN 16  CREATININE 0.97  CALCIUM 8.7    Recent Labs  03/12/13 0510  AST 18  ALT 10  ALKPHOS 62  BILITOT 0.6  PROT 5.4*  ALBUMIN 3.0*    Recent Labs  03/12/13 0510  WBC 4.2  HGB 7.1*  HCT 22.9*  MCV 85.8  PLT 260   Lab Results  Component Value Date   INR 0.9 10/20/2011   Discharge instructions:      Future Appointments Provider Department Dept Phone   05/06/2013 9:00 AM  [facility] [place] [employer] Main Office [place]) [phone]      Disposition: Home Follow-up Appts: Follow-up with Dr. Wylene Simmer at [facility] next week.  Shanon (nurse) will contact Malachi Bonds tomorrow to arrange. Condition on Discharge: Stable Tests Needing Follow-up:  CBC at next OV in 1 week.   Signed: Gaspar Garbe 03/12/2013, 3:56 PM

## 2013-03-13 LAB — TYPE AND SCREEN
ABO/RH(D): O POS
Antibody Screen: NEGATIVE
Unit division: 0
Unit division: 0
Unit division: 0

## 2013-03-25 ENCOUNTER — Telehealth: Payer: Self-pay | Admitting: Cardiology

## 2013-03-25 NOTE — Telephone Encounter (Signed)
New PRoblem:    Patient called in because she is uncomfortable not taking her [place]amp because she is afraid she is going to have a stroke.  Please call back.

## 2013-03-25 NOTE — Telephone Encounter (Signed)
Returned call to patient Dr.Jordan advised to stay off xarelto until cleared by Dr.Tisovec.

## 2013-03-25 NOTE — Telephone Encounter (Signed)
Returned call to patient she stated she was told at hospital not to take xarelto.Stated she was worried she might have a stroke.Stated she wanted me to ask Dr.Jordan can she go back on xarelto or can she take asa or coumadin.Message sent to Dr.Jordan for advice.

## 2013-03-25 NOTE — Telephone Encounter (Signed)
She was admitted with a GI bleed with Hgb down to 5.6. She needs to stay off Xarelto until cleared by Dr. Wylene Simmer.  Star Cheese [place] MD, [facility]

## 2013-05-01 ENCOUNTER — Telehealth: Payer: Self-pay

## 2013-05-01 DIAGNOSIS — D649 Anemia, unspecified: Secondary | ICD-10-CM

## 2013-05-01 NOTE — Telephone Encounter (Signed)
Pt has been notified and will have labs this week 

## 2013-05-01 NOTE — Telephone Encounter (Signed)
Message copied by Donata Duff on Wed May 01, 2013  8:17 AM ------      Message from: Donata Duff      Created: Thu Nov 01, 2012  2:11 PM       Pt needs CBC NOT in EPIC ------

## 2013-05-02 ENCOUNTER — Other Ambulatory Visit (INDEPENDENT_AMBULATORY_CARE_PROVIDER_SITE_OTHER): Payer: Medicare Other

## 2013-05-02 DIAGNOSIS — D509 Iron deficiency anemia, unspecified: Secondary | ICD-10-CM

## 2013-05-02 LAB — CBC WITH DIFFERENTIAL/PLATELET
Basophils Absolute: 0 10*3/uL (ref 0.0–0.1)
Eosinophils Absolute: 0.3 10*3/uL (ref 0.0–0.7)
Eosinophils Relative: 6.3 % — ABNORMAL HIGH (ref 0.0–5.0)
HCT: 32 % — ABNORMAL LOW (ref 36.0–46.0)
Lymphs Abs: 1 10*3/uL (ref 0.7–4.0)
MCV: 91.8 fl (ref 78.0–100.0)
Monocytes Absolute: 0.7 10*3/uL (ref 0.1–1.0)
Neutrophils Relative %: 61.9 % (ref 43.0–77.0)
Platelets: 268 10*3/uL (ref 150.0–400.0)
RDW: 20 % — ABNORMAL HIGH (ref 11.5–14.6)
WBC: 5.5 10*3/uL (ref 4.5–10.5)

## 2013-05-06 ENCOUNTER — Ambulatory Visit (INDEPENDENT_AMBULATORY_CARE_PROVIDER_SITE_OTHER): Payer: Medicare Other | Admitting: *Deleted

## 2013-05-06 DIAGNOSIS — I495 Sick sinus syndrome: Secondary | ICD-10-CM

## 2013-05-06 LAB — PACEMAKER DEVICE OBSERVATION
AL AMPLITUDE: 3.6 mv
AL IMPEDENCE PM: 350 Ohm
AL THRESHOLD: 0.75 V
ATRIAL PACING PM: 44
DEVICE MODEL PM: 7300824
RV LEAD AMPLITUDE: 5 mv
RV LEAD IMPEDENCE PM: 387.5 Ohm
RV LEAD THRESHOLD: 1.875 V

## 2013-05-06 NOTE — Progress Notes (Signed)
PPM check in office. 

## 2013-07-02 ENCOUNTER — Encounter: Payer: Self-pay | Admitting: Internal Medicine

## 2013-10-12 ENCOUNTER — Other Ambulatory Visit: Payer: Self-pay | Admitting: Internal Medicine

## 2013-10-15 ENCOUNTER — Other Ambulatory Visit: Payer: Self-pay | Admitting: Internal Medicine

## 2013-11-07 ENCOUNTER — Encounter: Payer: Self-pay | Admitting: Internal Medicine

## 2013-11-07 ENCOUNTER — Ambulatory Visit (INDEPENDENT_AMBULATORY_CARE_PROVIDER_SITE_OTHER): Payer: Medicare Other | Admitting: Internal Medicine

## 2013-11-07 VITALS — BP 139/79 | HR 60 | Ht 63.0 in | Wt 167.1 lb

## 2013-11-07 DIAGNOSIS — I480 Paroxysmal atrial fibrillation: Secondary | ICD-10-CM

## 2013-11-07 DIAGNOSIS — I4891 Unspecified atrial fibrillation: Secondary | ICD-10-CM

## 2013-11-07 DIAGNOSIS — Z950 Presence of cardiac pacemaker: Secondary | ICD-10-CM

## 2013-11-07 DIAGNOSIS — I495 Sick sinus syndrome: Secondary | ICD-10-CM

## 2013-11-07 LAB — MDC_IDC_ENUM_SESS_TYPE_INCLINIC
Battery Remaining Longevity: 93.6 mo
Battery Voltage: 2.98 V
Implantable Pulse Generator Model: 2110
Implantable Pulse Generator Serial Number: 7300824
Lead Channel Pacing Threshold Amplitude: 0.75 V
Lead Channel Pacing Threshold Amplitude: 1.5 V
Lead Channel Pacing Threshold Pulse Width: 0.8 ms
Lead Channel Sensing Intrinsic Amplitude: 5.4 mV
Lead Channel Setting Pacing Amplitude: 3 V
Lead Channel Setting Pacing Pulse Width: 0.8 ms
Lead Channel Setting Sensing Sensitivity: 2 mV
MDC IDC MSMT LEADCHNL RA IMPEDANCE VALUE: 350 Ohm
MDC IDC MSMT LEADCHNL RA PACING THRESHOLD PULSEWIDTH: 0.5 ms
MDC IDC MSMT LEADCHNL RA SENSING INTR AMPL: 3.1 mV
MDC IDC MSMT LEADCHNL RV IMPEDANCE VALUE: 362.5 Ohm
MDC IDC SESS DTM: 20150212094536
MDC IDC SET LEADCHNL RA PACING AMPLITUDE: 1.75 V
MDC IDC STAT BRADY RA PERCENT PACED: 75 %
MDC IDC STAT BRADY RV PERCENT PACED: 15 %

## 2013-11-07 NOTE — Patient Instructions (Signed)
Your physician has recommended you make the following change in your medication:  1) Stop Aspirin  Your physician recommends that you schedule a follow-up appointment in: 6 months with device clinic.   Your physician wants you to follow-up in: 1 year with [employer], PA-C.  You will receive a reminder letter in the mail two months in advance. If you don't receive a letter, please call our office to schedule the follow-up appointment.

## 2013-11-07 NOTE — Assessment & Plan Note (Addendum)
The patient's device was interrogated.  The information was reviewed. No changes were made in the programming.   The patient's device was interrogated.  The information was reviewed. No changes were made in the programming.   There has been no intercurrent atrial fibrillation ]

## 2013-11-07 NOTE — Assessment & Plan Note (Signed)
The patient has paroxysmal atrial fibrillation and a CHADS-VASc score of 5. This is associated with a normalized risk of 8-10% and not significantly attenuated by the aspirin. Studies from [place] have  suggested that aspirin is even more risky than nothing so I have asked her to stop her aspirin.  I will further followup with her PCP and Dr. Ardis Hughs to discuss if there is any way at all that she can take anticoagulant like apixaban which in the [place] trial bled no more than aspirin.

## 2013-11-07 NOTE — Progress Notes (Signed)
Patient Care Team: Haywood Pao, MD as PCP - General   HPI  Mikayla Harper is a 80 y.o. female  Seen in followup for a pacemaker implanted January 2013 for tachybradycardia syndrome with paroxysmal atrial fibrillation as well as prolonged pauses;  she is now back on Rivaroxaban   She has been on chronic propafenone. She says she has not had recurrence since 2003.     She has had what was felt to be a recent TIA.  Echocardiogram 1/13 demonstrated normal left ventricular function and normal left atrial size and normal left atrial size there was diastolic dysfunction  Thromboembolic risk factors are notable for gender and age and prior TIA. She does has a CHADS-VASc score of 5;   She has had intercurrent problems with GI bleeding attributed to AVMs i and her Rivaroxaban  was discontinued And aspirin 325 was put in its place.  Past Medical History  Diagnosis Date  . PAF (paroxysmal atrial fibrillation)     previously on propafenone  . Carpal tunnel syndrome of left wrist 12/20/2010    left hand  . Influenza-like illness 2009    H1N1 PCR negative  . Urinary tract infection   . Hyperlipidemia     on Mevacor  . Osteopenia   . TIA (transient ischemic attack) Dec 2012  . Tuberculosis      hx of over 50 years ago "  . Sick sinus syndrome   . Pacemaker - [facility]     Implant Jan 2013  . Anemia     Past Surgical History  Procedure Laterality Date  . Back surgery  1976  . Other surgical history  In the late 1960's    Hysterectomy secondary to fibroids  . Other surgical history      cataract surgery  . Abdominal hysterectomy    . Insert / replace / remove pacemaker  10/21/2011  . Esophagogastroduodenoscopy  08/01/2012    Procedure: ESOPHAGOGASTRODUODENOSCOPY (EGD);  Surgeon: Milus Banister, MD;  Location: [facility];  Service: Endoscopy;  Laterality: N/A;  . Colonoscopy  08/01/2012    Procedure: COLONOSCOPY;  Surgeon: Milus Banister, MD;  Location: [facility] ENDOSCOPY;   Service: Endoscopy;  Laterality: N/A;  . Cardioversion N/A 11/13/2012    Procedure: CARDIOVERSION;  Surgeon: Peter M Martinique, MD;  Location: [facility] ENDOSCOPY;  Service: Cardiovascular;  Laterality: N/A;    Current Outpatient Prescriptions  Medication Sig Dispense Refill  . albuterol (PROVENTIL HFA;VENTOLIN HFA) 108 (90 BASE) MCG/ACT inhaler Inhale 2 puffs into the lungs every 6 (six) hours as needed for wheezing.      Marland Kitchen aspirin 325 MG tablet Take 325 mg by mouth daily.      . cetirizine (ZYRTEC) 10 MG tablet Take 10 mg by mouth daily as needed for allergies.      Marland Kitchen diltiazem (CARDIZEM CD) 120 MG 24 hr capsule TAKE 1 CAPSULE (120 MG TOTAL) BY MOUTH DAILY.  90 capsule  3  . ferrous sulfate 325 (65 FE) MG tablet Take 325 mg by mouth daily with breakfast.      . lovastatin (MEVACOR) 20 MG tablet Take 20 mg by mouth at bedtime.      . mometasone-formoterol (DULERA) 100-5 MCG/ACT AERO Inhale 2 puffs into the lungs as needed.       . Multiple Vitamins-Minerals (WOMENS MULTIVITAMIN PLUS PO) Take 1 tablet by mouth daily.      Marland Kitchen oxybutynin (DITROPAN) 5 MG tablet Take 5 mg by mouth  daily.       . propafenone (RYTHMOL) 225 MG tablet Take 225 mg by mouth every 8 (eight) hours.      . temazepam (RESTORIL) 15 MG capsule Take 15 mg by mouth at bedtime as needed for sleep.      . traMADol (ULTRAM) 50 MG tablet Take 50 mg by mouth every 6 (six) hours as needed for pain.      Marland Kitchen ZOSTAVAX 81017 UNT/0.65ML injection        No current facility-administered medications for this visit.    Allergies  Allergen Reactions  . Lisinopril Other (See Comments)    Dizzy and lightheadedness    Review of Systems negative except from HPI and PMH  Physical Exam BP 139/79  Pulse 60  Ht 5\' 3"  (1.6 m)  Wt 167 lb 1.9 oz (75.805 kg)  BMI 29.61 kg/m2 Well developed and well nourished in no acute distress HENT normal E scleral and icterus clear Neck Supple JVP flat; carotids brisk and full Clear to ausculation Device  pocket well healed; without hematoma or erythema.  There is no tetheringRegular rate and rhythm, no murmurs gallops or rub Soft with active bowel sounds No clubbing cyanosis none Edema Alert and oriented, grossly normal motor and sensory function Skin Warm and Dry  ECG demonstrates atrial pacing at 60 Interval 20/08/44  Assessment and  Plan

## 2013-12-10 ENCOUNTER — Other Ambulatory Visit: Payer: Self-pay

## 2013-12-10 MED ORDER — PROPAFENONE HCL 225 MG PO TABS: 225.0000 mg | ORAL_TABLET | Freq: Three times a day (TID) | ORAL | Status: DC

## 2013-12-12 ENCOUNTER — Other Ambulatory Visit: Payer: Self-pay | Admitting: *Deleted

## 2013-12-12 MED ORDER — PROPAFENONE HCL 225 MG PO TABS: 225.0000 mg | ORAL_TABLET | Freq: Three times a day (TID) | ORAL | Status: DC

## 2013-12-19 ENCOUNTER — Other Ambulatory Visit: Payer: Self-pay

## 2013-12-19 MED ORDER — DILTIAZEM HCL ER COATED BEADS 120 MG PO CP24: ORAL_CAPSULE | ORAL | Status: DC

## 2014-02-23 IMAGING — CR DG CHEST 2V
2 series · 2 of 2 positions shown · non-contrast
Comparison: 08/09/2008 and 10/22/2011

CLINICAL DATA: Cough, COPD

CHEST - 2 VIEW

[view not recorded (1 of 2)]
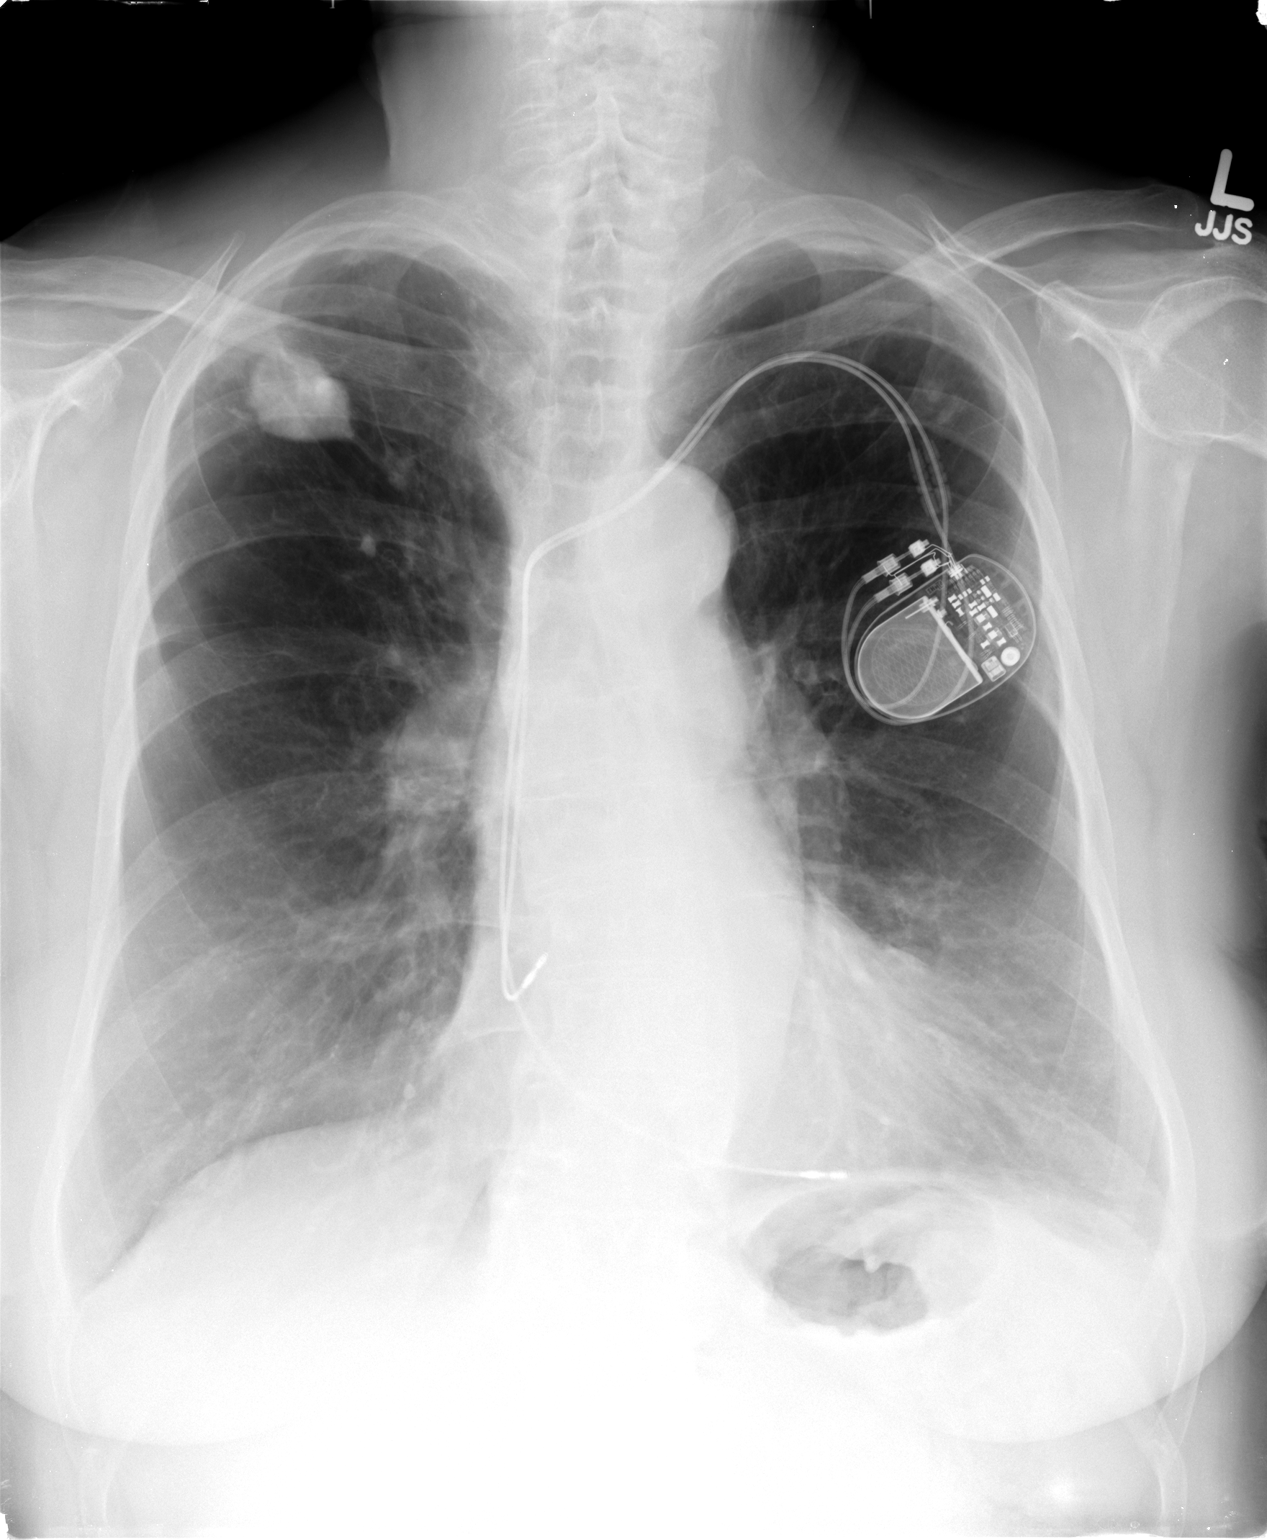

[view not recorded (2 of 2)]
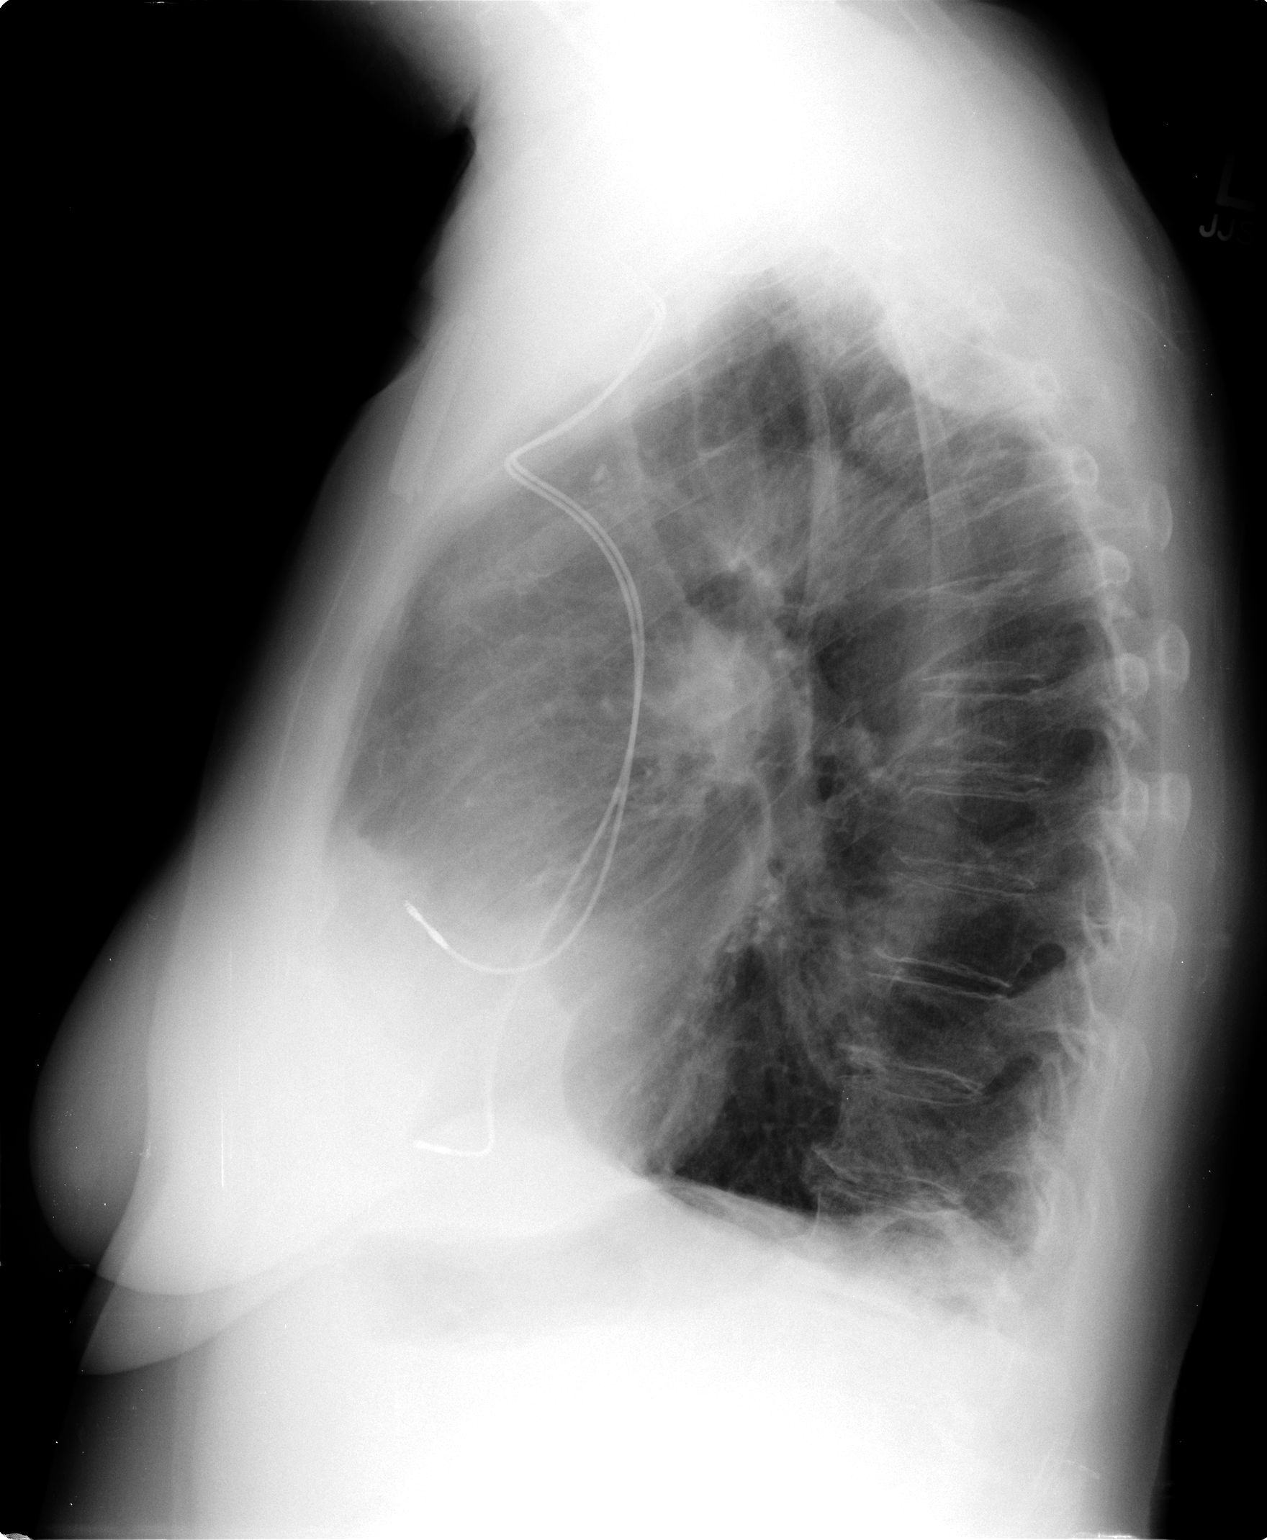

[2 of 2 positions shown; findings below may reference images not displayed]

FINDINGS: Cardiomediastinal silhouette is stable.  Dual lead
cardiac pacemaker is unchanged in position.  No acute infiltrate or
pleural effusion.  No pulmonary edema.  Hyperinflation again noted.
Large calcified granuloma in the right upper lobe is stable.
Additional smaller calcified granulomas are stable bilaterally.

Mild degenerative changes thoracic spine again noted.
IMPRESSION: No active disease.  Stable hyperinflation and chronic granulomatous
changes.

## 2014-05-08 ENCOUNTER — Ambulatory Visit (INDEPENDENT_AMBULATORY_CARE_PROVIDER_SITE_OTHER): Payer: Medicare Other | Admitting: *Deleted

## 2014-05-08 DIAGNOSIS — I495 Sick sinus syndrome: Secondary | ICD-10-CM

## 2014-05-08 DIAGNOSIS — Z950 Presence of cardiac pacemaker: Secondary | ICD-10-CM

## 2014-05-09 LAB — MDC_IDC_ENUM_SESS_TYPE_INCLINIC
Battery Remaining Longevity: 103.2 mo
Brady Statistic RV Percent Paced: 17 %
Implantable Pulse Generator Model: 2110
Implantable Pulse Generator Serial Number: 7300824
Lead Channel Impedance Value: 400 Ohm
Lead Channel Pacing Threshold Amplitude: 0.75 V
Lead Channel Pacing Threshold Amplitude: 1.5 V
Lead Channel Pacing Threshold Amplitude: 1.5 V
Lead Channel Pacing Threshold Pulse Width: 0.8 ms
Lead Channel Pacing Threshold Pulse Width: 0.8 ms
Lead Channel Sensing Intrinsic Amplitude: 3.3 mV
Lead Channel Sensing Intrinsic Amplitude: 6.8 mV
Lead Channel Setting Pacing Amplitude: 1.75 V
MDC IDC MSMT BATTERY VOLTAGE: 2.96 V
MDC IDC MSMT LEADCHNL RA IMPEDANCE VALUE: 350 Ohm
MDC IDC MSMT LEADCHNL RA PACING THRESHOLD PULSEWIDTH: 0.5 ms
MDC IDC SESS DTM: 20150813123925
MDC IDC SET LEADCHNL RV PACING AMPLITUDE: 3 V
MDC IDC SET LEADCHNL RV PACING PULSEWIDTH: 0.8 ms
MDC IDC SET LEADCHNL RV SENSING SENSITIVITY: 2 mV
MDC IDC STAT BRADY RA PERCENT PACED: 80 %

## 2014-05-09 NOTE — Progress Notes (Signed)
Pacemaker check in clinic. Normal device function. Thresholds, sensing, impedances consistent with previous measurements. Device programmed to maximize longevity. 4 mode switches--- <1%, longest 14sec. No high ventricular rates noted. Device programmed at appropriate safety margins. Histogram distribution appropriate for patient activity level. Device programmed to optimize intrinsic conduction. Estimated longevity 5.4-8.55yrs. Pt cannot hear alert tone. ROV w/ Dr. Caryl Comes in 37mo.

## 2014-05-21 ENCOUNTER — Encounter: Payer: Self-pay | Admitting: Internal Medicine

## 2014-09-04 ENCOUNTER — Encounter (HOSPITAL_COMMUNITY): Payer: Self-pay | Admitting: Internal Medicine

## 2014-10-31 ENCOUNTER — Other Ambulatory Visit: Payer: Self-pay | Admitting: Cardiology

## 2014-11-03 ENCOUNTER — Other Ambulatory Visit: Payer: Self-pay | Admitting: *Deleted

## 2014-11-03 ENCOUNTER — Telehealth: Payer: Self-pay | Admitting: Cardiology

## 2014-11-03 NOTE — Telephone Encounter (Signed)
Pt need a new prescription for Propafenone #90 and refills. Please call to [facility].

## 2014-11-14 ENCOUNTER — Ambulatory Visit (INDEPENDENT_AMBULATORY_CARE_PROVIDER_SITE_OTHER): Payer: Medicare Other | Admitting: Internal Medicine

## 2014-11-14 ENCOUNTER — Encounter: Payer: Self-pay | Admitting: Internal Medicine

## 2014-11-14 VITALS — BP 122/70 | HR 60 | Ht 64.0 in | Wt 164.8 lb

## 2014-11-14 DIAGNOSIS — I495 Sick sinus syndrome: Secondary | ICD-10-CM

## 2014-11-14 DIAGNOSIS — I480 Paroxysmal atrial fibrillation: Secondary | ICD-10-CM

## 2014-11-14 DIAGNOSIS — Z45018 Encounter for adjustment and management of other part of cardiac pacemaker: Secondary | ICD-10-CM

## 2014-11-14 LAB — MDC_IDC_ENUM_SESS_TYPE_INCLINIC
Battery Remaining Longevity: 99.6 mo
Brady Statistic RV Percent Paced: 32 %
Implantable Pulse Generator Model: 2110
Lead Channel Impedance Value: 400 Ohm
Lead Channel Pacing Threshold Amplitude: 1.5 V
Lead Channel Sensing Intrinsic Amplitude: 8.3 mV
Lead Channel Setting Pacing Amplitude: 1.875
Lead Channel Setting Pacing Pulse Width: 0.8 ms
MDC IDC MSMT BATTERY VOLTAGE: 2.95 V
MDC IDC MSMT LEADCHNL RA IMPEDANCE VALUE: 350 Ohm
MDC IDC MSMT LEADCHNL RA PACING THRESHOLD AMPLITUDE: 0.875 V
MDC IDC MSMT LEADCHNL RA PACING THRESHOLD PULSEWIDTH: 0.5 ms
MDC IDC MSMT LEADCHNL RA SENSING INTR AMPL: 3.4 mV
MDC IDC MSMT LEADCHNL RV PACING THRESHOLD PULSEWIDTH: 1 ms
MDC IDC PG SERIAL: 7300824
MDC IDC SESS DTM: 20160219105307
MDC IDC SET LEADCHNL RV PACING AMPLITUDE: 2.5 V
MDC IDC SET LEADCHNL RV SENSING SENSITIVITY: 2 mV
MDC IDC STAT BRADY RA PERCENT PACED: 89 %

## 2014-11-14 NOTE — Patient Instructions (Signed)
Your physician has recommended you make the following change in your medication:  1) DECREASE Aspirin to 81 mg daily  Your physician wants you to follow-up in: 6 months with device clinic.  You will receive a reminder letter in the mail two months in advance. If you don't receive a letter, please call our office to schedule the follow-up appointment.  Your physician wants you to follow-up in: 1 year with Dr. Caryl Comes.  You will receive a reminder letter in the mail two months in advance. If you don't receive a letter, please call our office to schedule the follow-up appointment.

## 2014-11-14 NOTE — Progress Notes (Signed)
Patient Care Team: Haywood Pao, MD as PCP - General   HPI  Mikayla Harper is a 81 y.o. female  Seen in followup for a pacemaker implanted January 2013 for tachybradycardia syndrome with paroxysmal atrial fibrillation as well as prolonged pauses;   She has had intercurrent problems with GI bleeding attributed to AVMs i and her Rivaroxaban  was discontinued And aspirin 325 was put in its place.   She has been on chronic propafenone. She says she has not had recurrence since 2003.    She has had what was felt to be a recent TIA.  Echocardiogram 1/13 demonstrated normal left ventricular function and normal left atrial size and normal left atrial size there was diastolic dysfunction  Thromboembolic risk factors are notable for gender and age and prior TIA. She does has a CHADS-VASc score of 5;   Propafenone QRS d  2/15  88  2/16 106    Past Medical History  Diagnosis Date  . PAF (paroxysmal atrial fibrillation)     previously on propafenone  . Carpal tunnel syndrome of left wrist 12/20/2010    left hand  . Influenza-like illness 2009    H1N1 PCR negative  . Urinary tract infection   . Hyperlipidemia     on Mevacor  . Osteopenia   . TIA (transient ischemic attack) Dec 2012  . Tuberculosis      hx of over 50 years ago "  . Sick sinus syndrome   . Pacemaker - [facility]     Implant Jan 2013  . Anemia     Past Surgical History  Procedure Laterality Date  . Back surgery  1976  . Other surgical history  In the late 1960's    Hysterectomy secondary to fibroids  . Other surgical history      cataract surgery  . Abdominal hysterectomy    . Insert / replace / remove pacemaker  10/21/2011  . Esophagogastroduodenoscopy  08/01/2012    Procedure: ESOPHAGOGASTRODUODENOSCOPY (EGD);  Surgeon: Milus Banister, MD;  Location: [facility] ENDOSCOPY;  Service: Endoscopy;  Laterality: N/A;  . Colonoscopy  08/01/2012    Procedure: COLONOSCOPY;  Surgeon: Milus Banister, MD;  Location:  [facility] ENDOSCOPY;  Service: Endoscopy;  Laterality: N/A;  . Cardioversion N/A 11/13/2012    Procedure: CARDIOVERSION;  Surgeon: Peter M Martinique, MD;  Location: [facility] ENDOSCOPY;  Service: Cardiovascular;  Laterality: N/A;  . Permanent pacemaker insertion N/A 10/21/2011    Procedure: PERMANENT PACEMAKER INSERTION;  Surgeon: Deboraha Sprang, MD;  Location: [facility] CATH LAB;  Service: Cardiovascular;  Laterality: N/A;    Current Outpatient Prescriptions  Medication Sig Dispense Refill  . albuterol (PROVENTIL HFA;VENTOLIN HFA) 108 (90 BASE) MCG/ACT inhaler Inhale 2 puffs into the lungs every 6 (six) hours as needed for wheezing.    Marland Kitchen aspirin 325 MG tablet Take 325 mg by mouth daily.    . cetirizine (ZYRTEC) 10 MG tablet Take 10 mg by mouth daily as needed for allergies. Takes only in spring/summer    . diltiazem (CARDIZEM CD) 120 MG 24 hr capsule TAKE 1 CAPSULE (120 MG TOTAL) BY MOUTH DAILY. 90 capsule 1  . ferrous sulfate 325 (65 FE) MG tablet Take 325 mg by mouth daily with breakfast.    . lovastatin (MEVACOR) 20 MG tablet Take 20 mg by mouth at bedtime.    . mometasone-formoterol (DULERA) 100-5 MCG/ACT AERO Inhale 2 puffs into the lungs as needed for wheezing or shortness of breath.     Marland Kitchen  Multiple Vitamins-Minerals (WOMENS MULTIVITAMIN PLUS PO) Take 1 tablet by mouth daily.    Marland Kitchen oxybutynin (DITROPAN) 5 MG tablet Take 5 mg by mouth daily.     . propafenone (RYTHMOL) 225 MG tablet TAKE 1 BY MOUTH EVERY 8 HOURS 270 tablet 0  . temazepam (RESTORIL) 15 MG capsule Take 15 mg by mouth at bedtime as needed for sleep.    . traMADol (ULTRAM) 50 MG tablet Take 50 mg by mouth every 6 (six) hours as needed for pain.    Marland Kitchen ZOSTAVAX 10258 UNT/0.65ML injection      No current facility-administered medications for this visit.    Allergies  Allergen Reactions  . Lisinopril Other (See Comments)    Dizzy and lightheadedness    Review of Systems negative except from HPI and PMH  Physical Exam BP 122/70 mmHg  Pulse 60   Ht 5\' 4"  (1.626 m)  Wt 164 lb 12.8 oz (74.753 kg)  BMI 28.27 kg/m2 Well developed and well nourished in no acute distress HENT normal E scleral and icterus clear Neck Supple JVP flat; carotids brisk and full Clear to ausculation Device pocket well healed; without hematoma or erythema.  There is no tetheringRegular rate and rhythm, no murmurs gallops or rub Soft with active bowel sounds No clubbing cyanosis none Edema Alert and oriented, grossly normal motor and sensory function Skin Warm and Dry  ECG demonstrates atrial pacing at 60 Interval 20/11/44  Assessment and  Plan  Atrial fibrillation-paroxysmal  Pacemaker-St. Jude  Sinus node dysfunction The patient's device was interrogated.  The information was reviewed. No changes were made in the programming.     QRS widening  Will decrease ASA  325>>81  I would favor the use of a NOAC as we have discussed before. I will send his back over to Dr. Ardis Hughs.  She remains on propafenone. There is mild prolongation of the QRS duration. Continue it monitoring QRSd

## 2014-11-25 ENCOUNTER — Other Ambulatory Visit: Payer: Self-pay | Admitting: Cardiology

## 2014-11-25 ENCOUNTER — Other Ambulatory Visit: Payer: Self-pay | Admitting: Internal Medicine

## 2014-11-25 NOTE — Telephone Encounter (Signed)
Rx refill sent to patient pharmacy   

## 2015-06-02 ENCOUNTER — Encounter: Payer: Self-pay | Admitting: *Deleted

## 2015-06-08 ENCOUNTER — Other Ambulatory Visit: Payer: Self-pay

## 2015-06-08 NOTE — Telephone Encounter (Signed)
Approved      Disp Refills Start End    propafenone (RYTHMOL) 225 MG tablet 270 tablet 2 11/26/2014     Sig:  TAKE 1 BY MOUTH EVERY 8 HOURS    Class:  Normal    DAW:  No    Authorizing Provider:  Deboraha Sprang, MD    Ordering User:  Juventino Slovak, CMA

## 2015-06-15 ENCOUNTER — Ambulatory Visit (INDEPENDENT_AMBULATORY_CARE_PROVIDER_SITE_OTHER): Payer: Medicare Other | Admitting: *Deleted

## 2015-06-15 DIAGNOSIS — I495 Sick sinus syndrome: Secondary | ICD-10-CM

## 2015-06-15 DIAGNOSIS — I480 Paroxysmal atrial fibrillation: Secondary | ICD-10-CM

## 2015-06-17 LAB — CUP PACEART INCLINIC DEVICE CHECK
Battery Remaining Longevity: 112.8 mo
Battery Voltage: 2.95 V
Brady Statistic RV Percent Paced: 1.7 %
Lead Channel Impedance Value: 400 Ohm
Lead Channel Pacing Threshold Amplitude: 0.875 V
Lead Channel Sensing Intrinsic Amplitude: 2.9 mV
Lead Channel Sensing Intrinsic Amplitude: 7.4 mV
Lead Channel Setting Pacing Pulse Width: 0.8 ms
MDC IDC MSMT LEADCHNL RA IMPEDANCE VALUE: 375 Ohm
MDC IDC MSMT LEADCHNL RA PACING THRESHOLD PULSEWIDTH: 0.5 ms
MDC IDC MSMT LEADCHNL RV PACING THRESHOLD AMPLITUDE: 1.5 V
MDC IDC MSMT LEADCHNL RV PACING THRESHOLD AMPLITUDE: 1.5 V
MDC IDC MSMT LEADCHNL RV PACING THRESHOLD PULSEWIDTH: 0.8 ms
MDC IDC MSMT LEADCHNL RV PACING THRESHOLD PULSEWIDTH: 0.8 ms
MDC IDC PG SERIAL: 7300824
MDC IDC SESS DTM: 20160919191151
MDC IDC SET LEADCHNL RA PACING AMPLITUDE: 1.875
MDC IDC SET LEADCHNL RV PACING AMPLITUDE: 3 V
MDC IDC SET LEADCHNL RV SENSING SENSITIVITY: 2 mV
MDC IDC STAT BRADY RA PERCENT PACED: 89 %

## 2015-06-17 NOTE — Progress Notes (Signed)
Pacemaker check in clinic. Normal device function. Thresholds, sensing, impedances consistent with previous measurements. Device programmed to maximize longevity. (6) mode switches (<1%)---max dur. 1 hr 21 mins, Max A 265, Max V 123 + Propafenone/ASA. No high ventricular rates noted. Device programmed at appropriate safety margins. Histogram distribution appropriate for patient activity level. Device programmed to optimize intrinsic conduction. Estimated longevity 7.3-9.6 years. Patient will follow up with SK in 10-2015.

## 2015-07-16 ENCOUNTER — Encounter: Payer: Self-pay | Admitting: Internal Medicine

## 2015-08-03 ENCOUNTER — Other Ambulatory Visit: Payer: Self-pay | Admitting: Internal Medicine

## 2015-08-24 ENCOUNTER — Other Ambulatory Visit: Payer: Self-pay | Admitting: *Deleted

## 2015-08-24 MED ORDER — PROPAFENONE HCL 225 MG PO TABS: ORAL_TABLET | ORAL | Status: DC

## 2015-10-06 ENCOUNTER — Other Ambulatory Visit: Payer: Self-pay | Admitting: *Deleted

## 2015-10-06 MED ORDER — PROPAFENONE HCL 225 MG PO TABS: ORAL_TABLET | ORAL | Status: DC

## 2015-10-07 ENCOUNTER — Other Ambulatory Visit: Payer: Self-pay | Admitting: *Deleted

## 2015-10-07 MED ORDER — DILTIAZEM HCL ER COATED BEADS 120 MG PO CP24: 120.0000 mg | ORAL_CAPSULE | Freq: Every day | ORAL | Status: DC

## 2015-10-26 DIAGNOSIS — N390 Urinary tract infection, site not specified: Secondary | ICD-10-CM | POA: Diagnosis not present

## 2015-10-26 DIAGNOSIS — R350 Frequency of micturition: Secondary | ICD-10-CM | POA: Diagnosis not present

## 2015-10-26 DIAGNOSIS — Z6825 Body mass index (BMI) 25.0-25.9, adult: Secondary | ICD-10-CM | POA: Diagnosis not present

## 2015-10-26 DIAGNOSIS — J441 Chronic obstructive pulmonary disease with (acute) exacerbation: Secondary | ICD-10-CM | POA: Diagnosis not present

## 2015-10-26 DIAGNOSIS — R05 Cough: Secondary | ICD-10-CM | POA: Diagnosis not present

## 2015-12-02 ENCOUNTER — Other Ambulatory Visit: Payer: Self-pay | Admitting: Internal Medicine

## 2016-01-11 ENCOUNTER — Encounter: Payer: Medicare Other | Admitting: Internal Medicine

## 2016-02-08 ENCOUNTER — Other Ambulatory Visit: Payer: Self-pay | Admitting: Internal Medicine

## 2016-02-08 NOTE — Telephone Encounter (Signed)
REFILL 

## 2016-02-10 ENCOUNTER — Encounter: Payer: Self-pay | Admitting: Internal Medicine

## 2016-02-10 ENCOUNTER — Ambulatory Visit (INDEPENDENT_AMBULATORY_CARE_PROVIDER_SITE_OTHER): Payer: Commercial Managed Care - HMO | Admitting: Internal Medicine

## 2016-02-10 VITALS — BP 168/72 | HR 61 | Ht 64.0 in | Wt 162.8 lb

## 2016-02-10 DIAGNOSIS — I495 Sick sinus syndrome: Secondary | ICD-10-CM

## 2016-02-10 DIAGNOSIS — Z950 Presence of cardiac pacemaker: Secondary | ICD-10-CM | POA: Diagnosis not present

## 2016-02-10 DIAGNOSIS — I480 Paroxysmal atrial fibrillation: Secondary | ICD-10-CM | POA: Diagnosis not present

## 2016-02-10 LAB — CUP PACEART INCLINIC DEVICE CHECK
Battery Remaining Longevity: 116.4
Brady Statistic RV Percent Paced: 1.4 %
Implantable Lead Implant Date: 20130125
Implantable Lead Location: 753859
Lead Channel Impedance Value: 362.5 Ohm
Lead Channel Pacing Threshold Amplitude: 1 V
Lead Channel Pacing Threshold Amplitude: 1.25 V
Lead Channel Pacing Threshold Pulse Width: 0.5 ms
Lead Channel Pacing Threshold Pulse Width: 1 ms
Lead Channel Sensing Intrinsic Amplitude: 6.8 mV
Lead Channel Setting Pacing Amplitude: 1.5 V
Lead Channel Setting Pacing Amplitude: 1.75 V
Lead Channel Setting Sensing Sensitivity: 2 mV
MDC IDC LEAD IMPLANT DT: 20130125
MDC IDC LEAD LOCATION: 753860
MDC IDC MSMT BATTERY VOLTAGE: 2.95 V
MDC IDC MSMT LEADCHNL RA IMPEDANCE VALUE: 325 Ohm
MDC IDC MSMT LEADCHNL RA SENSING INTR AMPL: 3.8 mV
MDC IDC SESS DTM: 20170517165621
MDC IDC SET LEADCHNL RV PACING PULSEWIDTH: 1 ms
MDC IDC STAT BRADY RA PERCENT PACED: 77 %
Pulse Gen Serial Number: 7300824

## 2016-02-10 NOTE — Progress Notes (Signed)
Patient Care Team: Haywood Pao, MD as PCP - General   HPI  Mikayla Harper is a 82 y.o. female  Seen in followup for a pacemaker implanted January 2013 for tachybradycardia syndrome with paroxysmal atrial fibrillation as well as prolonged pauses;   She has had intercurrent problems with GI bleeding attributed to AVMs i and her Rivaroxaban  was discontinued And aspirin 325 was put in its place.   She has been on chronic propafenone. She says she has not had recurrence since 2003.    She has had what was felt to be a recent TIA.  Echocardiogram 1/13 demonstrated normal left ventricular function and normal left atrial size and normal left atrial size there was diastolic dysfunction  Thromboembolic risk factors are notable for gender and age and prior TIA. She does has a CHADS-VASc score of 5;   Propafenone QRS d  2/15  88  2/16 106  5/17 92    Past Medical History  Diagnosis Date  . PAF (paroxysmal atrial fibrillation) ([place])     previously on propafenone  . Carpal tunnel syndrome of left wrist 12/20/2010    left hand  . Influenza-like illness 2009    H1N1 PCR negative  . Urinary tract infection   . Hyperlipidemia     on Mevacor  . Osteopenia   . TIA (transient ischemic attack) Dec 2012  . Tuberculosis      hx of over 50 years ago "  . Sick sinus syndrome ([place])   . Pacemaker - [facility]     Implant Jan 2013  . Anemia     Past Surgical History  Procedure Laterality Date  . Back surgery  1976  . Other surgical history  In the late 1960's    Hysterectomy secondary to fibroids  . Other surgical history      cataract surgery  . Abdominal hysterectomy    . Insert / replace / remove pacemaker  10/21/2011  . Esophagogastroduodenoscopy  08/01/2012    Procedure: ESOPHAGOGASTRODUODENOSCOPY (EGD);  Surgeon: Milus Banister, MD;  Location: [facility] ENDOSCOPY;  Service: Endoscopy;  Laterality: N/A;  . Colonoscopy  08/01/2012    Procedure: COLONOSCOPY;  Surgeon: Milus Banister, MD;  Location: [facility] ENDOSCOPY;  Service: Endoscopy;  Laterality: N/A;  . Cardioversion N/A 11/13/2012    Procedure: CARDIOVERSION;  Surgeon: Peter M Martinique, MD;  Location: [facility] ENDOSCOPY;  Service: Cardiovascular;  Laterality: N/A;  . Permanent pacemaker insertion N/A 10/21/2011    Procedure: PERMANENT PACEMAKER INSERTION;  Surgeon: Deboraha Sprang, MD;  Location: [facility] CATH LAB;  Service: Cardiovascular;  Laterality: N/A;    Current Outpatient Prescriptions  Medication Sig Dispense Refill  . albuterol (PROVENTIL HFA;VENTOLIN HFA) 108 (90 BASE) MCG/ACT inhaler Inhale 2 puffs into the lungs every 6 (six) hours as needed for wheezing.    Marland Kitchen aspirin 325 MG tablet Take 81 mg by mouth daily.    . cetirizine (ZYRTEC) 10 MG tablet Take 10 mg by mouth daily as needed for allergies. Takes only in spring/summer    . diltiazem (CARTIA XT) 120 MG 24 hr capsule Take 1 capsule (120 mg total) by mouth daily. 90 capsule 1  . ferrous sulfate 325 (65 FE) MG tablet Take 325 mg by mouth daily with breakfast.    . lovastatin (MEVACOR) 20 MG tablet Take 20 mg by mouth at bedtime.    . mometasone-formoterol (DULERA) 100-5 MCG/ACT AERO Inhale 2 puffs into the lungs as needed for  wheezing or shortness of breath.     . Multiple Vitamins-Minerals (WOMENS MULTIVITAMIN PLUS PO) Take 1 tablet by mouth daily.    Marland Kitchen oxybutynin (DITROPAN) 5 MG tablet Take 5 mg by mouth daily.     . propafenone (RYTHMOL) 225 MG tablet TAKE 1 TABLET EVERY 8 HOURS 270 tablet 0  . temazepam (RESTORIL) 15 MG capsule Take 15 mg by mouth at bedtime as needed for sleep.    . traMADol (ULTRAM) 50 MG tablet Take 50 mg by mouth every 6 (six) hours as needed for pain.     No current facility-administered medications for this visit.    Allergies  Allergen Reactions  . Lisinopril Other (See Comments)    Dizzy and lightheadedness    Review of Systems negative except from HPI and PMH  Physical Exam BP 168/72 mmHg  Pulse 61  Ht 5\' 4"  (1.626 m)  Wt  162 lb 12.8 oz (73.846 kg)  BMI 27.93 kg/m2 Well developed and well nourished in no acute distress HENT normal E scleral and icterus clear Neck Supple JVP flat; carotids brisk and full Clear to ausculation Device pocket well healed; without hematoma or erythema.  There is no tetheringRegular rate and rhythm, no murmurs gallops or rub Soft with active bowel sounds No clubbing cyanosis none Edema Alert and oriented, grossly normal motor and sensory function Skin Warm and Dry  ECG demonstrates atrial pacing at 60 Interval 20/11/44  Assessment and  Plan  Atrial fibrillation-paroxysmal  Pacemaker-St. Jude  Sinus node dysfunction The patient's device was interrogated.  The information was reviewed. No changes were made in the programming.     QRS widening  QRS is narrow. He has some recurrence of atrial arrhythmia are very brief.  GI bleeding in the past has been a problem; she remains on low-dose aspirin. In the event that she had longer episodes of atrial fibrillation I would consider trial of apixoban     Her blood pressure is elevated. I've asked her to check it at the local pharmacies and then reportedly stated to Dr. Kandra Nicolas neck

## 2016-02-23 ENCOUNTER — Other Ambulatory Visit: Payer: Self-pay | Admitting: Internal Medicine

## 2016-03-04 DIAGNOSIS — M859 Disorder of bone density and structure, unspecified: Secondary | ICD-10-CM | POA: Diagnosis not present

## 2016-03-04 DIAGNOSIS — D692 Other nonthrombocytopenic purpura: Secondary | ICD-10-CM | POA: Diagnosis not present

## 2016-03-04 DIAGNOSIS — J449 Chronic obstructive pulmonary disease, unspecified: Secondary | ICD-10-CM | POA: Diagnosis not present

## 2016-03-04 DIAGNOSIS — I480 Paroxysmal atrial fibrillation: Secondary | ICD-10-CM | POA: Diagnosis not present

## 2016-03-04 DIAGNOSIS — D500 Iron deficiency anemia secondary to blood loss (chronic): Secondary | ICD-10-CM | POA: Diagnosis not present

## 2016-03-04 DIAGNOSIS — I1310 Hypertensive heart and chronic kidney disease without heart failure, with stage 1 through stage 4 chronic kidney disease, or unspecified chronic kidney disease: Secondary | ICD-10-CM | POA: Diagnosis not present

## 2016-03-04 DIAGNOSIS — N183 Chronic kidney disease, stage 3 (moderate): Secondary | ICD-10-CM | POA: Diagnosis not present

## 2016-03-04 DIAGNOSIS — Z950 Presence of cardiac pacemaker: Secondary | ICD-10-CM | POA: Diagnosis not present

## 2016-03-04 DIAGNOSIS — I495 Sick sinus syndrome: Secondary | ICD-10-CM | POA: Diagnosis not present

## 2016-03-24 DIAGNOSIS — D500 Iron deficiency anemia secondary to blood loss (chronic): Secondary | ICD-10-CM | POA: Diagnosis not present

## 2016-04-13 ENCOUNTER — Other Ambulatory Visit: Payer: Self-pay | Admitting: Internal Medicine

## 2016-04-14 DIAGNOSIS — H6121 Impacted cerumen, right ear: Secondary | ICD-10-CM | POA: Diagnosis not present

## 2016-04-14 DIAGNOSIS — J441 Chronic obstructive pulmonary disease with (acute) exacerbation: Secondary | ICD-10-CM | POA: Diagnosis not present

## 2016-04-14 DIAGNOSIS — Z6825 Body mass index (BMI) 25.0-25.9, adult: Secondary | ICD-10-CM | POA: Diagnosis not present

## 2016-04-14 DIAGNOSIS — I480 Paroxysmal atrial fibrillation: Secondary | ICD-10-CM | POA: Diagnosis not present

## 2016-04-14 DIAGNOSIS — D500 Iron deficiency anemia secondary to blood loss (chronic): Secondary | ICD-10-CM | POA: Diagnosis not present

## 2016-05-11 ENCOUNTER — Encounter: Payer: Commercial Managed Care - HMO | Admitting: *Deleted

## 2016-05-11 ENCOUNTER — Telehealth: Payer: Self-pay | Admitting: Cardiology

## 2016-05-11 NOTE — Telephone Encounter (Signed)
LMOVM reminding pt to send remote transmission.   

## 2016-05-12 DIAGNOSIS — Z23 Encounter for immunization: Secondary | ICD-10-CM | POA: Diagnosis not present

## 2016-05-13 ENCOUNTER — Encounter: Payer: Self-pay | Admitting: Cardiology

## 2016-05-18 ENCOUNTER — Emergency Department (HOSPITAL_COMMUNITY): Payer: Commercial Managed Care - HMO

## 2016-05-18 ENCOUNTER — Emergency Department (HOSPITAL_COMMUNITY)
Admission: EM | Admit: 2016-05-18 | Discharge: 2016-05-18 | Disposition: A | Discharge status: Home / Self Care | Payer: Commercial Managed Care - HMO | Attending: Emergency Medicine | Admitting: Emergency Medicine

## 2016-05-18 ENCOUNTER — Encounter (HOSPITAL_COMMUNITY): Payer: Self-pay | Admitting: Emergency Medicine

## 2016-05-18 DIAGNOSIS — J441 Chronic obstructive pulmonary disease with (acute) exacerbation: Secondary | ICD-10-CM | POA: Insufficient documentation

## 2016-05-18 DIAGNOSIS — R0789 Other chest pain: Secondary | ICD-10-CM

## 2016-05-18 DIAGNOSIS — Z7951 Long term (current) use of inhaled steroids: Secondary | ICD-10-CM | POA: Insufficient documentation

## 2016-05-18 DIAGNOSIS — Z8673 Personal history of transient ischemic attack (TIA), and cerebral infarction without residual deficits: Secondary | ICD-10-CM | POA: Insufficient documentation

## 2016-05-18 DIAGNOSIS — Z79899 Other long term (current) drug therapy: Secondary | ICD-10-CM | POA: Insufficient documentation

## 2016-05-18 DIAGNOSIS — Z87891 Personal history of nicotine dependence: Secondary | ICD-10-CM | POA: Insufficient documentation

## 2016-05-18 DIAGNOSIS — I10 Essential (primary) hypertension: Secondary | ICD-10-CM | POA: Insufficient documentation

## 2016-05-18 DIAGNOSIS — R079 Chest pain, unspecified: Secondary | ICD-10-CM | POA: Diagnosis not present

## 2016-05-18 LAB — CBC WITH DIFFERENTIAL/PLATELET
BASOS ABS: 0.1 10*3/uL (ref 0.0–0.1)
BASOS PCT: 1 %
EOS ABS: 0.2 10*3/uL (ref 0.0–0.7)
Eosinophils Relative: 3 %
HEMATOCRIT: 37.1 % (ref 36.0–46.0)
HEMOGLOBIN: 11.3 g/dL — AB (ref 12.0–15.0)
LYMPHS PCT: 26 %
Lymphs Abs: 1.6 10*3/uL (ref 0.7–4.0)
MCH: 26.1 pg (ref 26.0–34.0)
MCHC: 30.5 g/dL (ref 30.0–36.0)
MCV: 85.7 fL (ref 78.0–100.0)
MONOS PCT: 11 %
Monocytes Absolute: 0.7 10*3/uL (ref 0.1–1.0)
NEUTROS PCT: 59 %
Neutro Abs: 3.5 10*3/uL (ref 1.7–7.7)
Platelets: 274 10*3/uL (ref 150–400)
RBC: 4.33 MIL/uL (ref 3.87–5.11)
RDW: 24.9 % — ABNORMAL HIGH (ref 11.5–15.5)
WBC: 6.1 10*3/uL (ref 4.0–10.5)

## 2016-05-18 LAB — I-STAT CHEM 8, ED
BUN: 22 mg/dL — AB (ref 6–20)
CHLORIDE: 100 mmol/L — AB (ref 101–111)
CREATININE: 1.2 mg/dL — AB (ref 0.44–1.00)
Calcium, Ion: 1.28 mmol/L — ABNORMAL HIGH (ref 1.12–1.23)
Glucose, Bld: 117 mg/dL — ABNORMAL HIGH (ref 65–99)
HEMATOCRIT: 41 % (ref 36.0–46.0)
Hemoglobin: 13.9 g/dL (ref 12.0–15.0)
Potassium: 4.4 mmol/L (ref 3.5–5.1)
SODIUM: 136 mmol/L (ref 135–145)
TCO2: 26 mmol/L (ref 0–100)

## 2016-05-18 LAB — I-STAT TROPONIN, ED: TROPONIN I, POC: 0.01 ng/mL (ref 0.00–0.08)

## 2016-05-18 NOTE — ED Notes (Addendum)
Patient's daughter came out to nurse's desk requesting to know test results.  Daughter informed that we would notify Dr Alvino Chapel to come in and go over the test results with patient.   Left message for Dr Alvino Chapel to discuss results with patient when he got out of another patient's room.

## 2016-05-18 NOTE — ED Notes (Signed)
Per Aaron Edelman at [facility], patient has pacemaker not a defibrillator and shouldn't be getting shocked.  He also states that Her readings looked great.

## 2016-05-18 NOTE — ED Triage Notes (Signed)
Patient here with complaints of chest pain that start suddenly today. Patient states that her pace maker "keeps shocking her". Centralized pain. Denies nausea, vomiting.

## 2016-05-18 NOTE — ED Provider Notes (Addendum)
[facility] DEPT Provider Note   CSN: [id number] Arrival date & time: 05/18/16  1303     History   Chief Complaint Chief Complaint  Patient presents with  . Chest Pain    HPI Mikayla Harper is a 82 y.o. female.  The history is provided by the patient.  Patient presents with pain in her left chest at the site of her pacemaker. Patient family member states it is been shocking her today. States it would come and go and be sharp. States it was somewhat like the beating of her heart. When asked if it was a pacemaker or it could shock her like an AICD they said that it was shocking her. No fevers or chills. She feels somewhat better now. She sees Dr. Caryl Comes.  Past Medical History:  Diagnosis Date  . Anemia   . Carpal tunnel syndrome of left wrist 12/20/2010   left hand  . Hyperlipidemia    on Mevacor  . Influenza-like illness 2009   H1N1 PCR negative  . Osteopenia   . Pacemaker - [facility]    Implant Jan 2013  . PAF (paroxysmal atrial fibrillation) ([place])    previously on propafenone  . Sick sinus syndrome ([place])   . TIA (transient ischemic attack) Dec 2012  . Tuberculosis     hx of over 50 years ago "  . Urinary tract infection     Patient Active Problem List   Diagnosis Date Noted  . COPD exacerbation ([place]) 01/07/2013  . Chronic cough 12/04/2012  . Dyspnea on exertion 10/22/2012  . COPD (chronic obstructive pulmonary disease) ([place]) 10/17/2012  . Anemia 08/01/2012  . Benign neoplasm of colon 08/01/2012  . Diverticulosis of colon (without mention of hemorrhage) 08/01/2012  . Insomnia 01/24/2012  . Pacemaker-St Judes 01/24/2012  . Sick sinus syndrome ([place])   . TIA (transient ischemic attack) 10/11/2011  . HTN (hypertension) 10/11/2011  . Atrial fibrillation ([place]) 10/06/2011  . HYPERLIPIDEMIA 10/22/2010    Past Surgical History:  Procedure Laterality Date  . ABDOMINAL HYSTERECTOMY    . BACK SURGERY  1976  . CARDIOVERSION N/A 11/13/2012   Procedure:  CARDIOVERSION;  Surgeon: Peter M Martinique, MD;  Location: [facility] ENDOSCOPY;  Service: Cardiovascular;  Laterality: N/A;  . COLONOSCOPY  08/01/2012   Procedure: COLONOSCOPY;  Surgeon: Milus Banister, MD;  Location: [facility] ENDOSCOPY;  Service: Endoscopy;  Laterality: N/A;  . ESOPHAGOGASTRODUODENOSCOPY  08/01/2012   Procedure: ESOPHAGOGASTRODUODENOSCOPY (EGD);  Surgeon: Milus Banister, MD;  Location: [facility] ENDOSCOPY;  Service: Endoscopy;  Laterality: N/A;  . INSERT / REPLACE / REMOVE PACEMAKER  10/21/2011  . OTHER SURGICAL HISTORY  In the late 1960's   Hysterectomy secondary to fibroids  . OTHER SURGICAL HISTORY     cataract surgery  . PERMANENT PACEMAKER INSERTION N/A 10/21/2011   Procedure: PERMANENT PACEMAKER INSERTION;  Surgeon: Deboraha Sprang, MD;  Location: [facility] CATH LAB;  Service: Cardiovascular;  Laterality: N/A;    OB History    No data available       Home Medications    Prior to Admission medications   Medication Sig Start Date End Date Taking? Authorizing Provider  albuterol (PROVENTIL HFA;VENTOLIN HFA) 108 (90 BASE) MCG/ACT inhaler Inhale 2 puffs into the lungs every 6 (six) hours as needed for wheezing.   Yes Historical Provider, MD  diltiazem (CARTIA XT) 120 MG 24 hr capsule Take 1 capsule (120 mg total) by mouth daily. 02/24/16  Yes Deboraha Sprang, MD  ferrous sulfate 325 (65 FE)  MG tablet Take 325 mg by mouth daily with breakfast.   Yes Historical Provider, MD  lovastatin (MEVACOR) 20 MG tablet Take 20 mg by mouth at bedtime.   Yes Historical Provider, MD  mometasone-formoterol (DULERA) 100-5 MCG/ACT AERO Inhale 2 puffs into the lungs as needed for wheezing or shortness of breath.    Yes Historical Provider, MD  Multiple Vitamins-Minerals (WOMENS MULTIVITAMIN PLUS PO) Take 1 tablet by mouth daily.   Yes Historical Provider, MD  oxybutynin (DITROPAN) 5 MG tablet Take 5 mg by mouth daily.  10/07/11  Yes Historical Provider, MD  propafenone (RYTHMOL) 225 MG tablet TAKE 1 TABLET EVERY 8 HOURS  04/13/16  Yes Deboraha Sprang, MD  temazepam (RESTORIL) 15 MG capsule Take 15 mg by mouth at bedtime as needed for sleep.   Yes Historical Provider, MD    Family History Family History  Problem Relation Age of Onset  . Dementia Mother   . Emphysema Father   . Heart disease Father   . Lymphoma Daughter     in bone    Social History Social History  Substance Use Topics  . Smoking status: Former Smoker    Packs/day: 1.00    Years: 30.00    Types: Cigarettes    Quit date: 10/05/1988  . Smokeless tobacco: Never Used  . Alcohol use No     Allergies   Lisinopril   Review of Systems Review of Systems  Constitutional: Negative for appetite change.  Respiratory: Negative for shortness of breath.   Cardiovascular: Positive for chest pain.  Gastrointestinal: Negative for abdominal pain.  Genitourinary: Negative for dyspareunia.  Musculoskeletal: Negative for back pain.     Physical Exam Updated Vital Signs BP 148/72 (BP Location: Right Arm)   Pulse 61   Temp 97.7 F (36.5 C) (Oral)   SpO2 98%   Physical Exam  Constitutional: She appears well-developed.  HENT:  Head: Atraumatic.  Eyes: Conjunctivae are normal.  Neck: Neck supple.  Cardiovascular: Normal rate.   Pulmonary/Chest: Effort normal.  No tenderness to site of pacemaker. No skin change.  Abdominal: There is no tenderness.  Musculoskeletal: Normal range of motion.  Neurological: She is alert.  Skin: Skin is warm. Capillary refill takes less than 2 seconds.     ED Treatments / Results  Labs (all labs ordered are listed, but only abnormal results are displayed) Labs Reviewed  CBC WITH DIFFERENTIAL/PLATELET - Abnormal; Notable for the following:       Result Value   Hemoglobin 11.3 (*)    RDW 24.9 (*)    All other components within normal limits  I-STAT CHEM 8, ED - Abnormal; Notable for the following:    Chloride 100 (*)    BUN 22 (*)    Creatinine, Ser 1.20 (*)    Glucose, Bld 117 (*)    Calcium,  Ion 1.28 (*)    All other components within normal limits  I-STAT TROPOININ, ED    EKG  EKG Interpretation  Date/Time:  Wednesday May 18 2016 13:07:08 EDT Ventricular Rate:  75 PR Interval:    QRS Duration: 113 QT Interval:  428 QTC Calculation: 479 R Axis:   -38 Text Interpretation:  Sinus rhythm Short PR interval Borderline IVCD with LAD Low voltage, extremity and precordial leads Baseline wander in lead(s) V1 Confirmed by Alvino Chapel  MD, Kenyan Karnes [phone]) on 05/18/2016 1:37:21 PM       Radiology Dg Chest 2 View  Result Date: 05/18/2016 CLINICAL DATA:  Chest pain. EXAM:  CHEST  2 VIEW COMPARISON:  02/07/2013 FINDINGS: Stable appearance of dual-chamber pacemaker. The heart size is normal. Stable calcified granuloma of the right upper lung. There is no evidence of pulmonary edema, consolidation, pneumothorax, nodule or pleural fluid. Visualized bony structures are unremarkable. IMPRESSION: No active cardiopulmonary disease. Electronically Signed   By: Aletta Edouard M.D.   On: 05/18/2016 14:28    Procedures Procedures (including critical care time)  Medications Ordered in ED Medications - No data to display   Initial Impression / Assessment and Plan / ED Course  I have reviewed the triage vital signs and the nursing notes.  Pertinent labs & imaging results that were available during my care of the patient were reviewed by me and considered in my medical decision making (see chart for details).  Clinical Course    Patient with episodic spasmodic pain of her left chest wall. At site of pacemaker. She does not have an AICD and the patient interrogated and per the representatives did not have any abnormalities that would cause pain like this. Patient is reassured. Does not appear to be cardiac cause. Will discharge home.  Final Clinical Impressions(s) / ED Diagnoses   Final diagnoses:  None    New Prescriptions New Prescriptions   No medications on file     Davonna Belling, MD 05/18/16 [place], MD 06/22/16 [phone]

## 2016-05-23 ENCOUNTER — Telehealth: Payer: Self-pay | Admitting: Internal Medicine

## 2016-05-23 NOTE — Telephone Encounter (Signed)
Pt stated that she never received a home monitor. Pt stated that she would just prefer to come in to the office every 6 months. Pt had apt 07/2016 for PPM check in device clinic. Informed pt that it is ok for to come into the office every 6 months if that is what she prefers to do.

## 2016-05-23 NOTE — Telephone Encounter (Signed)
Mikayla Harper is calling because she says she is supposed to send in a transmission and she does not know how to do that , she always come in to see Dr. Caryl Comes . Please call

## 2016-06-21 DIAGNOSIS — N390 Urinary tract infection, site not specified: Secondary | ICD-10-CM | POA: Diagnosis not present

## 2016-06-21 DIAGNOSIS — R350 Frequency of micturition: Secondary | ICD-10-CM | POA: Diagnosis not present

## 2016-08-11 ENCOUNTER — Ambulatory Visit (INDEPENDENT_AMBULATORY_CARE_PROVIDER_SITE_OTHER): Payer: Commercial Managed Care - HMO | Admitting: *Deleted

## 2016-08-11 DIAGNOSIS — I495 Sick sinus syndrome: Secondary | ICD-10-CM | POA: Diagnosis not present

## 2016-08-11 LAB — CUP PACEART INCLINIC DEVICE CHECK
Battery Remaining Longevity: 120 mo
Battery Voltage: 2.95 V
Brady Statistic RA Percent Paced: 73 %
Brady Statistic RV Percent Paced: 1.1 %
Implantable Lead Implant Date: 20130125
Implantable Lead Location: 753860
Lead Channel Impedance Value: 375 Ohm
Lead Channel Pacing Threshold Amplitude: 0.75 V
Lead Channel Pacing Threshold Pulse Width: 0.5 ms
Lead Channel Pacing Threshold Pulse Width: 1 ms
Lead Channel Sensing Intrinsic Amplitude: 3.7 mV
Lead Channel Setting Pacing Amplitude: 1.75 V
MDC IDC LEAD IMPLANT DT: 20130125
MDC IDC LEAD LOCATION: 753859
MDC IDC MSMT LEADCHNL RV IMPEDANCE VALUE: 412.5 Ohm
MDC IDC MSMT LEADCHNL RV PACING THRESHOLD AMPLITUDE: 1 V
MDC IDC MSMT LEADCHNL RV SENSING INTR AMPL: 7.5 mV
MDC IDC PG IMPLANT DT: 20130125
MDC IDC PG SERIAL: 7300824
MDC IDC SESS DTM: 20171116091904
MDC IDC SET LEADCHNL RA PACING AMPLITUDE: 1.75 V
MDC IDC SET LEADCHNL RV PACING PULSEWIDTH: 1 ms
MDC IDC SET LEADCHNL RV SENSING SENSITIVITY: 2 mV

## 2016-08-11 NOTE — Progress Notes (Signed)
Pacemaker check in clinic. Normal device function. Thresholds, sensing, impedances consistent with previous measurements. Device programmed to maximize longevity. 133 mode switches (<1%)- no OAC d/t hx of bleeding. EGMs show atrial tach. No high ventricular rates noted. Device programmed at appropriate safety margins. Histogram distribution appropriate for patient activity level. Device programmed to optimize intrinsic conduction. Estimated longevity 7.9-48yrs. No home monitor checks. ROV with SK 01/2017. Patient education completed.

## 2016-08-17 DIAGNOSIS — M859 Disorder of bone density and structure, unspecified: Secondary | ICD-10-CM | POA: Diagnosis not present

## 2016-08-17 DIAGNOSIS — R8299 Other abnormal findings in urine: Secondary | ICD-10-CM | POA: Diagnosis not present

## 2016-08-17 DIAGNOSIS — E7800 Pure hypercholesterolemia, unspecified: Secondary | ICD-10-CM | POA: Diagnosis not present

## 2016-08-17 DIAGNOSIS — N390 Urinary tract infection, site not specified: Secondary | ICD-10-CM | POA: Diagnosis not present

## 2016-08-24 DIAGNOSIS — Z0000 Encounter for general adult medical examination without abnormal findings: Secondary | ICD-10-CM | POA: Diagnosis not present

## 2016-08-24 DIAGNOSIS — I495 Sick sinus syndrome: Secondary | ICD-10-CM | POA: Diagnosis not present

## 2016-08-24 DIAGNOSIS — I1310 Hypertensive heart and chronic kidney disease without heart failure, with stage 1 through stage 4 chronic kidney disease, or unspecified chronic kidney disease: Secondary | ICD-10-CM | POA: Diagnosis not present

## 2016-08-24 DIAGNOSIS — Z950 Presence of cardiac pacemaker: Secondary | ICD-10-CM | POA: Diagnosis not present

## 2016-08-24 DIAGNOSIS — E7800 Pure hypercholesterolemia, unspecified: Secondary | ICD-10-CM | POA: Diagnosis not present

## 2016-08-24 DIAGNOSIS — D692 Other nonthrombocytopenic purpura: Secondary | ICD-10-CM | POA: Diagnosis not present

## 2016-08-24 DIAGNOSIS — J449 Chronic obstructive pulmonary disease, unspecified: Secondary | ICD-10-CM | POA: Diagnosis not present

## 2016-08-24 DIAGNOSIS — D508 Other iron deficiency anemias: Secondary | ICD-10-CM | POA: Diagnosis not present

## 2016-08-24 DIAGNOSIS — N183 Chronic kidney disease, stage 3 (moderate): Secondary | ICD-10-CM | POA: Diagnosis not present

## 2016-09-15 DIAGNOSIS — R8299 Other abnormal findings in urine: Secondary | ICD-10-CM | POA: Diagnosis not present

## 2016-09-15 DIAGNOSIS — N390 Urinary tract infection, site not specified: Secondary | ICD-10-CM | POA: Diagnosis not present

## 2016-12-15 ENCOUNTER — Other Ambulatory Visit: Payer: Self-pay | Admitting: Internal Medicine

## 2016-12-15 MED ORDER — DILTIAZEM HCL ER COATED BEADS 120 MG PO CP24: 120.0000 mg | ORAL_CAPSULE | Freq: Every day | ORAL | 0 refills | Status: DC

## 2017-02-01 ENCOUNTER — Encounter: Payer: Self-pay | Admitting: Cardiovascular Disease

## 2017-02-21 DIAGNOSIS — Z950 Presence of cardiac pacemaker: Secondary | ICD-10-CM | POA: Diagnosis not present

## 2017-02-21 DIAGNOSIS — J449 Chronic obstructive pulmonary disease, unspecified: Secondary | ICD-10-CM | POA: Diagnosis not present

## 2017-02-21 DIAGNOSIS — I480 Paroxysmal atrial fibrillation: Secondary | ICD-10-CM | POA: Diagnosis not present

## 2017-02-21 DIAGNOSIS — N3281 Overactive bladder: Secondary | ICD-10-CM | POA: Diagnosis not present

## 2017-02-21 DIAGNOSIS — D508 Other iron deficiency anemias: Secondary | ICD-10-CM | POA: Diagnosis not present

## 2017-02-21 DIAGNOSIS — E7800 Pure hypercholesterolemia, unspecified: Secondary | ICD-10-CM | POA: Diagnosis not present

## 2017-02-21 DIAGNOSIS — D692 Other nonthrombocytopenic purpura: Secondary | ICD-10-CM | POA: Diagnosis not present

## 2017-02-21 DIAGNOSIS — M859 Disorder of bone density and structure, unspecified: Secondary | ICD-10-CM | POA: Diagnosis not present

## 2017-02-21 DIAGNOSIS — N183 Chronic kidney disease, stage 3 (moderate): Secondary | ICD-10-CM | POA: Diagnosis not present

## 2017-02-23 ENCOUNTER — Ambulatory Visit (INDEPENDENT_AMBULATORY_CARE_PROVIDER_SITE_OTHER): Payer: Medicare HMO | Admitting: Internal Medicine

## 2017-02-23 ENCOUNTER — Encounter: Payer: Self-pay | Admitting: Internal Medicine

## 2017-02-23 VITALS — BP 122/70 | HR 72 | Ht 65.0 in | Wt 151.0 lb

## 2017-02-23 DIAGNOSIS — Z950 Presence of cardiac pacemaker: Secondary | ICD-10-CM | POA: Diagnosis not present

## 2017-02-23 DIAGNOSIS — I480 Paroxysmal atrial fibrillation: Secondary | ICD-10-CM

## 2017-02-23 DIAGNOSIS — I495 Sick sinus syndrome: Secondary | ICD-10-CM

## 2017-02-23 LAB — CUP PACEART INCLINIC DEVICE CHECK
Battery Voltage: 2.93 V
Brady Statistic RA Percent Paced: 72 %
Brady Statistic RV Percent Paced: 1.2 %
Date Time Interrogation Session: 20180531163236
Implantable Lead Implant Date: 20130125
Implantable Lead Location: 753860
Implantable Pulse Generator Implant Date: 20130125
Lead Channel Impedance Value: 350 Ohm
Lead Channel Impedance Value: 450 Ohm
Lead Channel Pacing Threshold Amplitude: 1.5 V
Lead Channel Pacing Threshold Pulse Width: 1 ms
Lead Channel Sensing Intrinsic Amplitude: 2.8 mV
Lead Channel Setting Pacing Amplitude: 1.75 V
Lead Channel Setting Pacing Amplitude: 1.75 V
Lead Channel Setting Pacing Pulse Width: 1 ms
MDC IDC LEAD IMPLANT DT: 20130125
MDC IDC LEAD LOCATION: 753859
MDC IDC MSMT LEADCHNL RA PACING THRESHOLD AMPLITUDE: 0.75 V
MDC IDC MSMT LEADCHNL RA PACING THRESHOLD PULSEWIDTH: 0.5 ms
MDC IDC MSMT LEADCHNL RV SENSING INTR AMPL: 6.8 mV
MDC IDC PG SERIAL: 7300824
MDC IDC SET LEADCHNL RV SENSING SENSITIVITY: 2 mV
Pulse Gen Model: 2110

## 2017-02-23 NOTE — Patient Instructions (Addendum)
Medication Instructions: - Your physician recommends that you continue on your current medications as directed. Please refer to the Current Medication list given to you today.  Labwork: - none ordered  Procedures/Testing: - none ordered  Follow-Up: - Your physician wants you to follow-up in: 6 months with the Device Clinic & 1 year with Dr. Klein. You will receive a reminder letter in the mail two months in advance. If you don't receive a letter, please call our office to schedule the follow-up appointment.   Any Additional Special Instructions Will Be Listed Below (If Applicable).     If you need a refill on your cardiac medications before your next appointment, please call your pharmacy.   

## 2017-02-23 NOTE — Progress Notes (Signed)
Patient Care Team: Tisovec, Fransico Him, MD as PCP - General   HPI  Mikayla Harper is a 83 y.o. female  Seen in followup for a pacemaker implanted January 2013 for tachybradycardia syndrome with paroxysmal atrial fibrillation as well as prolonged pauses;   She has had intercurrent problems with GI bleeding attributed to AVMs and her Rivaroxaban  was discontinued   She has been on chronic propafenone. She says she has not had recurrence since 2003.    She has had what was felt to be a recent TIA.   The patient denies chest pain, shortness of breath, nocturnal dyspnea, orthopnea or peripheral edema.  There have been no palpitations, lightheadedness or syncope.    Her husband is 25 years died one month ago. His name was [name].  Echocardiogram 1/13 demonstrated normal left ventricular function and normal left atrial size and normal left atrial size there was diastolic dysfunction  Thromboembolic risk factors are notable for gender and age and prior TIA. She does has a CHADS-VASc score of 5;    Date PR interval QRS d  2/15   88  2/16  106  5/17 21 92  5/18 24 102        Past Medical History:  Diagnosis Date  . Anemia   . Carpal tunnel syndrome of left wrist 12/20/2010   left hand  . Hyperlipidemia    on Mevacor  . Influenza-like illness 2009   H1N1 PCR negative  . Osteopenia   . Pacemaker - [facility]    Implant Jan 2013  . PAF (paroxysmal atrial fibrillation) ([place])    previously on propafenone  . Sick sinus syndrome ([place])   . TIA (transient ischemic attack) Dec 2012  . Tuberculosis     hx of over 50 years ago "  . Urinary tract infection     Past Surgical History:  Procedure Laterality Date  . ABDOMINAL HYSTERECTOMY    . BACK SURGERY  1976  . CARDIOVERSION N/A 11/13/2012   Procedure: CARDIOVERSION;  Surgeon: Peter M Martinique, MD;  Location: [facility] ENDOSCOPY;  Service: Cardiovascular;  Laterality: N/A;  . COLONOSCOPY  08/01/2012   Procedure: COLONOSCOPY;  Surgeon:  Milus Banister, MD;  Location: [facility] ENDOSCOPY;  Service: Endoscopy;  Laterality: N/A;  . ESOPHAGOGASTRODUODENOSCOPY  08/01/2012   Procedure: ESOPHAGOGASTRODUODENOSCOPY (EGD);  Surgeon: Milus Banister, MD;  Location: [facility] ENDOSCOPY;  Service: Endoscopy;  Laterality: N/A;  . INSERT / REPLACE / REMOVE PACEMAKER  10/21/2011  . OTHER SURGICAL HISTORY  In the late 1960's   Hysterectomy secondary to fibroids  . OTHER SURGICAL HISTORY     cataract surgery  . PERMANENT PACEMAKER INSERTION N/A 10/21/2011   Procedure: PERMANENT PACEMAKER INSERTION;  Surgeon: Deboraha Sprang, MD;  Location: [facility] CATH LAB;  Service: Cardiovascular;  Laterality: N/A;    Current Outpatient Prescriptions  Medication Sig Dispense Refill  . albuterol (PROVENTIL HFA;VENTOLIN HFA) 108 (90 BASE) MCG/ACT inhaler Inhale 2 puffs into the lungs every 6 (six) hours as needed for wheezing.    . diltiazem (CARTIA XT) 120 MG 24 hr capsule Take 1 capsule (120 mg total) by mouth daily. 90 capsule 0  . ferrous sulfate 325 (65 FE) MG tablet Take 325 mg by mouth daily with breakfast.    . lovastatin (MEVACOR) 20 MG tablet Take 20 mg by mouth at bedtime.    . mometasone-formoterol (DULERA) 100-5 MCG/ACT AERO Inhale 2 puffs into the lungs as needed for wheezing or shortness  of breath.     . Multiple Vitamins-Minerals (WOMENS MULTIVITAMIN PLUS PO) Take 1 tablet by mouth daily.    Marland Kitchen oxybutynin (DITROPAN) 5 MG tablet Take 5 mg by mouth daily.     . propafenone (RYTHMOL) 225 MG tablet TAKE 1 TABLET EVERY 8 HOURS 270 tablet 3  . temazepam (RESTORIL) 15 MG capsule Take 15 mg by mouth at bedtime as needed for sleep.     No current facility-administered medications for this visit.     Allergies  Allergen Reactions  . Lisinopril Other (See Comments)    Dizzy and lightheadedness    Review of Systems negative except from HPI and PMH  Physical Exam BP 122/70   Pulse 72   Ht 5\' 5"  (1.651 m)   Wt 151 lb (68.5 kg)   SpO2 96%   BMI 25.13 kg/m  Well  developed and nourished in no acute distress HENT normal Neck supple with JVP-flat Carotids brisk and full without bruits Clear Regular rate and rhythm, no murmurs or gallops Abd-soft with active BS without hepatomegaly No Clubbing cyanosis edema Skin-warm and dry A & Oriented  Grossly normal sensory and motor function   ECG demonstrates atrial pacing at 60 Interval  24/10/41  Assessment and  Plan  Atrial fibrillation-paroxysmal  Pacemaker-[employer]  Grief  Hypertension   Sinus node dysfunction The patient's device was interrogated.  The information was reviewed. No changes were made in the programming.     Good heart rate excursion   No intercurrent atrial fibrillation or flutter  Not on anticoagulation 2/2 AVMs  Grief counseling/sharing  BP well controlled  Continue dilt

## 2017-04-03 DIAGNOSIS — R358 Other polyuria: Secondary | ICD-10-CM | POA: Diagnosis not present

## 2017-04-03 DIAGNOSIS — N390 Urinary tract infection, site not specified: Secondary | ICD-10-CM | POA: Diagnosis not present

## 2017-05-08 DIAGNOSIS — R300 Dysuria: Secondary | ICD-10-CM | POA: Diagnosis not present

## 2017-05-08 DIAGNOSIS — N390 Urinary tract infection, site not specified: Secondary | ICD-10-CM | POA: Diagnosis not present

## 2017-06-01 ENCOUNTER — Other Ambulatory Visit: Payer: Self-pay | Admitting: *Deleted

## 2017-06-01 MED ORDER — PROPAFENONE HCL 225 MG PO TABS: ORAL_TABLET | ORAL | 2 refills | Status: DC

## 2017-06-13 DIAGNOSIS — J441 Chronic obstructive pulmonary disease with (acute) exacerbation: Secondary | ICD-10-CM | POA: Diagnosis not present

## 2017-06-13 DIAGNOSIS — Z6823 Body mass index (BMI) 23.0-23.9, adult: Secondary | ICD-10-CM | POA: Diagnosis not present

## 2017-06-13 DIAGNOSIS — I1310 Hypertensive heart and chronic kidney disease without heart failure, with stage 1 through stage 4 chronic kidney disease, or unspecified chronic kidney disease: Secondary | ICD-10-CM | POA: Diagnosis not present

## 2017-06-13 DIAGNOSIS — I480 Paroxysmal atrial fibrillation: Secondary | ICD-10-CM | POA: Diagnosis not present

## 2017-06-20 DIAGNOSIS — I1310 Hypertensive heart and chronic kidney disease without heart failure, with stage 1 through stage 4 chronic kidney disease, or unspecified chronic kidney disease: Secondary | ICD-10-CM | POA: Diagnosis not present

## 2017-06-20 DIAGNOSIS — Z23 Encounter for immunization: Secondary | ICD-10-CM | POA: Diagnosis not present

## 2017-06-20 DIAGNOSIS — J441 Chronic obstructive pulmonary disease with (acute) exacerbation: Secondary | ICD-10-CM | POA: Diagnosis not present

## 2017-06-20 DIAGNOSIS — J449 Chronic obstructive pulmonary disease, unspecified: Secondary | ICD-10-CM | POA: Diagnosis not present

## 2017-06-20 DIAGNOSIS — Z6823 Body mass index (BMI) 23.0-23.9, adult: Secondary | ICD-10-CM | POA: Diagnosis not present

## 2017-07-14 ENCOUNTER — Telehealth: Payer: Self-pay | Admitting: Internal Medicine

## 2017-07-14 ENCOUNTER — Other Ambulatory Visit: Payer: Self-pay | Admitting: *Deleted

## 2017-07-14 MED ORDER — DILTIAZEM HCL ER COATED BEADS 120 MG PO CP24: 120.0000 mg | ORAL_CAPSULE | Freq: Every day | ORAL | 1 refills | Status: DC

## 2017-07-14 NOTE — Telephone Encounter (Signed)
Walk In Pt Form-Pt Needs Refills on Medications. Placed in [facility].

## 2017-08-22 DIAGNOSIS — E7800 Pure hypercholesterolemia, unspecified: Secondary | ICD-10-CM | POA: Diagnosis not present

## 2017-08-22 DIAGNOSIS — R82998 Other abnormal findings in urine: Secondary | ICD-10-CM | POA: Diagnosis not present

## 2017-08-22 DIAGNOSIS — M859 Disorder of bone density and structure, unspecified: Secondary | ICD-10-CM | POA: Diagnosis not present

## 2017-08-29 DIAGNOSIS — G4709 Other insomnia: Secondary | ICD-10-CM | POA: Diagnosis not present

## 2017-08-29 DIAGNOSIS — Z0000 Encounter for general adult medical examination without abnormal findings: Secondary | ICD-10-CM | POA: Diagnosis not present

## 2017-08-29 DIAGNOSIS — D500 Iron deficiency anemia secondary to blood loss (chronic): Secondary | ICD-10-CM | POA: Diagnosis not present

## 2017-08-29 DIAGNOSIS — N183 Chronic kidney disease, stage 3 (moderate): Secondary | ICD-10-CM | POA: Diagnosis not present

## 2017-08-29 DIAGNOSIS — J449 Chronic obstructive pulmonary disease, unspecified: Secondary | ICD-10-CM | POA: Diagnosis not present

## 2017-08-29 DIAGNOSIS — I480 Paroxysmal atrial fibrillation: Secondary | ICD-10-CM | POA: Diagnosis not present

## 2017-08-29 DIAGNOSIS — Z950 Presence of cardiac pacemaker: Secondary | ICD-10-CM | POA: Diagnosis not present

## 2017-08-29 DIAGNOSIS — N3281 Overactive bladder: Secondary | ICD-10-CM | POA: Diagnosis not present

## 2017-08-29 DIAGNOSIS — L218 Other seborrheic dermatitis: Secondary | ICD-10-CM | POA: Diagnosis not present

## 2017-10-05 ENCOUNTER — Encounter: Payer: Self-pay | Admitting: Neurology

## 2017-10-05 DIAGNOSIS — Z6825 Body mass index (BMI) 25.0-25.9, adult: Secondary | ICD-10-CM | POA: Diagnosis not present

## 2017-10-05 DIAGNOSIS — J441 Chronic obstructive pulmonary disease with (acute) exacerbation: Secondary | ICD-10-CM | POA: Diagnosis not present

## 2017-10-05 DIAGNOSIS — F321 Major depressive disorder, single episode, moderate: Secondary | ICD-10-CM | POA: Diagnosis not present

## 2017-10-05 DIAGNOSIS — R413 Other amnesia: Secondary | ICD-10-CM | POA: Diagnosis not present

## 2017-12-13 ENCOUNTER — Ambulatory Visit: Payer: Medicare HMO | Admitting: Neurology

## 2017-12-19 ENCOUNTER — Other Ambulatory Visit: Payer: Self-pay

## 2017-12-19 ENCOUNTER — Ambulatory Visit: Payer: Medicare HMO | Admitting: Neurology

## 2017-12-19 ENCOUNTER — Encounter: Payer: Self-pay | Admitting: Neurology

## 2017-12-19 VITALS — BP 140/64 | HR 85 | Ht 63.0 in | Wt 153.0 lb

## 2017-12-19 DIAGNOSIS — R413 Other amnesia: Secondary | ICD-10-CM

## 2017-12-19 DIAGNOSIS — F0390 Unspecified dementia without behavioral disturbance: Secondary | ICD-10-CM | POA: Diagnosis not present

## 2017-12-19 DIAGNOSIS — F03A0 Unspecified dementia, mild, without behavioral disturbance, psychotic disturbance, mood disturbance, and anxiety: Secondary | ICD-10-CM

## 2017-12-19 MED ORDER — DONEPEZIL HCL 10 MG PO TABS: ORAL_TABLET | ORAL | 6 refills | Status: DC
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
## 2017-12-19 NOTE — Patient Instructions (Addendum)
1. Schedule head CT without contrast  We have sent a referral to [facility] for your CT and they will call you directly to schedule your appt. They are located at [place]. If you need to contact them directly please call [phone].   2. Start Donepezil (Aricept) 10mg : Take 1/2 tablet daily for 2 weeks, then increase to 1 tablet daily 3. No further driving 4. Agree with higher level of care (assisted living) 5. Follow-up in 6 months, call for any changes  FALL PRECAUTIONS: Be cautious when walking. Scan the area for obstacles that may increase the risk of trips and falls. When getting up in the mornings, sit up at the edge of the bed for a few minutes before getting out of bed. Consider elevating the bed at the head end to avoid drop of blood pressure when getting up. Walk always in a well-lit room (use night lights in the walls). Avoid area rugs or power cords from appliances in the middle of the walkways. Use a walker or a cane if necessary and consider physical therapy for balance exercise. Get your eyesight checked regularly.  FINANCIAL OVERSIGHT: Supervision, especially oversight when making financial decisions or transactions is also recommended.  HOME SAFETY: Consider the safety of the kitchen when operating appliances like stoves, microwave oven, and blender. Consider having supervision and share cooking responsibilities until no longer able to participate in those. Accidents with firearms and other hazards in the house should be identified and addressed as well.  DRIVING: Regarding driving, in patients with progressive memory problems, driving will be impaired. We advise to have someone else do the driving if trouble finding directions or if minor accidents are reported. Independent driving assessment is available to determine safety of driving.  ABILITY TO BE LEFT ALONE: If patient is unable to contact 911 operator, consider using LifeLine, or when the need is there,  arrange for someone to stay with patients. Smoking is a fire hazard, consider supervision or cessation. Risk of wandering should be assessed by caregiver and if detected at any point, supervision and safe proof recommendations should be instituted.  MEDICATION SUPERVISION: Inability to self-administer medication needs to be constantly addressed. Implement a mechanism to ensure safe administration of the medications.  RECOMMENDATIONS FOR ALL PATIENTS WITH MEMORY PROBLEMS: 1. Continue to exercise (Recommend 30 minutes of walking everyday, or 3 hours every week) 2. Increase social interactions - continue going to [place] and enjoy social gatherings with friends and family 3. Eat healthy, avoid fried foods and eat more fruits and vegetables 4. Maintain adequate blood pressure, blood sugar, and blood cholesterol level. Reducing the risk of stroke and cardiovascular disease also helps promoting better memory. 5. Avoid stressful situations. Live a simple life and avoid aggravations. Organize your time and prepare for the next day in anticipation. 6. Sleep well, avoid any interruptions of sleep and avoid any distractions in the bedroom that may interfere with adequate sleep quality 7. Avoid sugar, avoid sweets as there is a strong link between excessive sugar intake, diabetes, and cognitive impairment We discussed the Mediterranean diet, which has been shown to help patients reduce the risk of progressive memory disorders and reduces cardiovascular risk. This includes eating fish, eat fruits and green leafy vegetables, nuts like almonds and hazelnuts, walnuts, and also use olive oil. Avoid fast foods and fried foods as much as possible. Avoid sweets and sugar as sugar use has been linked to worsening of memory function.  There is always a concern  of gradual progression of memory problems. If this is the case, then we may need to adjust level of care according to patient needs. Support, both to the patient and  caregiver, should then be put into place.

## 2017-12-29 DIAGNOSIS — D692 Other nonthrombocytopenic purpura: Secondary | ICD-10-CM | POA: Diagnosis not present

## 2017-12-29 DIAGNOSIS — N183 Chronic kidney disease, stage 3 (moderate): Secondary | ICD-10-CM | POA: Diagnosis not present

## 2017-12-29 DIAGNOSIS — J449 Chronic obstructive pulmonary disease, unspecified: Secondary | ICD-10-CM | POA: Diagnosis not present

## 2017-12-29 DIAGNOSIS — I480 Paroxysmal atrial fibrillation: Secondary | ICD-10-CM | POA: Diagnosis not present

## 2017-12-29 DIAGNOSIS — R413 Other amnesia: Secondary | ICD-10-CM | POA: Diagnosis not present

## 2017-12-29 DIAGNOSIS — E7800 Pure hypercholesterolemia, unspecified: Secondary | ICD-10-CM | POA: Diagnosis not present

## 2017-12-29 DIAGNOSIS — F321 Major depressive disorder, single episode, moderate: Secondary | ICD-10-CM | POA: Diagnosis not present

## 2017-12-29 DIAGNOSIS — I1310 Hypertensive heart and chronic kidney disease without heart failure, with stage 1 through stage 4 chronic kidney disease, or unspecified chronic kidney disease: Secondary | ICD-10-CM | POA: Diagnosis not present

## 2017-12-29 DIAGNOSIS — N390 Urinary tract infection, site not specified: Secondary | ICD-10-CM | POA: Diagnosis not present

## 2018-01-01 ENCOUNTER — Encounter: Payer: Self-pay | Admitting: Neurology

## 2018-01-03 ENCOUNTER — Ambulatory Visit
Admission: RE | Admit: 2018-01-03 | Discharge: 2018-01-03 | Disposition: A | Discharge status: Home / Self Care | Payer: Medicare HMO | Source: Ambulatory Visit | Attending: Neurology | Admitting: Neurology

## 2018-01-03 DIAGNOSIS — S0990XA Unspecified injury of head, initial encounter: Secondary | ICD-10-CM | POA: Diagnosis not present

## 2018-01-03 DIAGNOSIS — R413 Other amnesia: Secondary | ICD-10-CM

## 2018-01-08 DIAGNOSIS — R05 Cough: Secondary | ICD-10-CM | POA: Diagnosis not present

## 2018-01-08 DIAGNOSIS — R0602 Shortness of breath: Secondary | ICD-10-CM | POA: Diagnosis not present

## 2018-01-08 DIAGNOSIS — J441 Chronic obstructive pulmonary disease with (acute) exacerbation: Secondary | ICD-10-CM | POA: Diagnosis not present

## 2018-01-09 ENCOUNTER — Telehealth: Payer: Self-pay

## 2018-01-09 NOTE — Telephone Encounter (Signed)
-----   Message from Cameron Sprang, MD sent at 01/03/2018  2:06 PM EDT ----- Pls let patient/daughter know the head CT did not show any evidence of tumor, stroke, or bleed. It showed age-related changes. Thanks

## 2018-01-09 NOTE — Telephone Encounter (Signed)
Spoke with pt's daughter, Otila Kluver, relaying message below.

## 2018-01-17 DIAGNOSIS — Z950 Presence of cardiac pacemaker: Secondary | ICD-10-CM | POA: Diagnosis not present

## 2018-01-17 DIAGNOSIS — E7800 Pure hypercholesterolemia, unspecified: Secondary | ICD-10-CM | POA: Diagnosis not present

## 2018-01-17 DIAGNOSIS — D509 Iron deficiency anemia, unspecified: Secondary | ICD-10-CM | POA: Diagnosis not present

## 2018-01-17 DIAGNOSIS — I1310 Hypertensive heart and chronic kidney disease without heart failure, with stage 1 through stage 4 chronic kidney disease, or unspecified chronic kidney disease: Secondary | ICD-10-CM | POA: Diagnosis not present

## 2018-01-17 DIAGNOSIS — R413 Other amnesia: Secondary | ICD-10-CM | POA: Diagnosis not present

## 2018-01-17 DIAGNOSIS — N390 Urinary tract infection, site not specified: Secondary | ICD-10-CM | POA: Diagnosis not present

## 2018-01-17 DIAGNOSIS — J441 Chronic obstructive pulmonary disease with (acute) exacerbation: Secondary | ICD-10-CM | POA: Diagnosis not present

## 2018-01-17 DIAGNOSIS — I480 Paroxysmal atrial fibrillation: Secondary | ICD-10-CM | POA: Diagnosis not present

## 2018-01-17 DIAGNOSIS — F321 Major depressive disorder, single episode, moderate: Secondary | ICD-10-CM | POA: Diagnosis not present

## 2018-01-18 DIAGNOSIS — R05 Cough: Secondary | ICD-10-CM | POA: Diagnosis not present

## 2018-01-18 DIAGNOSIS — R0602 Shortness of breath: Secondary | ICD-10-CM | POA: Diagnosis not present

## 2018-01-18 DIAGNOSIS — J441 Chronic obstructive pulmonary disease with (acute) exacerbation: Secondary | ICD-10-CM | POA: Diagnosis not present

## 2018-01-20 ENCOUNTER — Other Ambulatory Visit: Payer: Self-pay | Admitting: Internal Medicine

## 2018-01-28 DIAGNOSIS — R7989 Other specified abnormal findings of blood chemistry: Secondary | ICD-10-CM | POA: Diagnosis not present

## 2018-01-28 DIAGNOSIS — R0602 Shortness of breath: Secondary | ICD-10-CM | POA: Diagnosis not present

## 2018-01-29 ENCOUNTER — Observation Stay (HOSPITAL_COMMUNITY)
Admission: EM | Admit: 2018-01-29 | Discharge: 2018-02-05 | Disposition: A | Discharge status: Home / Self Care | Payer: Medicare HMO | Attending: Interventional Cardiology | Admitting: Interventional Cardiology

## 2018-01-29 ENCOUNTER — Emergency Department (HOSPITAL_COMMUNITY): Payer: Medicare HMO

## 2018-01-29 ENCOUNTER — Encounter (HOSPITAL_COMMUNITY): Payer: Self-pay

## 2018-01-29 ENCOUNTER — Other Ambulatory Visit: Payer: Self-pay

## 2018-01-29 DIAGNOSIS — E785 Hyperlipidemia, unspecified: Secondary | ICD-10-CM | POA: Diagnosis not present

## 2018-01-29 DIAGNOSIS — Z8673 Personal history of transient ischemic attack (TIA), and cerebral infarction without residual deficits: Secondary | ICD-10-CM | POA: Diagnosis not present

## 2018-01-29 DIAGNOSIS — I5021 Acute systolic (congestive) heart failure: Secondary | ICD-10-CM | POA: Diagnosis not present

## 2018-01-29 DIAGNOSIS — I480 Paroxysmal atrial fibrillation: Secondary | ICD-10-CM | POA: Insufficient documentation

## 2018-01-29 DIAGNOSIS — I519 Heart disease, unspecified: Secondary | ICD-10-CM

## 2018-01-29 DIAGNOSIS — I495 Sick sinus syndrome: Secondary | ICD-10-CM | POA: Diagnosis not present

## 2018-01-29 DIAGNOSIS — R0602 Shortness of breath: Secondary | ICD-10-CM | POA: Diagnosis not present

## 2018-01-29 DIAGNOSIS — F0390 Unspecified dementia without behavioral disturbance: Secondary | ICD-10-CM | POA: Diagnosis not present

## 2018-01-29 DIAGNOSIS — R7989 Other specified abnormal findings of blood chemistry: Secondary | ICD-10-CM | POA: Insufficient documentation

## 2018-01-29 DIAGNOSIS — I5022 Chronic systolic (congestive) heart failure: Secondary | ICD-10-CM

## 2018-01-29 DIAGNOSIS — M8580 Other specified disorders of bone density and structure, unspecified site: Secondary | ICD-10-CM | POA: Insufficient documentation

## 2018-01-29 DIAGNOSIS — I110 Hypertensive heart disease with heart failure: Secondary | ICD-10-CM | POA: Diagnosis not present

## 2018-01-29 DIAGNOSIS — R778 Other specified abnormalities of plasma proteins: Secondary | ICD-10-CM

## 2018-01-29 DIAGNOSIS — D649 Anemia, unspecified: Secondary | ICD-10-CM | POA: Diagnosis not present

## 2018-01-29 DIAGNOSIS — I5042 Chronic combined systolic (congestive) and diastolic (congestive) heart failure: Secondary | ICD-10-CM

## 2018-01-29 DIAGNOSIS — R748 Abnormal levels of other serum enzymes: Secondary | ICD-10-CM

## 2018-01-29 DIAGNOSIS — R0609 Other forms of dyspnea: Secondary | ICD-10-CM

## 2018-01-29 DIAGNOSIS — J449 Chronic obstructive pulmonary disease, unspecified: Secondary | ICD-10-CM | POA: Insufficient documentation

## 2018-01-29 DIAGNOSIS — N179 Acute kidney failure, unspecified: Secondary | ICD-10-CM | POA: Diagnosis not present

## 2018-01-29 DIAGNOSIS — R069 Unspecified abnormalities of breathing: Secondary | ICD-10-CM | POA: Diagnosis not present

## 2018-01-29 DIAGNOSIS — Z7982 Long term (current) use of aspirin: Secondary | ICD-10-CM | POA: Diagnosis not present

## 2018-01-29 DIAGNOSIS — Z87891 Personal history of nicotine dependence: Secondary | ICD-10-CM | POA: Insufficient documentation

## 2018-01-29 DIAGNOSIS — Z950 Presence of cardiac pacemaker: Secondary | ICD-10-CM | POA: Diagnosis not present

## 2018-01-29 DIAGNOSIS — I214 Non-ST elevation (NSTEMI) myocardial infarction: Secondary | ICD-10-CM

## 2018-01-29 HISTORY — DX: Unspecified dementia, unspecified severity, without behavioral disturbance, psychotic disturbance, mood disturbance, and anxiety: F03.90

## 2018-01-29 LAB — CBC WITH DIFFERENTIAL/PLATELET
BASOS ABS: 0 10*3/uL (ref 0.0–0.1)
Basophils Relative: 0 %
Eosinophils Absolute: 0.1 10*3/uL (ref 0.0–0.7)
Eosinophils Relative: 2 %
HEMATOCRIT: 32.9 % — AB (ref 36.0–46.0)
Hemoglobin: 9.6 g/dL — ABNORMAL LOW (ref 12.0–15.0)
LYMPHS ABS: 1.3 10*3/uL (ref 0.7–4.0)
LYMPHS PCT: 26 %
MCH: 22.4 pg — AB (ref 26.0–34.0)
MCHC: 29.2 g/dL — ABNORMAL LOW (ref 30.0–36.0)
MCV: 76.7 fL — AB (ref 78.0–100.0)
Monocytes Absolute: 0.8 10*3/uL (ref 0.1–1.0)
Monocytes Relative: 15 %
NEUTROS ABS: 2.8 10*3/uL (ref 1.7–7.7)
Neutrophils Relative %: 57 %
Platelets: 246 10*3/uL (ref 150–400)
RBC: 4.29 MIL/uL (ref 3.87–5.11)
RDW: 18 % — AB (ref 11.5–15.5)
WBC: 5 10*3/uL (ref 4.0–10.5)

## 2018-01-29 LAB — BASIC METABOLIC PANEL
ANION GAP: 6 (ref 5–15)
BUN: 13 mg/dL (ref 6–20)
CHLORIDE: 104 mmol/L (ref 101–111)
CO2: 24 mmol/L (ref 22–32)
Calcium: 9.2 mg/dL (ref 8.9–10.3)
Creatinine, Ser: 0.96 mg/dL (ref 0.44–1.00)
GFR calc Af Amer: >60 mL/min (ref >60)
GFR, EST NON AFRICAN AMERICAN: 52 mL/min — AB (ref >60)
GLUCOSE: 86 mg/dL (ref 65–99)
POTASSIUM: 4.3 mmol/L (ref 3.5–5.1)
Sodium: 134 mmol/L — ABNORMAL LOW (ref 135–145)

## 2018-01-29 LAB — TROPONIN I
Troponin I: 4.23 ng/mL (ref <0.03)
Troponin I: 5.05 ng/mL (ref <0.03)

## 2018-01-29 LAB — BRAIN NATRIURETIC PEPTIDE: B Natriuretic Peptide: 469.5 pg/mL — ABNORMAL HIGH (ref 0.0–100.0)

## 2018-01-29 LAB — I-STAT TROPONIN, ED
Troponin i, poc: 0.16 ng/mL (ref 0.00–0.08)
Troponin i, poc: 1.52 ng/mL (ref 0.00–0.08)

## 2018-01-29 LAB — D-DIMER, QUANTITATIVE (NOT AT ARMC): D DIMER QUANT: 0.62 ug{FEU}/mL — AB (ref 0.00–0.50)

## 2018-01-29 MED ORDER — ASPIRIN 300 MG RE SUPP: 300.0000 mg | RECTAL | Status: AC

## 2018-01-29 MED ORDER — ALBUTEROL SULFATE (2.5 MG/3ML) 0.083% IN NEBU: 2.5000 mg | INHALATION_SOLUTION | Freq: Four times a day (QID) | RESPIRATORY_TRACT | Status: DC | PRN

## 2018-01-29 MED ORDER — IPRATROPIUM-ALBUTEROL 0.5-2.5 (3) MG/3ML IN SOLN
3.0000 mL | Freq: Once | RESPIRATORY_TRACT | Status: AC
  Administered 2018-01-29: 3 mL via RESPIRATORY_TRACT
  Filled 2018-01-29: qty 3

## 2018-01-29 MED ORDER — PROPAFENONE HCL 225 MG PO TABS
225.0000 mg | ORAL_TABLET | Freq: Three times a day (TID) | ORAL | Status: DC
  Administered 2018-01-29 – 2018-02-05 (×21): 225 mg via ORAL
  Filled 2018-01-29 (×24): qty 1

## 2018-01-29 MED ORDER — NITROGLYCERIN 0.4 MG SL SUBL: 0.4000 mg | SUBLINGUAL_TABLET | SUBLINGUAL | Status: DC | PRN

## 2018-01-29 MED ORDER — DILTIAZEM HCL ER COATED BEADS 120 MG PO CP24
120.0000 mg | ORAL_CAPSULE | Freq: Every day | ORAL | Status: DC
  Administered 2018-01-29 – 2018-01-31 (×3): 120 mg via ORAL
  Filled 2018-01-29 (×4): qty 1

## 2018-01-29 MED ORDER — ONDANSETRON HCL 4 MG/2ML IJ SOLN
4.0000 mg | Freq: Four times a day (QID) | INTRAMUSCULAR | Status: DC | PRN
  Administered 2018-02-02: 4 mg via INTRAVENOUS
  Filled 2018-01-29: qty 2

## 2018-01-29 MED ORDER — FUROSEMIDE 10 MG/ML IJ SOLN
20.0000 mg | Freq: Once | INTRAMUSCULAR | Status: AC
  Administered 2018-01-29: 20 mg via INTRAVENOUS
  Filled 2018-01-29: qty 2

## 2018-01-29 MED ORDER — ASPIRIN 81 MG PO CHEW
324.0000 mg | CHEWABLE_TABLET | ORAL | Status: AC
  Administered 2018-01-29: 324 mg via ORAL
  Filled 2018-01-29: qty 4

## 2018-01-29 MED ORDER — DONEPEZIL HCL 10 MG PO TABS
10.0000 mg | ORAL_TABLET | Freq: Every day | ORAL | Status: DC
  Administered 2018-01-29 – 2018-02-04 (×7): 10 mg via ORAL
  Filled 2018-01-29 (×8): qty 1

## 2018-01-29 MED ORDER — ALBUTEROL SULFATE HFA 108 (90 BASE) MCG/ACT IN AERS: 2.0000 | INHALATION_SPRAY | Freq: Four times a day (QID) | RESPIRATORY_TRACT | Status: DC | PRN

## 2018-01-29 MED ORDER — ASPIRIN EC 81 MG PO TBEC
81.0000 mg | DELAYED_RELEASE_TABLET | Freq: Every day | ORAL | Status: DC
  Administered 2018-01-30 – 2018-02-05 (×7): 81 mg via ORAL
  Filled 2018-01-29 (×7): qty 1

## 2018-01-29 MED ORDER — ASPIRIN 81 MG PO CHEW
324.0000 mg | CHEWABLE_TABLET | Freq: Once | ORAL | Status: AC
  Administered 2018-01-29: 324 mg via ORAL
  Filled 2018-01-29: qty 4

## 2018-01-29 MED ORDER — ACETAMINOPHEN 325 MG PO TABS
650.0000 mg | ORAL_TABLET | ORAL | Status: DC | PRN
  Filled 2018-01-29: qty 2

## 2018-01-29 MED ORDER — HEPARIN (PORCINE) IN NACL 100-0.45 UNIT/ML-% IJ SOLN
850.0000 [IU]/h | INTRAMUSCULAR | Status: DC
  Administered 2018-01-29: 800 [IU]/h via INTRAVENOUS
  Filled 2018-01-29 (×2): qty 250

## 2018-01-29 MED ORDER — PREDNISONE 20 MG PO TABS
60.0000 mg | ORAL_TABLET | Freq: Once | ORAL | Status: AC
  Administered 2018-01-29: 60 mg via ORAL
  Filled 2018-01-29: qty 3

## 2018-01-29 MED ORDER — PRAVASTATIN SODIUM 40 MG PO TABS
40.0000 mg | ORAL_TABLET | Freq: Every day | ORAL | Status: DC
  Administered 2018-01-29 – 2018-02-04 (×7): 40 mg via ORAL
  Filled 2018-01-29 (×7): qty 1

## 2018-01-29 NOTE — ED Notes (Signed)
Pt denies sob, chest pain or pain to legs.  Son at bedside.

## 2018-01-29 NOTE — Progress Notes (Signed)
ANTICOAGULATION CONSULT NOTE - Initial Consult  Pharmacy Consult for Heparin Indication: chest pain/ACS  Allergies  Allergen Reactions  . Lisinopril Other (See Comments)    Dizzy and lightheadedness   Patient Measurements: Height: 5\' 3"  (160 cm) Weight: 144 lb (65.3 kg) IBW/kg (Calculated) : 52.4 Heparin Dosing Weight: 65.3 kg  Vital Signs: Temp: 97.5 F (36.4 C) (05/06 0644) Temp Source: Oral (05/06 0644) BP: 150/78 (05/06 1500) Pulse Rate: 105 (05/06 1500)  Labs: Recent Labs    01/29/18 0734  HGB 9.6*  HCT 32.9*  PLT 246  CREATININE 0.96   Estimated Creatinine Clearance: 39 mL/min (by C-G formula based on SCr of 0.96 mg/dL).  Assessment: 3 yoF presenting with SOB, found to have positive troponins. Pharmacy consulted to dose IV heparin for r/o ACS. Note patient has h/o PAF not on AC due to h/o GI bleeds. Hgb 9.6, pltc WNL.   Goal of Therapy:  Heparin level 0.3-0.7 units/ml Monitor platelets by anticoagulation protocol: Yes   Plan:  Will not give heparin bolus due to bleeding history Start heparin infusion at 800 units/hr Check heparin level in 8 hours Daily heparin level and CBC Monitor s/sx of bleeding  Erin N. Gerarda Fraction, PharmD PGY1 Pharmacy Resident Pager: [phone] 01/29/2018,4:02 PM

## 2018-01-29 NOTE — ED Notes (Signed)
[employer] Pacemaker integration completed.

## 2018-01-29 NOTE — Progress Notes (Signed)
Critical troponin 4.23, patient not having ant chest pain. MD notified.

## 2018-01-29 NOTE — ED Provider Notes (Signed)
[facility] Provider Note   CSN: [id number] Arrival date & time: 01/29/18  5035     History   Chief Complaint Chief Complaint  Patient presents with  . Shortness of Breath    HPI Mikayla Harper is a 84 y.o. female.  The history is provided by the patient and medical records. No language interpreter was used.   Mikayla Harper is a 84 y.o. female  with a PMH of sick sinus syndrome s/p pacemaker in place, COPD, HTN, HLD who presents to the Emergency Department complaining of shortness of breath which began this morning. Patient states that she was in her usual state of health yesterday. She awoke this morning and felt as if she couldn't get a full breath.  She feels as if this is somewhat improved compared to earlier, however notes no alleviating factors.  She did try her home inhaler, but this did not provide her any relief. She denies associated symptoms.  No chest pain, nausea, vomiting, diaphoresis, fever, chills, cough, congestion, abdominal pain or back pain.  She denies any leg swelling or calf pain.  No recent surgeries/travel/immobilizations.  She denies any history of similar sensation.   Past Medical History:  Diagnosis Date  . Anemia   . Carpal tunnel syndrome of left wrist 12/20/2010   left hand  . Hyperlipidemia    on Mevacor  . Influenza-like illness 2009   H1N1 PCR negative  . Osteopenia   . Pacemaker - [facility]    Implant Jan 2013  . PAF (paroxysmal atrial fibrillation) ([place])    previously on propafenone  . Sick sinus syndrome ([place])   . TIA (transient ischemic attack) Dec 2012  . Tuberculosis     hx of over 50 years ago "  . Urinary tract infection     Patient Active Problem List   Diagnosis Date Noted  . COPD exacerbation ([place]) 01/07/2013  . Chronic cough 12/04/2012  . Dyspnea on exertion 10/22/2012  . COPD (chronic obstructive pulmonary disease) ([place]) 10/17/2012  . Anemia 08/01/2012  . Benign neoplasm of colon  08/01/2012  . Diverticulosis of colon (without mention of hemorrhage) 08/01/2012  . Insomnia 01/24/2012  . Pacemaker-[facility] 01/24/2012  . Sick sinus syndrome ([place])   . TIA (transient ischemic attack) 10/11/2011  . HTN (hypertension) 10/11/2011  . Atrial fibrillation ([place]) 10/06/2011  . HYPERLIPIDEMIA 10/22/2010    Past Surgical History:  Procedure Laterality Date  . ABDOMINAL HYSTERECTOMY    . BACK SURGERY  1976  . CARDIOVERSION N/A 11/13/2012   Procedure: CARDIOVERSION;  Surgeon: Peter M Martinique, MD;  Location[facility] ENDOSCOPY;  Service: Cardiovascular;  Laterality: N/A;  . COLONOSCOPY  08/01/2012   Procedure: COLONOSCOPY;  Surgeon: Milus Banister, MD;  Location: [facility] ENDOSCOPY;  Service: Endoscopy;  Laterality: N/A;  . ESOPHAGOGASTRODUODENOSCOPY  08/01/2012   Procedure: ESOPHAGOGASTRODUODENOSCOPY (EGD);  Surgeon: Milus Banister, MD;  Location: [facility] ENDOSCOPY;  Service: Endoscopy;  Laterality: N/A;  . INSERT / REPLACE / REMOVE PACEMAKER  10/21/2011  . OTHER SURGICAL HISTORY  In the late 1960's   Hysterectomy secondary to fibroids  . OTHER SURGICAL HISTORY     cataract surgery  . PERMANENT PACEMAKER INSERTION N/A 10/21/2011   Procedure: PERMANENT PACEMAKER INSERTION;  Surgeon: Deboraha Sprang, MD;  Location: [facility] CATH LAB;  Service: Cardiovascular;  Laterality: N/A;     OB History   None      Home Medications    Prior to Admission medications  Medication Sig Start Date End Date Taking? Authorizing Provider  albuterol (PROVENTIL HFA;VENTOLIN HFA) 108 (90 BASE) MCG/ACT inhaler Inhale 2 puffs into the lungs every 6 (six) hours as needed for wheezing.   Yes [provider]  diltiazem (CARTIA XT) 120 MG 24 hr capsule Take 1 capsule (120 mg total) by mouth daily. Patient needs to call and schedule an appointment for further refills 1st attempt 01/23/18  Yes Deboraha Sprang, MD  donepezil (ARICEPT) 10 MG tablet Take 1 tablet daily 12/19/17  Yes Cameron Sprang, MD  guaiFENesin  (MUCINEX) 600 MG 12 hr tablet Take 600 mg by mouth 2 (two) times daily as needed for cough.   Yes [provider]  ipratropium-albuterol (DUONEB) 0.5-2.5 (3) MG/3ML SOLN Take 3 mLs by nebulization 4 (four) times daily. 01/27/18  Yes [provider]  lovastatin (MEVACOR) 20 MG tablet Take 20 mg by mouth at bedtime.   Yes [provider]  Multiple Vitamins-Minerals (WOMENS MULTIVITAMIN PLUS PO) Take 1 tablet by mouth daily.   Yes [provider]  oxybutynin (DITROPAN) 5 MG tablet Take 5 mg by mouth daily.  10/07/11  Yes [provider]  propafenone (RYTHMOL) 225 MG tablet TAKE 1 TABLET BY MOUTH EVERY 8 HOURS 06/01/17  Yes Deboraha Sprang, MD    Family History Family History  Problem Relation Age of Onset  . Dementia Mother   . Emphysema Father   . Heart disease Father   . Lymphoma Daughter        in bone    Social History Social History   Tobacco Use  . Smoking status: Former Smoker    Packs/day: 1.00    Years: 30.00    Pack years: 30.00    Types: Cigarettes    Last attempt to quit: 10/05/1988    Years since quitting: 29.3  . Smokeless tobacco: Never Used  Substance Use Topics  . Alcohol use: No  . Drug use: No     Allergies   Lisinopril   Review of Systems Review of Systems  Respiratory: Positive for shortness of breath. Negative for cough and wheezing.   All other systems reviewed and are negative.    Physical Exam Updated Vital Signs BP (!) 152/77   Pulse 85   Temp (!) 97.5 F (36.4 C) (Oral)   Resp (!) 24   SpO2 97%   Physical Exam  Constitutional: She is oriented to person, place, and time. She appears well-developed and well-nourished. No distress.  HENT:  Head: Normocephalic and atraumatic.  Cardiovascular: Normal rate, regular rhythm and normal heart sounds.  No murmur heard. Pulmonary/Chest: No respiratory distress.  Tachypneic.  Lungs clear to auscultation bilaterally.  Abdominal: Soft. She exhibits no  distension. There is no tenderness.  Musculoskeletal:  No lower extremity edema or calf tenderness.  Neurological: She is alert and oriented to person, place, and time.  Skin: Skin is warm and dry.  Nursing note and vitals reviewed.    ED Treatments / Results  Labs (all labs ordered are listed, but only abnormal results are displayed) Labs Reviewed  BASIC METABOLIC PANEL - Abnormal; Notable for the following components:      Result Value   Sodium 134 (*)    GFR calc non Af Amer 52 (*)    All other components within normal limits  CBC WITH DIFFERENTIAL/PLATELET - Abnormal; Notable for the following components:   Hemoglobin 9.6 (*)    HCT 32.9 (*)    MCV 76.7 (*)  MCH 22.4 (*)    MCHC 29.2 (*)    RDW 18.0 (*)    All other components within normal limits  D-DIMER, QUANTITATIVE (NOT AT [facility]) - Abnormal; Notable for the following components:   D-Dimer, Quant 0.62 (*)    All other components within normal limits  I-STAT TROPONIN, ED - Abnormal; Notable for the following components:   Troponin i, poc 0.16 (*)    All other components within normal limits  I-STAT TROPONIN, ED - Abnormal; Notable for the following components:   Troponin i, poc 1.52 (*)    All other components within normal limits    EKG EKG Interpretation  Date/Time:  Monday Jan 29 2018 06:48:01 EDT Ventricular Rate:  71 PR Interval:  164 QRS Duration: 78 QT Interval:  396 QTC Calculation: 430 R Axis:   -32 Text Interpretation:  Normal sinus rhythm Left axis deviation Nonspecific ST abnormality Abnormal ECG since last tracing no significant change Confirmed by Daleen Bo [phone]) on 01/29/2018 7:04:22 AM   Radiology Dg Chest 2 View  Result Date: 01/29/2018 CLINICAL DATA:  84 year old female with shortness of breath which woke her from sleep. EXAM: CHEST - 2 VIEW COMPARISON:  05/18/2016 chest radiographs and earlier. FINDINGS: Upright AP and lateral views of the chest. Chronic large right upper lung  granuloma is unchanged since 2009. There are smaller adjacent calcified granulomas. Chronic pulmonary hyperinflation with centrilobular emphysema demonstrated by CT in 2009. Mildly lower lung volumes today. No pneumothorax, pulmonary edema, pleural effusion or confluent pulmonary opacity. Stable left chest dual lead cardiac pacemaker. Stable cardiac size and mediastinal contours. Mildly tortuous thoracic aorta. No acute osseous abnormality identified. Negative visible bowel gas pattern. IMPRESSION: 1.  No acute cardiopulmonary abnormality. 2. Chronic Emphysema (ICD10-J43.9). Electronically Signed   By: Genevie Ann M.D.   On: 01/29/2018 07:31    Procedures Procedures (including critical care time)  Medications Ordered in ED Medications  ipratropium-albuterol (DUONEB) 0.5-2.5 (3) MG/3ML nebulizer solution 3 mL (3 mLs Nebulization Given 01/29/18 0815)  aspirin chewable tablet 324 mg (324 mg Oral Given 01/29/18 0810)  predniSONE (DELTASONE) tablet 60 mg (60 mg Oral Given 01/29/18 0845)     Initial Impression / Assessment and Plan / ED Course  I have reviewed the triage vital signs and the nursing notes.  Pertinent labs & imaging results that were available during my care of the patient were reviewed by me and considered in my medical decision making (see chart for details).    Mikayla Harper is a 84 y.o. female who presents to ED for shortness of breath which began this morning.  On exam, patient is afebrile, hemodynamically stable with clear lung exam.  She is tachypneic. Troponin of 0.16. EKG reviewed with no significant change from previous tracing. D-dimer after age adjustment normal. CXR without acute findings. 2nd troponin obtained with significant bump to 1.52. Cardiology consulted who will admit.   Patient seen by and discussed with Dr. Eulis Foster who agrees with treatment plan.   Final Clinical Impressions(s) / ED Diagnoses   Final diagnoses:  Elevated troponin  SOB (shortness of breath)    ED  Discharge Orders    None       Ward, Ozella Almond, PA-C 01/29/18 1352    Daleen Bo, MD 01/29/18 1525

## 2018-01-29 NOTE — ED Notes (Signed)
Patient transported to X-ray 

## 2018-01-29 NOTE — ED Triage Notes (Signed)
Pt comes from home via [facility] for SOB that woke her up from her sleep, hx of COPD, lung sounds clear, denies fevers, no distress noted, unrelieved by home inhalers

## 2018-01-29 NOTE — ED Notes (Signed)
Dr.Wentz and Roselyn Reef Ward PA made aware of pt I-stat troponin level. ED-Lab.

## 2018-01-29 NOTE — H&P (Addendum)
Cardiology H&P    Patient ID: Mikayla Harper MRN: [id number], DOB/AGE: 1929-11-17   Admit date: 01/29/2018 Date of Consult: 01/29/2018  Primary Physician: Haywood Pao, MD Primary Cardiologist: Dr. Caryl Comes  Requesting Provider: Dr. Eulis Foster Reason for Consultation: + trop, shortness of breath  Mikayla Harper is a 84 y.o. female who is being seen today for the evaluation of + trop, and shortness of breath at the request of Dr. Eulis Foster.   Patient Profile    84 yo female with PMH of PAF, tachy/brady syndrome s/p PPM, HL, TIA, anemia, COPD and dementia who presented with shortness of breath. Found to have a positive troponin.   Past Medical History   Past Medical History:  Diagnosis Date  . Anemia   . Carpal tunnel syndrome of left wrist 12/20/2010   left hand  . Hyperlipidemia    on Mevacor  . Influenza-like illness 2009   H1N1 PCR negative  . Osteopenia   . Pacemaker - [facility]    Implant Jan 2013  . PAF (paroxysmal atrial fibrillation) ([place])    previously on propafenone  . Sick sinus syndrome ([place])   . TIA (transient ischemic attack) Dec 2012  . Tuberculosis     hx of over 50 years ago "  . Urinary tract infection     Past Surgical History:  Procedure Laterality Date  . ABDOMINAL HYSTERECTOMY    . BACK SURGERY  1976  . CARDIOVERSION N/A 11/13/2012   Procedure: CARDIOVERSION;  Surgeon: Peter M Martinique, MD;  Location[facility] ENDOSCOPY;  Service: Cardiovascular;  Laterality: N/A;  . COLONOSCOPY  08/01/2012   Procedure: COLONOSCOPY;  Surgeon: Milus Banister, MD;  Location: [facility] ENDOSCOPY;  Service: Endoscopy;  Laterality: N/A;  . ESOPHAGOGASTRODUODENOSCOPY  08/01/2012   Procedure: ESOPHAGOGASTRODUODENOSCOPY (EGD);  Surgeon: Milus Banister, MD;  Location: [facility] ENDOSCOPY;  Service: Endoscopy;  Laterality: N/A;  . INSERT / REPLACE / REMOVE PACEMAKER  10/21/2011  . OTHER SURGICAL HISTORY  In the late 1960's   Hysterectomy secondary to fibroids  . OTHER SURGICAL HISTORY     cataract  surgery  . PERMANENT PACEMAKER INSERTION N/A 10/21/2011   Procedure: PERMANENT PACEMAKER INSERTION;  Surgeon: Deboraha Sprang, MD;  Location: [facility] CATH LAB;  Service: Cardiovascular;  Laterality: N/A;     Allergies  Allergies  Allergen Reactions  . Lisinopril Other (See Comments)    Dizzy and lightheadedness    History of Present Illness    Mikayla Harper is an 84 yo female with PMH of PAF, tachy/brady syndrome s/p PPM, HL, TIA, anemia, COPD and dementia. She is followed by Dr. Caryl Comes as an outpatient. Denies any hx of cardiac work up in the past with stress testing, or cardiac cath. PPM was placed back in 1/13, and noted that she had recurrent GI bleeds 2/2 to AVMs therefore Nikolski was stopped. She has been on propafenone since 2003. Last echo 1/13 with normal EF and normal LA size with diastolic dysfunction. She was last seen in the office on 5/18 with Dr. Caryl Comes and device was noted to be functioning normally with no programming changes. No recurrent Afib or Aflutter.   She currently lives alone but family is next door. She reports being independent with her ADLs, but family at the bedside reports otherwise. Stating they help her with a majority of her day to day needs. The patient reports she has been in her usual state of health until this morning when she was awoken from sleep around  5am with sudden onset of shortness of breath. No chest pain. Eventually called EMS.   In the ED her labs showed Trop 0.16>>1.52, Na+ 134, Hgb 9.6, Cr 0.96. EKG showed SR. CXR was negative for edema. She was given a neb treatment, along with prednisone. Also 324mg  ASA.   Inpatient Medications      Family History    Family History  Problem Relation Age of Onset  . Dementia Mother   . Emphysema Father   . Heart disease Father   . Lymphoma Daughter        in bone    Social History    Social History   Socioeconomic History  . Marital status: Married    Spouse name: Not on file  . Number of children: 2    . Years of education: Not on file  . Highest education level: Not on file  Occupational History  . Occupation: retired2    Fish farm manager: RETIRED  Social Needs  . Financial resource strain: Not on file  . Food insecurity:    Worry: Not on file    Inability: Not on file  . Transportation needs:    Medical: Not on file    Non-medical: Not on file  Tobacco Use  . Smoking status: Former Smoker    Packs/day: 1.00    Years: 30.00    Pack years: 30.00    Types: Cigarettes    Last attempt to quit: 10/05/1988    Years since quitting: 29.3  . Smokeless tobacco: Never Used  Substance and Sexual Activity  . Alcohol use: No  . Drug use: No  . Sexual activity: Never  Lifestyle  . Physical activity:    Days per week: Not on file    Minutes per session: Not on file  . Stress: Not on file  Relationships  . Social connections:    Talks on phone: Not on file    Gets together: Not on file    Attends religious service: Not on file    Active member of club or organization: Not on file    Attends meetings of clubs or organizations: Not on file    Relationship status: Not on file  . Intimate partner violence:    Fear of current or ex partner: Not on file    Emotionally abused: Not on file    Physically abused: Not on file    Forced sexual activity: Not on file  Other Topics Concern  . Not on file  Social History Narrative   Pt lives alone in 1 story home   Had 3 children / 2 are now deceased   12-20-2022 grade education   Retired.      Review of Systems    See HPI  All other systems reviewed and are otherwise negative except as noted above.  Physical Exam    Blood pressure (!) 159/72, pulse 86, temperature (!) 97.5 F (36.4 C), temperature source Oral, resp. rate (!) 30, SpO2 97 %.  General: Pleasant, older WF, NAD Psych: Normal affect. Neuro: Alert and oriented X 3. Moves all extremities spontaneously. HEENT: Normal  Neck: Supple without bruits or JVD. Lungs:  Resp regular and  unlabored, CTA. Heart: RRR no s3, s4, or murmurs. Abdomen: Soft, non-tender, non-distended, BS + x 4.  Extremities: No clubbing, cyanosis or edema. DP/PT/Radials 2+ and equal bilaterally.  Labs    Troponin [facility] I of Care Test) Recent Labs    01/29/18 1128  TROPIPOC 1.52*   No results for  input(s): CKTOTAL, CKMB, TROPONINI in the last 72 hours. Lab Results  Component Value Date   WBC 5.0 01/29/2018   HGB 9.6 (L) 01/29/2018   HCT 32.9 (L) 01/29/2018   MCV 76.7 (L) 01/29/2018   PLT 246 01/29/2018    Recent Labs  Lab 01/29/18 0734  NA 134*  K 4.3  CL 104  CO2 24  BUN 13  CREATININE 0.96  CALCIUM 9.2  GLUCOSE 86   Lab Results  Component Value Date   CHOL 195 10/11/2011   HDL 74.50 10/11/2011   LDLCALC 102 (H) 10/11/2011   TRIG 94.0 10/11/2011   Lab Results  Component Value Date   DDIMER 0.62 (H) 01/29/2018     Radiology Studies    Dg Chest 2 View  Result Date: 01/29/2018 CLINICAL DATA:  84 year old female with shortness of breath which woke her from sleep. EXAM: CHEST - 2 VIEW COMPARISON:  05/18/2016 chest radiographs and earlier. FINDINGS: Upright AP and lateral views of the chest. Chronic large right upper lung granuloma is unchanged since 2009. There are smaller adjacent calcified granulomas. Chronic pulmonary hyperinflation with centrilobular emphysema demonstrated by CT in 2009. Mildly lower lung volumes today. No pneumothorax, pulmonary edema, pleural effusion or confluent pulmonary opacity. Stable left chest dual lead cardiac pacemaker. Stable cardiac size and mediastinal contours. Mildly tortuous thoracic aorta. No acute osseous abnormality identified. Negative visible bowel gas pattern. IMPRESSION: 1.  No acute cardiopulmonary abnormality. 2. Chronic Emphysema (ICD10-J43.9). Electronically Signed   By: Genevie Ann M.D.   On: 01/29/2018 07:31   Ct Head Wo Contrast  Result Date: 01/03/2018 CLINICAL DATA:  84 y/o F; 6 months of memory loss. 12/31/2017 fall with head  injury. EXAM: CT HEAD WITHOUT CONTRAST TECHNIQUE: Contiguous axial images were obtained from the base of the skull through the vertex without intravenous contrast. COMPARISON:  10/11/2011 CT head. FINDINGS: Brain: No evidence of acute infarction, hemorrhage, hydrocephalus, extra-axial collection or mass lesion/mass effect. Mild progression of chronic microvascular ischemic changes and parenchymal volume loss of the brain. Vascular: Calcific atherosclerosis of carotid siphons. Skull: Normal. Negative for fracture or focal lesion. Sinuses/Orbits: No acute finding. Other: Bilateral intra-ocular lens replacement. IMPRESSION: 1. No acute intracranial abnormality or calvarial fracture. 2. Mild progression of chronic microvascular ischemic changes and parenchymal volume loss of the brain 2013. Electronically Signed   By: Kristine Garbe M.D.   On: 01/03/2018 13:47    ECG & Cardiac Imaging    EKG:  The EKG was personally reviewed and demonstrates SR  Assessment & Plan    84 yo female with PMH of PAF, tachy/brady syndrome s/p PPM, HL, TIA, anemia, COPD and dementia who presented with shortness of breath. Found to have a positive troponin.   1. Shortness of breath: Reports being awoken around 5am with sudden onset prompting her to present to the ED. Does have a hx of COPD and family reports she does not use her inhalers or nebs at home. Given treatment in the ED along with prednisone. CXR was neg for edema.  -- trial dose of lasix 20mg  IV x1 -- check BNP  2. Elevated troponin: 0.16>>1.52. EKG nonacute. She denies any chest pain prior to admission. At this time, given her age and dementia will treat conservatively. -- IV heparin for now -- cycle troponins -- echo for WMA  3. Tachy/brady syndrome s/p PPM: Interrogation done in the ED, but report is pending.   4. PAF: has been on propafenone and Dilt without recurrence. No OAC given  hx of GI bleeds.    5. Anemia: Hx of the same. Follow CBC with  IV heparin  Signed, Reino Bellis, NP-C Pager [phone] 01/29/2018, 12:56 PM  I have examined the patient and reviewed assessment and plan and discussed with patient.  Agree with above as stated.  She appears uncomfortable in the bed. Unclear etiology of SHOB.  Positive troponin, but given her overall frailty, will plan on medical therapy.  D/w daughter who lives next door.  She is in agreement with the plan for medical therapy, and not pursuing cath.    Not a good candidate for anticoagulation.  No RF for PE and she is 100% on room air.  No swelling.      Larae Grooms

## 2018-01-29 NOTE — ED Provider Notes (Signed)
  Face-to-face evaluation   History: She presents for evaluation of shortness of breath which she states started this morning.  She has been using her usual medications which include inhalers.  Denies cough, or chest pain.  Physical exam: Elderly alert patient who appears ill.  Lungs with decreased air movement bilaterally but no audible wheezes rales or rhonchi.  Heart regular rate and rhythm without murmur.  Medical screening examination/treatment/procedure(s) were conducted as a shared visit with non-physician practitioner(s) and myself.  I personally evaluated the patient during the encounter    Daleen Bo, MD 01/29/18 1525

## 2018-01-30 ENCOUNTER — Observation Stay (HOSPITAL_BASED_OUTPATIENT_CLINIC_OR_DEPARTMENT_OTHER): Payer: Medicare HMO

## 2018-01-30 DIAGNOSIS — J449 Chronic obstructive pulmonary disease, unspecified: Secondary | ICD-10-CM | POA: Diagnosis not present

## 2018-01-30 DIAGNOSIS — R748 Abnormal levels of other serum enzymes: Secondary | ICD-10-CM | POA: Diagnosis not present

## 2018-01-30 DIAGNOSIS — R0602 Shortness of breath: Secondary | ICD-10-CM | POA: Diagnosis not present

## 2018-01-30 DIAGNOSIS — I351 Nonrheumatic aortic (valve) insufficiency: Secondary | ICD-10-CM

## 2018-01-30 DIAGNOSIS — Z950 Presence of cardiac pacemaker: Secondary | ICD-10-CM | POA: Diagnosis not present

## 2018-01-30 DIAGNOSIS — I5021 Acute systolic (congestive) heart failure: Secondary | ICD-10-CM | POA: Diagnosis not present

## 2018-01-30 LAB — BASIC METABOLIC PANEL
ANION GAP: 9 (ref 5–15)
BUN: 18 mg/dL (ref 6–20)
CHLORIDE: 100 mmol/L — AB (ref 101–111)
CO2: 25 mmol/L (ref 22–32)
CREATININE: 1.11 mg/dL — AB (ref 0.44–1.00)
Calcium: 9.2 mg/dL (ref 8.9–10.3)
GFR calc non Af Amer: 44 mL/min — ABNORMAL LOW (ref >60)
GFR, EST AFRICAN AMERICAN: 51 mL/min — AB (ref >60)
Glucose, Bld: 104 mg/dL — ABNORMAL HIGH (ref 65–99)
POTASSIUM: 4.1 mmol/L (ref 3.5–5.1)
Sodium: 134 mmol/L — ABNORMAL LOW (ref 135–145)

## 2018-01-30 LAB — LIPID PANEL
CHOLESTEROL: 204 mg/dL — AB (ref 0–200)
HDL: 87 mg/dL (ref >40)
LDL CALC: 106 mg/dL — AB (ref 0–99)
TRIGLYCERIDES: 57 mg/dL (ref <150)
Total CHOL/HDL Ratio: 2.3 RATIO
VLDL: 11 mg/dL (ref 0–40)

## 2018-01-30 LAB — CBC
HCT: 30.6 % — ABNORMAL LOW (ref 36.0–46.0)
HEMOGLOBIN: 9.1 g/dL — AB (ref 12.0–15.0)
MCH: 22.4 pg — AB (ref 26.0–34.0)
MCHC: 29.7 g/dL — AB (ref 30.0–36.0)
MCV: 75.2 fL — ABNORMAL LOW (ref 78.0–100.0)
Platelets: 270 10*3/uL (ref 150–400)
RBC: 4.07 MIL/uL (ref 3.87–5.11)
RDW: 17.5 % — AB (ref 11.5–15.5)
WBC: 5.7 10*3/uL (ref 4.0–10.5)

## 2018-01-30 LAB — TROPONIN I: TROPONIN I: 4.11 ng/mL — AB (ref <0.03)

## 2018-01-30 LAB — ECHOCARDIOGRAM COMPLETE
HEIGHTINCHES: 63 in
WEIGHTICAEL: 2280 [oz_av]

## 2018-01-30 LAB — HEPARIN LEVEL (UNFRACTIONATED)
HEPARIN UNFRACTIONATED: 0.45 [IU]/mL (ref 0.30–0.70)
Heparin Unfractionated: 0.29 IU/mL — ABNORMAL LOW (ref 0.30–0.70)
Heparin Unfractionated: 0.57 IU/mL (ref 0.30–0.70)

## 2018-01-30 MED ORDER — TRAZODONE HCL 50 MG PO TABS
50.0000 mg | ORAL_TABLET | Freq: Every evening | ORAL | Status: DC | PRN
  Administered 2018-01-30 – 2018-02-04 (×4): 50 mg via ORAL
  Filled 2018-01-30 (×4): qty 1

## 2018-01-30 MED ORDER — FUROSEMIDE 20 MG PO TABS
20.0000 mg | ORAL_TABLET | Freq: Every day | ORAL | Status: DC
  Administered 2018-01-30 – 2018-02-01 (×3): 20 mg via ORAL
  Filled 2018-01-30 (×3): qty 1

## 2018-01-30 MED ORDER — PERFLUTREN LIPID MICROSPHERE
1.0000 mL | INTRAVENOUS | Status: AC | PRN
Start: 2018-01-30 — End: 2018-01-30
  Administered 2018-01-30: 2 mL via INTRAVENOUS
  Filled 2018-01-30: qty 10

## 2018-01-30 NOTE — Consult Note (Signed)
   [facility] CM Inpatient Consult   01/30/2018  Mikayla Harper [birth year] [id number]  Patient screened for potential [place] Management services. Patient is in the [place] of the [place] Management services under patient's [facility] plan.  Met with the patient to assess for needs.  Patient states she is feeling much better and not as short of breath.  Patient did answer HIPAA questions appropriately but did ask, "Exactly where am I."  Explained that she is at [place] and she is on the Heart Failure unit.  She states, "Ok."  She states that she drives to her appointments.  She cooks and takes care of herself.  She states she lives alone but has good family support with son living in the area and her daughter-in-law is a Marine scientist.  Shared the information regarding [facility] Care Management.  She endorses Dr. Domenick Gong as her primary care provider.  She denies any needs was given a brochure, 24 hour nurse advise line and contact information.  For questions contact:   Natividad Brood, RN BSN [facility] Liaison  [phone] business mobile phone Toll free office [phone]

## 2018-01-30 NOTE — Progress Notes (Addendum)
Progress Note  Patient Name: Mikayla Harper Date of Encounter: 01/30/2018  Primary Cardiologist: No primary care provider on file.  Subjective   Feeling much better this morning. Asking to go home.   Inpatient Medications    Scheduled Meds: . aspirin EC  81 mg Oral Daily  . diltiazem  120 mg Oral Daily  . donepezil  10 mg Oral QHS  . pravastatin  40 mg Oral q1800  . propafenone  225 mg Oral Q8H   Continuous Infusions: . heparin 850 Units/hr (01/30/18 0142)   PRN Meds: acetaminophen, albuterol, nitroGLYCERIN, ondansetron (ZOFRAN) IV, perflutren lipid microspheres (DEFINITY) IV suspension   Vital Signs    Vitals:   01/29/18 2056 01/30/18 0043 01/30/18 0448 01/30/18 0759  BP: 121/87 120/72 (!) 112/59 127/67  Pulse: 100 88 85 71  Resp: (!) 28 18 18 18   Temp: 98 F (36.7 C)  (!) 97.5 F (36.4 C) 97.6 F (36.4 C)  TempSrc: Oral  Oral Oral  SpO2: 98% 96% 98% 96%  Weight:   142 lb 8 oz (64.6 kg)   Height:        Intake/Output Summary (Last 24 hours) at 01/30/2018 1046 Last data filed at 01/30/2018 9528 Gross per 24 hour  Intake 596.67 ml  Output 650 ml  Net -53.33 ml   Filed Weights   01/29/18 1453 01/30/18 0448  Weight: 144 lb (65.3 kg) 142 lb 8 oz (64.6 kg)    Telemetry    SR - Personally Reviewed  ECG    N/a - Personally Reviewed  Physical Exam   General: Well developed, well nourished, older W female appearing in no acute distress. Head: Normocephalic, atraumatic.  Neck: Supple, no JVD. Lungs:  Resp regular and unlabored, CTA. Heart: RRR, S1, S2, no S3, S4, or murmur; no rub. Abdomen: Soft, non-tender, non-distended with normoactive bowel sounds.  Extremities: No clubbing, cyanosis, edema. Distal pedal pulses are 2+ bilaterally. Neuro: Alert and oriented X 3. Moves all extremities spontaneously. Psych: Normal affect.  Labs    Chemistry Recent Labs  Lab 01/29/18 0734 01/30/18 0409  NA 134* 134*  K 4.3 4.1  CL 104 100*  CO2 24 25  GLUCOSE  86 104*  BUN 13 18  CREATININE 0.96 1.11*  CALCIUM 9.2 9.2  GFRNONAA 52* 44*  GFRAA >60 51*  ANIONGAP 6 9     Hematology Recent Labs  Lab 01/29/18 0734 01/30/18 0409  WBC 5.0 5.7  RBC 4.29 4.07  HGB 9.6* 9.1*  HCT 32.9* 30.6*  MCV 76.7* 75.2*  MCH 22.4* 22.4*  MCHC 29.2* 29.7*  RDW 18.0* 17.5*  PLT 246 270    Cardiac Enzymes Recent Labs  Lab 01/29/18 2005 01/29/18 2124 01/30/18 0409  TROPONINI 4.23* 5.05* 4.11*    Recent Labs  Lab 01/29/18 0743 01/29/18 1128  TROPIPOC 0.16* 1.52*     BNP Recent Labs  Lab 01/29/18 2005  BNP 469.5*     DDimer  Recent Labs  Lab 01/29/18 0734  DDIMER 0.62*      Radiology    Dg Chest 2 View  Result Date: 01/29/2018 CLINICAL DATA:  84 year old female with shortness of breath which woke her from sleep. EXAM: CHEST - 2 VIEW COMPARISON:  05/18/2016 chest radiographs and earlier. FINDINGS: Upright AP and lateral views of the chest. Chronic large right upper lung granuloma is unchanged since 2009. There are smaller adjacent calcified granulomas. Chronic pulmonary hyperinflation with centrilobular emphysema demonstrated by CT in 2009. Mildly lower lung volumes  today. No pneumothorax, pulmonary edema, pleural effusion or confluent pulmonary opacity. Stable left chest dual lead cardiac pacemaker. Stable cardiac size and mediastinal contours. Mildly tortuous thoracic aorta. No acute osseous abnormality identified. Negative visible bowel gas pattern. IMPRESSION: 1.  No acute cardiopulmonary abnormality. 2. Chronic Emphysema (ICD10-J43.9). Electronically Signed   By: Genevie Ann M.D.   On: 01/29/2018 07:31    Cardiac Studies   TTE: pending.   Patient Profile     84 y.o. female with PMH of PAF, tachy/brady syndrome s/p PPM, HL, TIA, anemia, COPD and dementia who presented with shortness of breath. Found to have a positive troponin.   Assessment & Plan    1. Shortness of breath: Reports being awoken around 5am the morning of admission  with sudden onset prompting her to present to the ED. Does have a hx of COPD and family reports she does not use her inhalers or nebs at home. BNP was elevated at 469. Given dose of 20 IV lasix and only 650cc UOP but feels much better this morning.   2. Elevated troponin: Peaked at 5.05. EKG nonacute on admission. She denies any chest pain prior or throughout admission.. At this time, given her age and dementia will treat conservatively.  -- IV heparin -- echo completed but read is pending.   3. Tachy/brady syndrome s/p PPM: SR on tele  4. PAF: has been on propafenone and Dilt without recurrence. No OAC given hx of GI bleeds.    5. Anemia: Hx of the same. Hgb 9.6>>9.1. Follow CBC with IV heparin  Signed, Reino Bellis, NP  01/30/2018, 10:46 AM  Pager # [phone]   I have examined the patient and reviewed assessment and plan and discussed with patient.  Agree with above as stated.  No chest pain.  She is unsure where she went earlier today or what test she had.  She had an echo which is pending.  SHe felt better after a mild diuresis.  Breathing more comfortably today.  Mild anemia.  She has received heparin overnight.  Troponin has peaked.  Will check for RWMA on the echo and treat appropriately.  SHe may need a long term diuretic if EF is significantly decreased.  Will stop IV heparin at this time given the anemia and no plans for invasive testing in the setting of dementia.    Larae Grooms   For questions or updates, please contact [facility] Please consult [email or web address] for contact info under Cardiology/STEMI.

## 2018-01-30 NOTE — Progress Notes (Signed)
Pt daughter is at bedside and requests to speak to MD, paged NP

## 2018-01-30 NOTE — Progress Notes (Signed)
ANTICOAGULATION CONSULT NOTE  Pharmacy Consult for Heparin Indication: chest pain/ACS   Labs: Recent Labs    01/29/18 0734 01/29/18 2005 01/29/18 2124 01/30/18 0058 01/30/18 0409 01/30/18 0617  HGB 9.6*  --   --   --  9.1*  --   HCT 32.9*  --   --   --  30.6*  --   PLT 246  --   --   --  270  --   HEPARINUNFRC  --   --   --  0.29*  --  0.45  CREATININE 0.96  --   --   --  1.11*  --   TROPONINI  --  4.23* 5.05*  --  4.11*  --    Estimated Creatinine Clearance: 33.5 mL/min (A) (by C-G formula based on SCr of 1.11 mg/dL (H)).  Assessment: 22 yoF presenting with SOB, found to have positive troponins. Pharmacy consulted to dose IV heparin for r/o ACS. Note patient has h/o PAF not on AC due to h/o GI bleeds.  Initial heparin level 8 hour was 0.29 units/ml, thus heparin rate was increased to 850 units/hr. Now ~5 hr heparin level 0.45, therapeutic on 850 units/hr.  Hgb stable at 9.1, pltc wnl No bleeding noted. Echo performed today  Goal of Therapy:  Heparin level 0.3-0.7 units/ml Monitor platelets by anticoagulation protocol: Yes   Plan:  Continue heparin infusion 850 units/hr Check heparin level in ~6 hours Daily heparin level and CBC Monitor s/sx of bleeding  Thanks for allowing pharmacy to be a part of this patient's care. Nicole Cella, RPh Clinical Pharmacist Pager: [phone] 8A-4P [phone] 4P-10P [phone] [place] [phone]   01/30/2018,10:07 AM

## 2018-01-30 NOTE — Progress Notes (Signed)
  Echocardiogram 2D Echocardiogram has been performed.  Mikayla Harper L Androw 01/30/2018, 9:34 AM

## 2018-01-30 NOTE — Plan of Care (Signed)
  Problem: Education: Goal: Knowledge of General Education information will improve Outcome: Progressing   Problem: Health Behavior/Discharge Planning: Goal: Ability to manage health-related needs will improve Outcome: Progressing   Problem: Clinical Measurements: Goal: Respiratory complications will improve Outcome: Progressing   Problem: Clinical Measurements: Goal: Diagnostic test results will improve Outcome: Progressing   Problem: Clinical Measurements: Goal: Will remain free from infection Outcome: Progressing

## 2018-01-30 NOTE — Progress Notes (Signed)
[facility] for Heparin Indication: chest pain/ACS   Labs: Recent Labs    01/29/18 0734 01/29/18 2005 01/29/18 2124 01/30/18 0058  HGB 9.6*  --   --   --   HCT 32.9*  --   --   --   PLT 246  --   --   --   HEPARINUNFRC  --   --   --  0.29*  CREATININE 0.96  --   --   --   TROPONINI  --  4.23* 5.05*  --    Estimated Creatinine Clearance: 39 mL/min (by C-G formula based on SCr of 0.96 mg/dL).  Assessment: 39 yoF presenting with SOB, found to have positive troponins. Pharmacy consulted to dose IV heparin for r/o ACS. Note patient has h/o PAF not on AC due to h/o GI bleeds. Hgb 9.6, pltc WNL.  Initial heparin level 0.29 units/ml  Goal of Therapy:  Heparin level 0.3-0.7 units/ml Monitor platelets by anticoagulation protocol: Yes   Plan:  Increase heparin infusion to 850 units/hr Check heparin level in ~8 hours Daily heparin level and CBC Monitor s/sx of bleeding  Thanks for allowing pharmacy to be a part of this patient's care.  Excell Seltzer, PharmD Clinical Pharmacist  01/30/2018,1:46 AM

## 2018-01-31 ENCOUNTER — Other Ambulatory Visit: Payer: Self-pay | Admitting: Internal Medicine

## 2018-01-31 DIAGNOSIS — R748 Abnormal levels of other serum enzymes: Secondary | ICD-10-CM | POA: Diagnosis not present

## 2018-01-31 DIAGNOSIS — I5021 Acute systolic (congestive) heart failure: Secondary | ICD-10-CM | POA: Diagnosis not present

## 2018-01-31 DIAGNOSIS — Z950 Presence of cardiac pacemaker: Secondary | ICD-10-CM | POA: Diagnosis not present

## 2018-01-31 DIAGNOSIS — J449 Chronic obstructive pulmonary disease, unspecified: Secondary | ICD-10-CM | POA: Diagnosis not present

## 2018-01-31 DIAGNOSIS — R0602 Shortness of breath: Secondary | ICD-10-CM | POA: Diagnosis not present

## 2018-01-31 LAB — CBC
HEMATOCRIT: 29.2 % — AB (ref 36.0–46.0)
HEMOGLOBIN: 8.6 g/dL — AB (ref 12.0–15.0)
MCH: 22.2 pg — AB (ref 26.0–34.0)
MCHC: 29.5 g/dL — ABNORMAL LOW (ref 30.0–36.0)
MCV: 75.3 fL — ABNORMAL LOW (ref 78.0–100.0)
Platelets: 253 10*3/uL (ref 150–400)
RBC: 3.88 MIL/uL (ref 3.87–5.11)
RDW: 17.7 % — ABNORMAL HIGH (ref 11.5–15.5)
WBC: 7.8 10*3/uL (ref 4.0–10.5)

## 2018-01-31 MED ORDER — CARVEDILOL 3.125 MG PO TABS
3.1250 mg | ORAL_TABLET | Freq: Two times a day (BID) | ORAL | Status: DC
  Administered 2018-01-31 – 2018-02-05 (×10): 3.125 mg via ORAL
  Filled 2018-01-31 (×10): qty 1

## 2018-01-31 NOTE — Care Management Obs Status (Signed)
[facility] NOTIFICATION   Patient Details  Name: Mikayla Harper MRN: [id number] Date of Birth: May 01, 1930   Medicare Observation Status Notification Given:  Yes  Leonhart,Tina Daughter   [phone]     Carles Collet, RN 01/31/2018, 12:00 PM

## 2018-01-31 NOTE — Progress Notes (Signed)
LM requesting callback with son to review MOON letter.  [place], [place] Son   [phone]

## 2018-01-31 NOTE — Clinical Social Work Placement (Signed)
   CLINICAL SOCIAL WORK PLACEMENT  NOTE  Date:  01/31/2018  Patient Details  Name: Mikayla Harper MRN: [id number] Date of Birth: [birth year]  Clinical Social Work is seeking post-discharge placement for this patient at the [facility] level of care (*CSW will initial, date and re-position this form in  chart as items are completed):  Yes   Patient/family provided with [facility] Work Department's list of facilities offering this level of care within the geographic area requested by the patient (or if unable, by the patient's family).  Yes   Patient/family informed of their freedom to choose among providers that offer the needed level of care, that participate in Medicare, Medicaid or managed care program needed by the patient, have an available bed and are willing to accept the patient.  Yes   Patient/family informed of [facility]'s ownership interest in [facility] and [facility], as well as of the fact that they are under no obligation to receive care at these facilities.  PASRR submitted to EDS on 01/31/18     PASRR number received on 01/31/18     Existing PASRR number confirmed on       FL2 transmitted to all facilities in geographic area requested by pt/family on 01/31/18     FL2 transmitted to all facilities within larger geographic area on       Patient informed that his/her managed care company has contracts with or will negotiate with certain facilities, including the following:            Patient/family informed of bed offers received.  Patient chooses bed at       Physician recommends and patient chooses bed at      Patient to be transferred to   on  .  Patient to be transferred to facility by       Patient family notified on   of transfer.  Name of family member notified:        PHYSICIAN Please sign FL2     Additional Comment:    _______________________________________________ Candie Chroman, LCSW 01/31/2018, 3:39  PM

## 2018-01-31 NOTE — Evaluation (Addendum)
Physical Therapy Evaluation Patient Details Name: Mikayla Harper MRN: [id number] DOB: 1930/07/18 Today's Date: 01/31/2018   History of Present Illness  Mikayla Harper is an 84 yo female with PMH of PAF, tachy/brady syndrome s/p PPM, HL, TIA, anemia, COPD and dementia. She is followed by Dr. Caryl Comes as an outpatient. Denies any hx of cardiac work up in the past with stress testing, or cardiac cath. PPM was placed back in 1/13, and noted that she had recurrent GI bleeds 2/2 to AVMs therefore Perry was stopped. She has been on propafenone since 2003. Last echo 1/13 with normal EF and normal LA size with diastolic dysfunction. She was last seen in the office on 5/18 with Dr. Caryl Comes and device was noted to be functioning normally with no programming changes. No recurrent Afib or Aflutter.  Had  SOB that woke her up at 5am and called EMS.     Clinical Impression  Pt admitted with above diagnosis. Pt currently with functional limitations due to the deficits listed below (see PT Problem List). PTA, pt living alone, independent with community ambulation and driving. Upon eval pt presents with no pain or SOB, mild balance deficits and impulsivity noted during ambulation and stair training. Overall, supervision level and moving great. SpO2 remained >94% on RA during session.  Educated patient on safer stair mechanics to improve safety and consider RW for community ambulation. Family not present to discuss level of assistance available after d/c. Rec HHPT to improve patients balance and reduce risk of falls. Patient is open minded as long as she is covered.  Pt will benefit from skilled PT to increase their independence and safety with mobility to allow discharge to the venue listed below.       Follow Up Recommendations Home health PT;Supervision for mobility/OOB    Equipment Recommendations  None recommended by PT    Recommendations for Other Services       Precautions / Restrictions Precautions Precautions:  Fall Restrictions Weight Bearing Restrictions: No      Mobility  Bed Mobility Overal bed mobility: Modified Independent                Transfers Overall transfer level: Needs assistance Equipment used: None Transfers: Sit to/from Stand Sit to Stand: Supervision            Ambulation/Gait Ambulation/Gait assistance: Supervision Ambulation Distance (Feet): 120 Feet Assistive device: None Gait Pattern/deviations: Step-through pattern;Staggering right;Staggering left Gait velocity: normal   General Gait Details: no AD, good speed, 2 mild path deviations with ability to correct on her own.   Stairs Stairs: Yes Stairs assistance: Supervision Stair Management: One rail Right Number of Stairs: 8 General stair comments: patient moving quickly on stairs, alternating pattern. Discussed safety considerations as patient had 1 LOB requirng min A to stabilize when her foot did not fully land on stair, discussed slowing down and trying step to step, patient insists on alternating but agrees to slow down for safe foot placement.   Wheelchair Mobility    Modified Rankin (Stroke Patients Only)       Balance Overall balance assessment: Mild deficits observed, not formally tested                                           Pertinent Vitals/Pain Pain Assessment: No/denies pain    Home Living Family/patient expects to be discharged to:: Private residence Living  Arrangements: Alone Available Help at Discharge: Family;Available PRN/intermittently Type of Home: House Home Access: Stairs to enter Entrance Stairs-Rails: None Entrance Stairs-Number of Steps: 2 Home Layout: One level Home Equipment: Shower seat;Grab bars - tub/shower;Cane - single point;Walker - 4 wheels      Prior Function Level of Independence: Independent with assistive device(s);Needs assistance         Comments: patient driving, independent with mobility      Hand Dominance         Extremity/Trunk Assessment   Upper Extremity Assessment Upper Extremity Assessment: Defer to OT evaluation    Lower Extremity Assessment Lower Extremity Assessment: Overall WFL for tasks assessed       Communication   Communication: No difficulties  Cognition Arousal/Alertness: Awake/alert Behavior During Therapy: WFL for tasks assessed/performed Overall Cognitive Status: Within Functional Limits for tasks assessed                                        General Comments      Exercises     Assessment/Plan    PT Assessment Patient needs continued PT services  PT Problem List Decreased balance;Decreased safety awareness       PT Treatment Interventions DME instruction;Gait training;Stair training;Functional mobility training;Therapeutic activities;Therapeutic exercise;Balance training    PT Goals (Current goals can be found in the Care Plan section)  Acute Rehab PT Goals Patient Stated Goal: return home, HHPT if no financial burden  PT Goal Formulation: With patient Time For Goal Achievement: 02/14/18 Potential to Achieve Goals: Good    Frequency Min 3X/week   Barriers to discharge Decreased caregiver support lives alone- family not present to determine level of assist avail    Co-evaluation               AM-PAC PT "6 Clicks" Daily Activity  Outcome Measure Difficulty turning over in bed (including adjusting bedclothes, sheets and blankets)?: None Difficulty moving from lying on back to sitting on the side of the bed? : None Difficulty sitting down on and standing up from a chair with arms (e.g., wheelchair, bedside commode, etc,.)?: None Help needed moving to and from a bed to chair (including a wheelchair)?: A Little Help needed walking in hospital room?: A Little Help needed climbing 3-5 steps with a railing? : A Little 6 Click Score: 21    End of Session Equipment Utilized During Treatment: Gait belt Activity Tolerance: Patient  tolerated treatment well Patient left: in bed;with call bell/phone within reach;with bed alarm set Nurse Communication: Mobility status PT Visit Diagnosis: Unsteadiness on feet (R26.81)    Time: 1030-1045 PT Time Calculation (min) (ACUTE ONLY): 15 min   Charges:   PT Evaluation $PT Eval Low Complexity: 1 Low     PT G Codes:        Reinaldo Berber, PT, DPT Acute Rehab Services Pager: [phone]    Reinaldo Berber 01/31/2018, 11:35 AM

## 2018-01-31 NOTE — Progress Notes (Addendum)
Progress Note  Patient Name: Mikayla Harper Date of Encounter: 01/31/2018  Primary Cardiologist: No primary care provider on file.   Subjective   No complaints.   Inpatient Medications    Scheduled Meds: . aspirin EC  81 mg Oral Daily  . diltiazem  120 mg Oral Daily  . donepezil  10 mg Oral QHS  . furosemide  20 mg Oral Daily  . pravastatin  40 mg Oral q1800  . propafenone  225 mg Oral Q8H   Continuous Infusions:  PRN Meds: acetaminophen, albuterol, nitroGLYCERIN, ondansetron (ZOFRAN) IV, traZODone   Vital Signs    Vitals:   01/30/18 1928 01/31/18 0607 01/31/18 0640 01/31/18 1048  BP: 112/61 (!) 95/56  107/79  Pulse: 63 60    Resp: 18 18    Temp: (!) 97.5 F (36.4 C) 98.1 F (36.7 C)    TempSrc: Oral Oral    SpO2: 96% 96%    Weight:  147 lb 7.8 oz (66.9 kg) 143 lb (64.9 kg)   Height:   5\' 3"  (1.6 m)     Intake/Output Summary (Last 24 hours) at 01/31/2018 1132 Last data filed at 01/31/2018 0900 Gross per 24 hour  Intake 916.06 ml  Output 850 ml  Net 66.06 ml   Filed Weights   01/30/18 0448 01/31/18 0607 01/31/18 0640  Weight: 142 lb 8 oz (64.6 kg) 147 lb 7.8 oz (66.9 kg) 143 lb (64.9 kg)    Telemetry    NSR - Personally Reviewed  ECG     Physical Exam   GEN: No acute distress.   Neck: No JVD Cardiac: RRR, no murmurs, rubs, or gallops.  Respiratory: Clear to auscultation bilaterally. GI: Soft, nontender, non-distended  MS: No edema; No deformity. Neuro:  Memory decreased Psych: Normal affect   Labs    Chemistry Recent Labs  Lab 01/29/18 0734 01/30/18 0409  NA 134* 134*  K 4.3 4.1  CL 104 100*  CO2 24 25  GLUCOSE 86 104*  BUN 13 18  CREATININE 0.96 1.11*  CALCIUM 9.2 9.2  GFRNONAA 52* 44*  GFRAA >60 51*  ANIONGAP 6 9     Hematology Recent Labs  Lab 01/29/18 0734 01/30/18 0409 01/31/18 0550  WBC 5.0 5.7 7.8  RBC 4.29 4.07 3.88  HGB 9.6* 9.1* 8.6*  HCT 32.9* 30.6* 29.2*  MCV 76.7* 75.2* 75.3*  MCH 22.4* 22.4* 22.2*  MCHC  29.2* 29.7* 29.5*  RDW 18.0* 17.5* 17.7*  PLT 246 270 253    Cardiac Enzymes Recent Labs  Lab 01/29/18 2005 01/29/18 2124 01/30/18 0409  TROPONINI 4.23* 5.05* 4.11*    Recent Labs  Lab 01/29/18 0743 01/29/18 1128  TROPIPOC 0.16* 1.52*     BNP Recent Labs  Lab 01/29/18 2005  BNP 469.5*     DDimer  Recent Labs  Lab 01/29/18 0734  DDIMER 0.62*     Radiology    No results found.  Cardiac Studies   EF 40-45%. Apical hypokinesis  Patient Profile     84 y.o. female with decreased systolic function. Acute systolic heart failure.  BP limits adding more heart failure meds.  Assessment & Plan    1) Plan for scheduled diuretic, Lasix 20 mg daily.  Given LV dysfunction, will switch diltiazem to Carvedilol 3.125 mg BID.  BMet tomorrow.  May need supplemental potassium.  2) Awaiting placement.  Daughter reports the patient has progressive memory issues and does not feel she can be left alone.   For questions  or updates, please contact [facility] Please consult [email or web address] for contact info under Cardiology/STEMI.      Signed, Larae Grooms, MD  01/31/2018, 11:32 AM

## 2018-01-31 NOTE — Evaluation (Signed)
Occupational Therapy Evaluation Patient Details Name: Arnette Driggs MRN: [id number] DOB: [birth year] Today's Date: 01/31/2018    History of Present Illness Mrs. Neidhardt is an 84 yo female with PMH of PAF, tachy/brady syndrome s/p PPM, HL, TIA, anemia, COPD and dementia. She is followed by Dr. Caryl Comes as an outpatient. Denies any hx of cardiac work up in the past with stress testing, or cardiac cath. PPM was placed back in 1/13, and noted that she had recurrent GI bleeds 2/2 to AVMs therefore [place] was stopped. She has been on propafenone since 2003. Last echo 1/13 with normal EF and normal LA size with diastolic dysfunction. She was last seen in the office on 5/18 with Dr. Caryl Comes and device was noted to be functioning normally with no programming changes. No recurrent Afib or Aflutter.  Had  SOB that woke her up at 5am and called EMS.    Clinical Impression   Pt at baseline independent - Mod I level with ADLs, sup with higher level mobility/dynamic standing tasks.PTA, pt living alone, independent with community ambulation and driving. Upon eval pt presents with no pain or SOB, mild balance deficits and impulsivity noted during ADL mobility. SpO2 remained >94% on RA during session. Family not present to discuss level of assistance available after d/c. All education completed and no further acute OT indicated at this time, pt to continue with acute PT services to address functional mobility safety       Follow Up Recommendations  No OT follow up;Supervision - Intermittent    Equipment Recommendations  None recommended by OT    Recommendations for Other Services       Precautions / Restrictions Precautions Precautions: Fall Restrictions Weight Bearing Restrictions: No      Mobility Bed Mobility Overal bed mobility: Modified Independent                Transfers Overall transfer level: Needs assistance Equipment used: None Transfers: Sit to/from Stand Sit to Stand: Supervision               Balance Overall balance assessment: Mild deficits observed, not formally tested                                         ADL either performed or assessed with clinical judgement   ADL Overall ADL's : Independent;At baseline                                             Vision Baseline Vision/History: Wears glasses Wears Glasses: Reading only Patient Visual Report: No change from baseline       Perception     Praxis      Pertinent Vitals/Pain Pain Assessment: No/denies pain     Hand Dominance Right   Extremity/Trunk Assessment Upper Extremity Assessment Upper Extremity Assessment: Overall WFL for tasks assessed   Lower Extremity Assessment Lower Extremity Assessment: Defer to PT evaluation       Communication Communication Communication: No difficulties   Cognition Arousal/Alertness: Awake/alert Behavior During Therapy: WFL for tasks assessed/performed Overall Cognitive Status: Within Functional Limits for tasks assessed  General Comments       Exercises     Shoulder Instructions      Home Living Family/patient expects to be discharged to:: Private residence Living Arrangements: Alone Available Help at Discharge: Family;Available PRN/intermittently Type of Home: House Home Access: Stairs to enter [employer] of Steps: 2 Entrance Stairs-Rails: None Home Layout: One level     Bathroom Shower/Tub: Tub/shower unit;Tub only   Biochemist, clinical: Standard Bathroom Accessibility: Yes   Home Equipment: Shower seat;Grab bars - tub/shower;Cane - single point;Walker - 4 wheels          Prior Functioning/Environment Level of Independence: Independent with assistive device(s);Needs assistance    ADL's / Homemaking Assistance Needed: independent with ADLs, cooking, grocery shopping per pt report   Comments: patient driving, independent with mobility          OT Problem List: Impaired balance (sitting and/or standing);Decreased safety awareness      OT Treatment/Interventions:      OT Goals(Current goals can be found in the care plan section) Acute Rehab OT Goals Patient Stated Goal: return home, HHPT if no financial burden  OT Goal Formulation: With patient  OT Frequency:     Barriers to D/C:    no barriers       Co-evaluation              AM-PAC PT "6 Clicks" Daily Activity     Outcome Measure Help from another person eating meals?: None Help from another person taking care of personal grooming?: None Help from another person toileting, which includes using toliet, bedpan, or urinal?: None Help from another person bathing (including washing, rinsing, drying)?: None Help from another person to put on and taking off regular upper body clothing?: None Help from another person to put on and taking off regular lower body clothing?: None 6 Click Score: 24   End of Session Equipment Utilized During Treatment: Gait belt  Activity Tolerance: Patient tolerated treatment well Patient left: in bed(sitting EOB)  OT Visit Diagnosis: Unsteadiness on feet (R26.81)                Time: 4401-0272 OT Time Calculation (min): 20 min Charges:  OT General Charges $OT Visit: 1 Visit OT Evaluation $OT Eval Low Complexity: 1 Low G-Codes: OT G-codes **NOT FOR INPATIENT CLASS** Functional Assessment Tool Used: AM-PAC 6 Clicks Daily Activity     Britt Bottom 01/31/2018, 1:33 PM

## 2018-01-31 NOTE — NC FL2 (Signed)
[place] LEVEL OF CARE SCREENING TOOL     IDENTIFICATION  Patient Name: Mikayla Harper Birthdate: Oct 20, 1929 Sex: female Admission Date (Current Location): 01/29/2018  [facility] and [place] Number:  Herbalist and Address:  [facility], [place] [address], [place], [place] [address]      Provider Number: [id number]  Attending Physician Name and Address:  Jettie Booze, MD  Relative Name and Phone Number:       Current Level of Care: Hospital Recommended Level of Care: [place] Prior Approval Number:    Date Approved/Denied:   PASRR Number: [id number]  Discharge Plan: SNF    Current Diagnoses: Patient Active Problem List   Diagnosis Date Noted  . Shortness of breath 01/29/2018  . COPD exacerbation ([place]) 01/07/2013  . Chronic cough 12/04/2012  . Dyspnea on exertion 10/22/2012  . COPD (chronic obstructive pulmonary disease) ([place]) 10/17/2012  . Anemia 08/01/2012  . Benign neoplasm of colon 08/01/2012  . Diverticulosis of colon (without mention of hemorrhage) 08/01/2012  . Insomnia 01/24/2012  . Pac[place] 01/24/2012  . Sick sinus syndrome ([place])   . TIA (transient ischemic attack) 10/11/2011  . HTN (hypertension) 10/11/2011  . Atrial fibrillation ([place]) 10/06/2011  . HYPERLIPIDEMIA 10/22/2010    Orientation RESPIRATION BLADDER Height & Weight     Self  Normal Continent Weight: 143 lb (64.9 kg) Height:  5\' 3"  (160 cm)  BEHAVIORAL SYMPTOMS/MOOD NEUROLOGICAL BOWEL NUTRITION STATUS  (None) (None) Continent Diet(Heart healthy)  AMBULATORY STATUS COMMUNICATION OF NEEDS Skin   Supervision Verbally Normal                       Personal Care Assistance Level of Assistance  Bathing, Feeding, Dressing Bathing Assistance: Independent Feeding assistance: Independent Dressing Assistance: Independent     Functional Limitations Info  Sight, Hearing, Speech Sight Info: Adequate Hearing Info:  Adequate Speech Info: Adequate    SPECIAL CARE FACTORS FREQUENCY  PT (By licensed PT)     PT Frequency: 5 x week              Contractures Contractures Info: Not present    Additional Factors Info  Code Status, Allergies Code Status Info: Full Allergies Info: Lisinopril           Current Medications (01/31/2018):  This is the current hospital active medication list Current Facility-Administered Medications  Medication Dose Route Frequency Provider Last Rate Last Dose  . acetaminophen (TYLENOL) tablet 650 mg  650 mg Oral Q4H PRN Reino Bellis B, NP      . albuterol (PROVENTIL) (2.5 MG/3ML) 0.083% nebulizer solution 2.5 mg  2.5 mg Nebulization Q6H PRN Jettie Booze, MD      . aspirin EC tablet 81 mg  81 mg Oral Daily Reino Bellis B, NP   81 mg at 01/31/18 7893  . carvedilol (COREG) tablet 3.125 mg  3.125 mg Oral BID WC Larae Grooms S, MD      . donepezil (ARICEPT) tablet 10 mg  10 mg Oral QHS Reino Bellis B, NP   10 mg at 01/30/18 2240  . furosemide (LASIX) tablet 20 mg  20 mg Oral Daily Reino Bellis B, NP   20 mg at 01/31/18 0918  . nitroGLYCERIN (NITROSTAT) SL tablet 0.4 mg  0.4 mg Sublingual Q5 Min x 3 PRN Reino Bellis B, NP      . ondansetron [facility]) injection 4 mg  4 mg Intravenous Q6H PRN  Reino Bellis B, NP      . pravastatin (PRAVACHOL) tablet 40 mg  40 mg Oral q1800 Reino Bellis B, NP   40 mg at 01/30/18 1701  . propafenone (RYTHMOL) tablet 225 mg  225 mg Oral Q8H Reino Bellis B, NP   225 mg at 01/31/18 1504  . traZODone (DESYREL) tablet 50 mg  50 mg Oral QHS PRN Marcie Mowers, MD   50 mg at 01/30/18 2240     Discharge Medications: Please see discharge summary for a list of discharge medications.  Relevant Imaging Results:  Relevant Lab Results:   Additional Information SS#: [id number]  Candie Chroman, LCSW

## 2018-01-31 NOTE — Clinical Social Work Note (Signed)
Clinical Social Work Assessment  Patient Details  Name: Mikayla Harper MRN: [id number] Date of Birth: [birth year]  Date of referral:  01/31/18               Reason for consult:  Facility Placement, Discharge Planning                Permission sought to share information with:  Facility Sport and exercise psychologist, Family Supports Permission granted to share information::  Yes, Verbal Permission Granted  Name::     Smith Mcnicholas  Agency::  SNF's  Relationship::  Daughter  Contact Information:  [phone]  Housing/Transportation Living arrangements for the past 2 months:  [place] of Information:  Medical Team, Adult Children Patient Interpreter Needed:  None Criminal Activity/Legal Involvement Pertinent to Current Situation/Hospitalization:  No - Comment as needed Significant Relationships:  Adult Children Lives with:  Self Do you feel safe going back to the place where you live?  Yes Need for family participation in patient care:  Yes (Comment)  Care giving concerns:  PT recommending HHPT but patient's daughter very concerned about her returning home alone. Wants rehab and then LTC.   Social Worker assessment / plan:  Patient oriented to self only. Daughter at bedside. CSW introduced role and explained that PT recommendations would be discussed. Patient's daughter is hoping for SNF for rehab and then transition to LTC. Patient does not have Medicaid and cannot afford to pay privately. CSW encouraged her to go to DSS as soon as possible to start applying. CSW made patient's daughter aware that PT recommended HHPT and OT recommended no follow up so [employer] likely will not approve SNF stay. Also notified her that patient is under observation status. Patient's daughter expressed understanding but really wants CSW to try to get placement due to multiple concerns for her to be home alone and confusion. She stated that patient's PCP has been trying to get her into SNF placement and  they have been looking into facilities for about a month. Patient's daughter works night shift at [employer]. They have had no beds to take patient. With daughter working night shift and her husband working day shift, they cannot manage her at home. If insurance denies, they would consider looking into private sitters but do not know how much money she has in her account. Patient rents her home. No further concerns. CSW encouraged patient's daughter to contact CSW as needed. CSW will continue to follow patient and her daughter for support and facilitate discharge to SNF, if approved, once medically stable.  Employment status:  Retired Nurse, adult PT Recommendations:  Home with [place] / Referral to community resources:  [place]  Patient/Family's Response to care:  Patient only oriented to self. Patient's daughter agreeable to SNF placement. Patient's children supportive and involved in patient's care. Patient's daughter appreciated social work intervention.  Patient/Family's Understanding of and Emotional Response to Diagnosis, Current Treatment, and Prognosis:  Patient only oriented to self. Patient's daughter has a good understanding of the reason for admission and is very hopeful for placement after discharge. Patient's daughter appears happy with hospital care.  Emotional Assessment Appearance:  Appears stated age Attitude/Demeanor/Rapport:  Unable to Assess Affect (typically observed):  Unable to Assess Orientation:  Oriented to Self Alcohol / Substance use:  Never Used Psych involvement (Current and /or in the community):  No (Comment)  Discharge Needs  Concerns to be addressed:  Care Coordination Readmission within the last  30 days:  No Current discharge risk:  Cognitively Impaired, Lives alone Barriers to Discharge:  Ship broker, Continued Medical Work up   Candie Chroman, LCSW 01/31/2018, 3:31 PM

## 2018-02-01 DIAGNOSIS — J449 Chronic obstructive pulmonary disease, unspecified: Secondary | ICD-10-CM | POA: Diagnosis not present

## 2018-02-01 DIAGNOSIS — R0602 Shortness of breath: Secondary | ICD-10-CM | POA: Diagnosis not present

## 2018-02-01 DIAGNOSIS — R748 Abnormal levels of other serum enzymes: Secondary | ICD-10-CM | POA: Diagnosis not present

## 2018-02-01 DIAGNOSIS — I5021 Acute systolic (congestive) heart failure: Secondary | ICD-10-CM | POA: Diagnosis not present

## 2018-02-01 DIAGNOSIS — Z950 Presence of cardiac pacemaker: Secondary | ICD-10-CM | POA: Diagnosis not present

## 2018-02-01 LAB — BASIC METABOLIC PANEL
Anion gap: 9 (ref 5–15)
BUN: 31 mg/dL — ABNORMAL HIGH (ref 6–20)
CHLORIDE: 97 mmol/L — AB (ref 101–111)
CO2: 23 mmol/L (ref 22–32)
Calcium: 8.5 mg/dL — ABNORMAL LOW (ref 8.9–10.3)
Creatinine, Ser: 1.51 mg/dL — ABNORMAL HIGH (ref 0.44–1.00)
GFR calc non Af Amer: 30 mL/min — ABNORMAL LOW (ref >60)
GFR, EST AFRICAN AMERICAN: 35 mL/min — AB (ref >60)
Glucose, Bld: 92 mg/dL (ref 65–99)
POTASSIUM: 4.5 mmol/L (ref 3.5–5.1)
SODIUM: 129 mmol/L — AB (ref 135–145)

## 2018-02-01 LAB — CBC
HEMATOCRIT: 28.1 % — AB (ref 36.0–46.0)
Hemoglobin: 8.5 g/dL — ABNORMAL LOW (ref 12.0–15.0)
MCH: 22.7 pg — AB (ref 26.0–34.0)
MCHC: 30.2 g/dL (ref 30.0–36.0)
MCV: 74.9 fL — AB (ref 78.0–100.0)
Platelets: 230 10*3/uL (ref 150–400)
RBC: 3.75 MIL/uL — AB (ref 3.87–5.11)
RDW: 17.4 % — ABNORMAL HIGH (ref 11.5–15.5)
WBC: 8.4 10*3/uL (ref 4.0–10.5)

## 2018-02-01 NOTE — Clinical Social Work Note (Addendum)
Patient fully oriented today. She remembers discussion about SNF yesterday and is still agreeable. Bed offers so far provided: [facility], [facility], [facility], and [facility]. She would like to review with her children when they arrive and will have them call CSW with choice. Also provided SNF list in case facility preference has not offered a bed yet. CSW again explained that it cannot be guaranteed that insurance will cover since she did so well in therapy evaluations and that choice will need to be determined as soon as possible so insurance authorization can be started.  Dayton Scrape, CSW [phone]  2:57 pm Patient's children at bedside. Bed offers reviewed. They have chosen [facility]. Patient is agreeable. CSW left message for admissions coordinator to notify and ask her to start insurance authorization. It typically takes around 24 hours to get authorization determination. CSW again told patient's daughter that we cannot guarantee approval. She expressed understanding but was very happy that patient had bed offers.  Dayton Scrape, CSW [phone]  4:33 pm [facility] not sure if they will have beds tomorrow and will have to follow up in the morning after their meeting. CSW asked they start insurance authorization anyway.  Dayton Scrape, [place]

## 2018-02-01 NOTE — Progress Notes (Addendum)
Progress Note  Patient Name: Mikayla Harper Date of Encounter: 02/01/2018  Primary Cardiologist: Virl Axe, MD   Subjective   Denies chest pain or dyspnea.   Inpatient Medications    Scheduled Meds: . aspirin EC  81 mg Oral Daily  . carvedilol  3.125 mg Oral BID WC  . donepezil  10 mg Oral QHS  . furosemide  20 mg Oral Daily  . pravastatin  40 mg Oral q1800  . propafenone  225 mg Oral Q8H   Continuous Infusions:  PRN Meds: acetaminophen, albuterol, nitroGLYCERIN, ondansetron (ZOFRAN) IV, traZODone   Vital Signs    Vitals:   02/01/18 0700 02/01/18 0704 02/01/18 0812 02/01/18 0812  BP:  (!) 94/51  (!) 99/52  Pulse:  60 62 63  Resp:  18    Temp:  98.2 F (36.8 C)    TempSrc:  Oral    SpO2:  93%  95%  Weight: 142 lb 3.2 oz (64.5 kg)     Height:        Intake/Output Summary (Last 24 hours) at 02/01/2018 1028 Last data filed at 02/01/2018 0900 Gross per 24 hour  Intake 720 ml  Output 1550 ml  Net -830 ml   Filed Weights   01/31/18 0607 01/31/18 0640 02/01/18 0700  Weight: 147 lb 7.8 oz (66.9 kg) 143 lb (64.9 kg) 142 lb 3.2 oz (64.5 kg)    Telemetry    Paced rhythm   ECG    N/a  Physical Exam   GEN: No acute distress.   Neck: No JVD Cardiac: RRR, no murmurs, rubs, or gallops. Trace L LE edema Respiratory: Clear to auscultation bilaterally. GI: Soft, nontender, non-distended  MS: No edema; No deformity. Neuro:  Nonfocal  Psych: Normal affect   Labs    Chemistry Recent Labs  Lab 01/29/18 0734 01/30/18 0409 02/01/18 0039  NA 134* 134* 129*  K 4.3 4.1 4.5  CL 104 100* 97*  CO2 24 25 23   GLUCOSE 86 104* 92  BUN 13 18 31*  CREATININE 0.96 1.11* 1.51*  CALCIUM 9.2 9.2 8.5*  GFRNONAA 52* 44* 30*  GFRAA >60 51* 35*  ANIONGAP 6 9 9      Hematology Recent Labs  Lab 01/30/18 0409 01/31/18 0550 02/01/18 0039  WBC 5.7 7.8 8.4  RBC 4.07 3.88 3.75*  HGB 9.1* 8.6* 8.5*  HCT 30.6* 29.2* 28.1*  MCV 75.2* 75.3* 74.9*  MCH 22.4* 22.2* 22.7*    MCHC 29.7* 29.5* 30.2  RDW 17.5* 17.7* 17.4*  PLT 270 253 230    Cardiac Enzymes Recent Labs  Lab 01/29/18 2005 01/29/18 2124 01/30/18 0409  TROPONINI 4.23* 5.05* 4.11*    Recent Labs  Lab 01/29/18 0743 01/29/18 1128  TROPIPOC 0.16* 1.52*     BNP Recent Labs  Lab 01/29/18 2005  BNP 469.5*     DDimer  Recent Labs  Lab 01/29/18 0734  DDIMER 0.62*     Radiology    No results found.  Cardiac Studies   Echo 01/30/18 Study Conclusions  - Left ventricle: The cavity size was normal. Wall thickness was   normal. Systolic function was mildly to moderately reduced. The   estimated ejection fraction was in the range of 40% to 45%. There   is hypokinesis of the apical myocardium. Doppler parameters are   consistent with abnormal left ventricular relaxation (grade 1   diastolic dysfunction). Doppler parameters are consistent with   high ventricular filling pressure. - Aortic valve: There was mild  regurgitation. - Pericardium, extracardiac: A trivial pericardial effusion was   identified.  Impressions:  - Hypokinesis of the apex with overall mild to moderate LV   dysfunction; mild diastolic dysfunction; no apical thrombus using   definity; mild AI.  Patient Profile     84 yo female with PMH of PAF, tachy/brady syndrome s/p PPM, HL, TIA, anemia, COPD and dementia who presented with shortness of breath. Found to have a positive troponin.  Assessment & Plan    1. Acute systolic CHF - presented with SOB and orthopnea. BNP 469. Diuresed total 432cc. Weight down 2 lb (144>>142lb). Scr worsen today to 1.51 from 0.96 on admission on low dose lasix 20mg  qd. Lung clear. Trace L LE edema. Endorse high sodium diet, likely culpril for edema. She already got her lasix this morning. Check BMET tomorrow and adjust dose.  - Continue low dose BB. BP does not soft for addition of ACE/ARB currently.   2. Elevated troponin  - Peaked at 4.23. Heparin stopped for anemia and lack  of chest pain. Echo with hypokinesis of the apex with overall mild to moderate LV dysfunction; mild diastolic dysfunction; no apical thrombus using definity; mild AI. - At this time, given her age and dementia will treat conservatively.  - Continue ASA, statin and BB.   3. Social situation/demential - PT recommend HH. Social worker to discuss with daughter with discharge planning. Home VS SNF.    4. Tachy/brady syndrome s/p PPM - Paced rhythm  5. PAF - On propafenone.  Dilt changed to BB given LV dysfunction. No OAC given hx of GI bleeds.   5. Anemia - Hx of the same. Hgb 9.6>>9.1>>8.6>>8.5     For questions or updates, please contact [place] Please consult [email or web address] for contact info under Cardiology/STEMI.      Signed, Leanor Kail, PA  02/01/2018, 10:28 AM    I have examined the patient and reviewed assessment and plan and discussed with patient.  Agree with above as stated.  Increased Cr and decreased Hemoglobin.  Recheck labs tomorrow.  Holding lasix.  Decreased EF, but not a candidate for invasive testing. She is feeling well at this time. D/C planning in progress.  Larae Grooms

## 2018-02-01 NOTE — Progress Notes (Signed)
Physical Therapy Treatment Patient Details Name: Mikayla Harper MRN: [id number] DOB: 14-Nov-1929 Today's Date: 02/01/2018    History of Present Illness Mikayla Harper is an 84 yo female with PMH of PAF, tachy/brady syndrome s/p PPM, HL, TIA, anemia, COPD and dementia. She is followed by Dr. Caryl Comes as an outpatient. Denies any hx of cardiac work up in the past with stress testing, or cardiac cath. PPM was placed back in 1/13, and noted that she had recurrent GI bleeds 2/2 to AVMs therefore [place] was stopped. She has been on propafenone since 2003. Last echo 1/13 with normal EF and normal LA size with diastolic dysfunction. She was last seen in the office on 5/18 with Dr. Caryl Comes and device was noted to be functioning normally with no programming changes. No recurrent Afib or Aflutter.  Had  SOB that woke her up at 5am and called EMS.     PT Comments    Patient continues to be close supervision intermittent guard with OOB mobility, ambulating the unit and stairs without AD. Patient did exhibit increased imbalance today during ambulation with x2 instances requiring min guard to provide stability. Per chart, patients family have concerns with their availability for support at home. Feel patient is safer with supervision for all mobility OOB at this point to prevent a risk of falling. Due to lack of supervision at home, updating discharge recs to reflect SNF to improve balance and safety.    Follow Up Recommendations  SNF;Supervision for mobility/OOB     Equipment Recommendations  None recommended by PT    Recommendations for Other Services       Precautions / Restrictions Precautions Precautions: Fall Restrictions Weight Bearing Restrictions: No    Mobility  Bed Mobility Overal bed mobility: Modified Independent                Transfers Overall transfer level: Needs assistance Equipment used: None Transfers: Sit to/from Stand Sit to Stand: Supervision             Ambulation/Gait Ambulation/Gait assistance: Supervision;Min guard Ambulation Distance (Feet): 200 Feet Assistive device: None Gait Pattern/deviations: Step-through pattern;Staggering right;Staggering left Gait velocity: normal   General Gait Details: patient with several instances of imabalcne today requring min guard to stabilize. no asssitve device.   Stairs Stairs: Yes Stairs assistance: Supervision Stair Management: One rail Right Number of Stairs: 12 General stair comments: pt continuing to walk fast on stairs, discussed slowing down again, close supervision level for safety.    Wheelchair Mobility    Modified Rankin (Stroke Patients Only)       Balance                                            Cognition Arousal/Alertness: Awake/alert Behavior During Therapy: WFL for tasks assessed/performed Overall Cognitive Status: Within Functional Limits for tasks assessed                                        Exercises      General Comments        Pertinent Vitals/Pain Pain Assessment: No/denies pain    Home Living                      Prior Function  PT Goals (current goals can now be found in the care plan section) Acute Rehab PT Goals Patient Stated Goal: return home, HHPT if no financial burden  PT Goal Formulation: With patient Time For Goal Achievement: 02/14/18 Potential to Achieve Goals: Good Progress towards PT goals: Progressing toward goals    Frequency    Min 2X/week      PT Plan Discharge plan needs to be updated    Co-evaluation              AM-PAC PT "6 Clicks" Daily Activity  Outcome Measure  Difficulty turning over in bed (including adjusting bedclothes, sheets and blankets)?: None Difficulty moving from lying on back to sitting on the side of the bed? : None Difficulty sitting down on and standing up from a chair with arms (e.g., wheelchair, bedside commode,  etc,.)?: None Help needed moving to and from a bed to chair (including a wheelchair)?: A Little Help needed walking in hospital room?: A Little Help needed climbing 3-5 steps with a railing? : A Little 6 Click Score: 21    End of Session Equipment Utilized During Treatment: Gait belt Activity Tolerance: Patient tolerated treatment well Patient left: in bed;with call bell/phone within reach;with bed alarm set Nurse Communication: Mobility status PT Visit Diagnosis: Unsteadiness on feet (R26.81)     Time: 1123-1140 PT Time Calculation (min) (ACUTE ONLY): 17 min  Charges:  $Gait Training: 8-22 mins                    G Codes:      Reinaldo Berber, PT, DPT Acute Rehab Services Pager: [place] 02/01/2018, 3:46 PM

## 2018-02-02 DIAGNOSIS — R0602 Shortness of breath: Secondary | ICD-10-CM | POA: Diagnosis not present

## 2018-02-02 DIAGNOSIS — R748 Abnormal levels of other serum enzymes: Secondary | ICD-10-CM | POA: Diagnosis not present

## 2018-02-02 DIAGNOSIS — I519 Heart disease, unspecified: Secondary | ICD-10-CM | POA: Diagnosis not present

## 2018-02-02 DIAGNOSIS — I5022 Chronic systolic (congestive) heart failure: Secondary | ICD-10-CM

## 2018-02-02 DIAGNOSIS — I214 Non-ST elevation (NSTEMI) myocardial infarction: Secondary | ICD-10-CM

## 2018-02-02 DIAGNOSIS — I5042 Chronic combined systolic (congestive) and diastolic (congestive) heart failure: Secondary | ICD-10-CM

## 2018-02-02 LAB — BASIC METABOLIC PANEL
Anion gap: 5 (ref 5–15)
BUN: 23 mg/dL — AB (ref 6–20)
CHLORIDE: 100 mmol/L — AB (ref 101–111)
CO2: 27 mmol/L (ref 22–32)
CREATININE: 1.16 mg/dL — AB (ref 0.44–1.00)
Calcium: 8.9 mg/dL (ref 8.9–10.3)
GFR calc non Af Amer: 42 mL/min — ABNORMAL LOW (ref >60)
GFR, EST AFRICAN AMERICAN: 48 mL/min — AB (ref >60)
GLUCOSE: 95 mg/dL (ref 65–99)
Potassium: 4.1 mmol/L (ref 3.5–5.1)
SODIUM: 132 mmol/L — AB (ref 135–145)

## 2018-02-02 LAB — CBC
HCT: 28.8 % — ABNORMAL LOW (ref 36.0–46.0)
HEMOGLOBIN: 8.4 g/dL — AB (ref 12.0–15.0)
MCH: 22.2 pg — AB (ref 26.0–34.0)
MCHC: 29.2 g/dL — AB (ref 30.0–36.0)
MCV: 76 fL — ABNORMAL LOW (ref 78.0–100.0)
PLATELETS: 223 10*3/uL (ref 150–400)
RBC: 3.79 MIL/uL — ABNORMAL LOW (ref 3.87–5.11)
RDW: 17.7 % — AB (ref 11.5–15.5)
WBC: 6.5 10*3/uL (ref 4.0–10.5)

## 2018-02-02 MED ORDER — FUROSEMIDE 20 MG PO TABS: 20.0000 mg | ORAL_TABLET | Freq: Every day | ORAL | 11 refills | Status: DC

## 2018-02-02 MED ORDER — HEPARIN SODIUM (PORCINE) 5000 UNIT/ML IJ SOLN
5000.0000 [IU] | Freq: Three times a day (TID) | INTRAMUSCULAR | Status: DC
  Administered 2018-02-02 – 2018-02-05 (×10): 5000 [IU] via SUBCUTANEOUS
  Filled 2018-02-02 (×10): qty 1

## 2018-02-02 MED ORDER — ASPIRIN 81 MG PO TBEC: 81.0000 mg | DELAYED_RELEASE_TABLET | Freq: Every day | ORAL | Status: AC

## 2018-02-02 MED ORDER — CARVEDILOL 3.125 MG PO TABS: 3.1250 mg | ORAL_TABLET | Freq: Two times a day (BID) | ORAL | 3 refills | Status: DC

## 2018-02-02 MED ORDER — NITROGLYCERIN 0.4 MG SL SUBL: 0.4000 mg | SUBLINGUAL_TABLET | SUBLINGUAL | 12 refills | Status: AC | PRN

## 2018-02-02 NOTE — Progress Notes (Addendum)
Progress Note  Patient Name: Mikayla Harper Date of Encounter: 02/02/2018  Primary Cardiologist: Virl Axe, MD   Subjective   Waiting bed placement. No chest pain or dyspnea.   Inpatient Medications    Scheduled Meds: . aspirin EC  81 mg Oral Daily  . carvedilol  3.125 mg Oral BID WC  . donepezil  10 mg Oral QHS  . heparin injection (subcutaneous)  5,000 Units Subcutaneous Q8H  . pravastatin  40 mg Oral q1800  . propafenone  225 mg Oral Q8H   Continuous Infusions:  PRN Meds: acetaminophen, albuterol, nitroGLYCERIN, ondansetron (ZOFRAN) IV, traZODone   Vital Signs    Vitals:   02/01/18 2120 02/02/18 0533 02/02/18 0540 02/02/18 0800  BP: 112/72  135/65 130/80  Pulse: 62  63   Resp: 18  18   Temp: 98.1 F (36.7 C)  97.6 F (36.4 C)   TempSrc: Oral  Oral   SpO2: 98%  96%   Weight:  143 lb 1 oz (64.9 kg)    Height:        Intake/Output Summary (Last 24 hours) at 02/02/2018 0942 Last data filed at 02/02/2018 0836 Gross per 24 hour  Intake 1300 ml  Output 1700 ml  Net -400 ml   Filed Weights   01/31/18 0640 02/01/18 0700 02/02/18 0533  Weight: 143 lb (64.9 kg) 142 lb 3.2 oz (64.5 kg) 143 lb 1 oz (64.9 kg)    Telemetry   a paced  - Personally Reviewed  ECG    N/A  Physical Exam   GEN: No acute distress.   Neck: No JVD Cardiac: RRR, no murmurs, rubs, or gallops. No edema.  Respiratory: Clear to auscultation bilaterally. GI: Soft, nontender, non-distended  MS: No edema; No deformity. Neuro:  Nonfocal  Psych: Normal affect   Labs    Chemistry Recent Labs  Lab 01/29/18 0734 01/30/18 0409 02/01/18 0039  NA 134* 134* 129*  K 4.3 4.1 4.5  CL 104 100* 97*  CO2 24 25 23   GLUCOSE 86 104* 92  BUN 13 18 31*  CREATININE 0.96 1.11* 1.51*  CALCIUM 9.2 9.2 8.5*  GFRNONAA 52* 44* 30*  GFRAA >60 51* 35*  ANIONGAP 6 9 9      Hematology Recent Labs  Lab 01/31/18 0550 02/01/18 0039 02/02/18 0649  WBC 7.8 8.4 6.5  RBC 3.88 3.75* 3.79*  HGB  8.6* 8.5* 8.4*  HCT 29.2* 28.1* 28.8*  MCV 75.3* 74.9* 76.0*  MCH 22.2* 22.7* 22.2*  MCHC 29.5* 30.2 29.2*  RDW 17.7* 17.4* 17.7*  PLT 253 230 223    Cardiac Enzymes Recent Labs  Lab 01/29/18 2005 01/29/18 2124 01/30/18 0409  TROPONINI 4.23* 5.05* 4.11*    Recent Labs  Lab 01/29/18 0743 01/29/18 1128  TROPIPOC 0.16* 1.52*     BNP Recent Labs  Lab 01/29/18 2005  BNP 469.5*     DDimer  Recent Labs  Lab 01/29/18 0734  DDIMER 0.62*     Radiology    No results found.  Cardiac Studies   Echo 01/30/18 Study Conclusions  - Left ventricle: The cavity size was normal. Wall thickness was normal. Systolic function was mildly to moderately reduced. The estimated ejection fraction was in the range of 40% to 45%. There is hypokinesis of the apical myocardium. Doppler parameters are consistent with abnormal left ventricular relaxation (grade 1 diastolic dysfunction). Doppler parameters are consistent with high ventricular filling pressure. - Aortic valve: There was mild regurgitation. - Pericardium, extracardiac: A trivial  pericardial effusion was identified.  Impressions:  - Hypokinesis of the apex with overall mild to moderate LV dysfunction; mild diastolic dysfunction; no apical thrombus using definity; mild AI.  Patient Profile     84 yo female with PMH of PAF, tachy/brady syndrome s/p PPM, HL, TIA, anemia, COPD and dementia who presented with shortness of breath. Found to have a positive troponin.  Assessment & Plan    1. Acute systolic CHF - presented with SOB and orthopnea. BNP 469. Diuresed total 1L. Weight down 2 lb (144>>143lb). 500cc negative in past 24 hours however gained 1 lb. Scr worsen today to 1.51 from 0.96 on admission on low dose lasix 20mg  qd. Lung clear. Lasix held this morning. Pending BMET today.  - Continue low dose BB.  No ACE/ARB due to AKI. BP improved.   2. Elevated troponin  - Peaked at 4.23. Heparin  stopped for anemia and lack of chest pain. Echo with hypokinesis of the apex with overall mild to moderate LV dysfunction; mild diastolic dysfunction; no apical thrombus using definity; mild AI. - At this time, given her age and dementia will treat conservatively.  - Continue ASA, statin and BB.   3. Social situation/demential - waiting SNF bed.   4. Tachy/brady syndrome s/p PPM - Paced rhythm  5. PAF - On propafenone.  Dilt changed to BB given LV dysfunction. No OAC given hx of GI bleeds.   5. Anemia - Hx of the same.Hgb 9.6>>9.1>>8.6>>8.5>>8.4. Denies blood in stool. Check CBC in few days at facility.    For questions or updates, please contact [place] Please consult [email or web address] for contact info under Cardiology/STEMI.      Signed, Leanor Kail, PA  02/02/2018, 9:42 AM    I have examined the patient and reviewed assessment and plan and discussed with patient.  Agree with above as stated.  Hoping for placement later today.  Starting DVT prophylaxis in the event she cannot be discharged today.  Memory issues are significant per the daughter.  Decreased EF.  Medical management for systolic dysfunction.  Larae Grooms

## 2018-02-02 NOTE — Progress Notes (Signed)
PT stable, vitals stable, walking inside the room, denies CP and SOB, waiting for SNF approval, will continue to monitor   Palma Holter, RN

## 2018-02-02 NOTE — Progress Notes (Signed)
Patient currently calm, resting.   Will continue to monitor.  Joselito Fieldhouse, RN

## 2018-02-02 NOTE — Progress Notes (Signed)
Physical Therapy Treatment Patient Details Name: Mikayla Harper MRN: [id number] DOB: July 25, 1930 Today's Date: 02/02/2018    History of Present Illness Mikayla Harper is an 84 yo female with PMH of PAF, tachy/brady syndrome s/p PPM, HL, TIA, anemia, COPD and dementia. She is followed by Dr. Caryl Comes as an outpatient. Denies any hx of cardiac work up in the past with stress testing, or cardiac cath. PPM was placed back in 1/13, and noted that she had recurrent GI bleeds 2/2 to AVMs therefore Clifford was stopped. She has been on propafenone since 2003. Last echo 1/13 with normal EF and normal LA size with diastolic dysfunction. She was last seen in the office on 5/18 with Dr. Caryl Comes and device was noted to be functioning normally with no programming changes. No recurrent Afib or Aflutter.  Had  SOB that woke her up at 5am and called EMS.     PT Comments    Patient ambulating further distances this visit, however continues to struggle with duel tasking when conversing with therapist patient experiences several signficant LOB that presents patient with increased risk of falling. Patient requires physical assistance for safety while mobilizing out of bed in these instances. SNF recs still appropriate.    Follow Up Recommendations  SNF;Supervision for mobility/OOB     Equipment Recommendations  None recommended by PT    Recommendations for Other Services       Precautions / Restrictions Precautions Precautions: Fall Restrictions Weight Bearing Restrictions: No    Mobility  Bed Mobility Overal bed mobility: Modified Independent                Transfers Overall transfer level: Needs assistance Equipment used: None Transfers: Sit to/from Stand Sit to Stand: Supervision            Ambulation/Gait Ambulation/Gait assistance: Supervision;Min guard Ambulation Distance (Feet): 250 Feet Assistive device: None Gait Pattern/deviations: Step-through pattern;Staggering right;Staggering  left Gait velocity: normal   General Gait Details: patient without AD, while duel tasking during convo pt with several LOB requring physical assistance to correct, min A at most.    Stairs Stairs: Yes Stairs assistance: Supervision Stair Management: One rail Right Number of Stairs: 8 General stair comments: cues for slowing down, patient begining to follow but inconsitent cary over to date. unsafe without stand by assistance.    Wheelchair Mobility    Modified Rankin (Stroke Patients Only)       Balance Overall balance assessment: Mild deficits observed, not formally tested                                          Cognition Arousal/Alertness: Awake/alert Behavior During Therapy: WFL for tasks assessed/performed Overall Cognitive Status: Within Functional Limits for tasks assessed                                        Exercises      General Comments        Pertinent Vitals/Pain      Home Living                      Prior Function            PT Goals (current goals can now be found in the care plan section) Acute Rehab PT  Goals Patient Stated Goal: return home, HHPT if no financial burden  PT Goal Formulation: With patient Time For Goal Achievement: 02/14/18 Potential to Achieve Goals: Good    Frequency    Min 2X/week      PT Plan Discharge plan needs to be updated    Co-evaluation              AM-PAC PT "6 Clicks" Daily Activity  Outcome Measure  Difficulty turning over in bed (including adjusting bedclothes, sheets and blankets)?: None Difficulty moving from lying on back to sitting on the side of the bed? : None Difficulty sitting down on and standing up from a chair with arms (e.g., wheelchair, bedside commode, etc,.)?: None Help needed moving to and from a bed to chair (including a wheelchair)?: A Little Help needed walking in hospital room?: A Little Help needed climbing 3-5 steps with a  railing? : A Little 6 Click Score: 21    End of Session Equipment Utilized During Treatment: Gait belt Activity Tolerance: Patient tolerated treatment well Patient left: in bed;with call bell/phone within reach;with bed alarm set Nurse Communication: Mobility status PT Visit Diagnosis: Unsteadiness on feet (R26.81)     Time: 3220-2542 PT Time Calculation (min) (ACUTE ONLY): 17 min  Charges:  $Gait Training: 8-22 mins                    G Codes:       Reinaldo Berber, PT, DPT Acute Rehab Services Pager: [phone]     Reinaldo Berber 02/02/2018, 6:09 PM

## 2018-02-02 NOTE — Progress Notes (Addendum)
Patient upset, stating that she wants to go home. Tried to explain situation and to reassure patient however she is persistent about going home. Called son Annalese Stiner, he is coming to the hospital to talk with the patient.  Will continue to monitor.  Jakeb Lamping, RN

## 2018-02-02 NOTE — Progress Notes (Signed)
Gave scheduled medication earlier and trazodone to calm down the patient. Son at bedside, talking with the patient. Informed MD cardiology on call. No new orders at this time.   Will continue to monitor.  Verena Shawgo, RN

## 2018-02-02 NOTE — Discharge Summary (Addendum)
Discharge Summary    Patient ID: Mikayla Harper,  MRN: [id number], DOB/AGE: [birth year] 83 y.o.  Admit date: 01/29/2018 Discharge date: 02/05/2018  Primary Care Provider: Haywood Pao Primary Cardiologist: Dr. Caryl Comes   Discharge Diagnoses    Principal Problem:   Acute systolic CHF (congestive heart failure) ([place]) Active Problems:   Shortness of breath   Elevated troponin   Systolic dysfunction   PAF   Anemia   Tachy/Brady syndrome   Dementia   Allergies Allergies  Allergen Reactions  . Lisinopril Other (See Comments)    Dizzy and lightheadedness    Diagnostic Studies/Procedures    Echo 01/30/18 Study Conclusions  - Left ventricle: The cavity size was normal. Wall thickness was normal. Systolic function was mildly to moderately reduced. The estimated ejection fraction was in the range of 40% to 45%. There is hypokinesis of the apical myocardium. Doppler parameters are consistent with abnormal left ventricular relaxation (grade 1 diastolic dysfunction). Doppler parameters are consistent with high ventricular filling pressure. - Aortic valve: There was mild regurgitation. - Pericardium, extracardiac: A trivial pericardial effusion was identified.  Impressions:  - Hypokinesis of the apex with overall mild to moderate LV dysfunction; mild diastolic dysfunction; no apical thrombus using definity; mild AI.   History of Present Illness     84 yo female with PMH of PAF, tachy/brady syndrome s/p PPM, HL, TIA, anemia, COPD and dementia who presented with shortness of breath. Found to have a positive troponin. Placed on IV heparin and admitted for further work up.   Hospital Course     Consultants: None  1. Acute systolic CHF - Presented with SOB and orthopnea. BNP 469. Echo showed LVEF of 40-45%. Diuresed total 1L. Weight down 2 lb (144>>143lb). with diuresis, Scr worsen today to 1.51. Held lasix for one day with improved SCr 1.16.  Will  plan for lasix 20mg  3x a week on MWF. Recheck BMET 5/16.  - Continue low dose BB.  No ACE/ARB due to AKI>>> consider as outpatient. BP stable.   2. Elevated troponin  - Peaked at 4.23. Heparin stopped for anemia and lack of chest pain. Echo with hypokinesis of the apex with overall mild to moderate LV dysfunction; mild diastolic dysfunction; no apical thrombus using definity; mild AI. Given her age and dementia plan to treat conservatively. - Continue ASA, statin and BB.   3.Tachy/brady syndrome s/p PPM - A Paced rhythm  4. PAF - On propafenone.Diltazem changed to BB given LV dysfunction. HR stable. No OAC given hx of GI bleeds.   5. Anemia -Hx of the same.Hgb 9.6>>9.1>>8.6>>8.5>>8.4. Denies blood in stool. - check CBC on 5/16.   Initial plan was to attempt discharge to SNF per family request. SW consulted and looked for options in placement but was denied by insurance given her relatively stable functional status. She will return home with daughter.   The patient has been seen by Dr. Ellyn Hack today and deemed ready for discharge home. All follow-up appointments have been scheduled. Discharge medications are listed below.    Discharge Vitals Blood pressure 101/61, pulse 60, temperature 98.3 F (36.8 C), temperature source Oral, resp. rate 20, height 5\' 3"  (1.6 m), weight 144 lb 9.6 oz (65.6 kg), SpO2 96 %.  Filed Weights   02/03/18 0448 02/04/18 0300 02/05/18 0401  Weight: 142 lb 8 oz (64.6 kg) 145 lb 8 oz (66 kg) 144 lb 9.6 oz (65.6 kg)    Labs & Radiologic Studies    CBC  Recent Labs    02/03/18 0602  WBC 5.1  HGB 8.2*  HCT 27.7*  MCV 76.3*  PLT 827   Basic Metabolic Panel Recent Labs    02/03/18 0602 02/04/18 0645  NA 132* 130*  K 4.4 4.4  CL 98* 98*  CO2 26 27  GLUCOSE 82 89  BUN 21* 19  CREATININE 1.02* 1.19*  CALCIUM 8.8* 8.8*    Dg Chest 2 View  Result Date: 01/29/2018 CLINICAL DATA:  84 year old female with shortness of breath which woke her  from sleep. EXAM: CHEST - 2 VIEW COMPARISON:  05/18/2016 chest radiographs and earlier. FINDINGS: Upright AP and lateral views of the chest. Chronic large right upper lung granuloma is unchanged since 2009. There are smaller adjacent calcified granulomas. Chronic pulmonary hyperinflation with centrilobular emphysema demonstrated by CT in 2009. Mildly lower lung volumes today. No pneumothorax, pulmonary edema, pleural effusion or confluent pulmonary opacity. Stable left chest dual lead cardiac pacemaker. Stable cardiac size and mediastinal contours. Mildly tortuous thoracic aorta. No acute osseous abnormality identified. Negative visible bowel gas pattern. IMPRESSION: 1.  No acute cardiopulmonary abnormality. 2. Chronic Emphysema (ICD10-J43.9). Electronically Signed   By: Genevie Ann M.D.   On: 01/29/2018 07:31    Disposition   Pt is being discharged home today in good condition.  Follow-up Plans & Appointments     Contact information for follow-up providers    Tisovec, Fransico Him, MD Follow up in 5 day(s).   Specialty:  Internal Medicine Why:  for hospital follow up Contact information: [place] [address] [phone]        Deboraha Sprang, MD. Go on 03/06/2018.   Specialty:  Cardiology Why:  @1 :30pm for routine follow & hospital up Contact information: [address] [place] [address] [phone]        Richardson Dopp T, PA-C Follow up on 02/14/2018.   Specialties:  Cardiology, Physician Assistant Why:  at 8:30am for your follow up appt.  Contact information: [address] [place] [address] [phone]        [facility] Follow up on 02/08/2018.   Why:  Please come in for labs.  Contact information: [place] [address] [phone]           Contact information for after-discharge care    Destination    [facility] .   Service:  Skilled Nursing Contact information: [address] [place] [phone]                 Discharge Instructions    ([place]amar) Call MD:  Anytime you have any of the following symptoms: 1) 3 pound weight gain in 24 hours or 5 pounds in 1 week 2) shortness of breath, with or without a dry hacking cough 3) swelling in the hands, feet or stomach 4) if you have to sleep on extra pillows at night in order to breathe.   Complete by:  As directed    Diet - low sodium heart healthy   Complete by:  As directed    Increase activity slowly   Complete by:  As directed      Discharge Medications   Allergies as of 02/05/2018      Reactions   Lisinopril Other (See Comments)   Dizzy and lightheadedness      Medication List    STOP taking these  medications   diltiazem 120 MG 24 hr capsule Commonly known as:  CARTIA XT     TAKE these medications   albuterol 108 (90 Base) MCG/ACT inhaler Commonly known as:  PROVENTIL HFA;VENTOLIN HFA Inhale 2 puffs into the lungs every 6 (six) hours as needed for wheezing.   aspirin 81 MG EC tablet Take 1 tablet (81 mg total) by mouth daily.   carvedilol 3.125 MG tablet Commonly known as:  COREG Take 1 tablet (3.125 mg total) by mouth 2 (two) times daily with a meal.   donepezil 10 MG tablet Commonly known as:  ARICEPT Take 1 tablet daily   furosemide 20 MG tablet Commonly known as:  LASIX Take one tablet 3x a week, on Monday, Wednesday and Friday.   guaiFENesin 600 MG 12 hr tablet Commonly known as:  MUCINEX Take 600 mg by mouth 2 (two) times daily as needed for cough.   ipratropium-albuterol 0.5-2.5 (3) MG/3ML Soln Commonly known as:  DUONEB Take 3 mLs by nebulization 4 (four) times daily.   lovastatin 20 MG tablet Commonly known as:  MEVACOR Take 20 mg by mouth at bedtime.   nitroGLYCERIN 0.4 MG SL tablet Commonly known as:  NITROSTAT Place 1 tablet (0.4 mg  total) under the tongue every 5 (five) minutes x 3 doses as needed for chest pain.   oxybutynin 5 MG tablet Commonly known as:  DITROPAN Take 5 mg by mouth daily.   propafenone 225 MG tablet Commonly known as:  RYTHMOL TAKE 1 TABLET BY MOUTH EVERY 8 HOURS   WOMENS MULTIVITAMIN PLUS PO Take 1 tablet by mouth daily.        Outstanding Labs/Studies   BMET/CBC on 5/16  Duration of Discharge Encounter   Greater than 30 minutes including physician time.  Signed, Reino Bellis NP-C 02/05/2018, 1:48 PM   I have seen, examined and evaluated the patient this AM along with Reino Bellis, NP-C.  After reviewing all the available data and chart, we discussed the patients laboratory, study & physical findings as well as symptoms in detail. I agree with her findings, examination as well as impression recommendations & D/c Summary as per our discussion.  Patient was ready for discharge over the weekend, but was kept inpatient while awaiting consideration for skilled nursing facility bed.  However after insurance review, she was not felt to be a skilled nursing facility candidate and therefore stable for discharge home.  Treated medically for elevated troponin and heart failure.  Stable clinically ready for discharge.    Glenetta Hew, M.D., M.S. Interventional Cardiologist   Pager # [phone] Phone # [phone] [address] [place], [place] [address]

## 2018-02-02 NOTE — Clinical Social Work Note (Addendum)
CSW confirmed with [facility] that they started authorization yesterday. Still pending. PA and MD aware.  Mikayla Harper, [name]  9:49 am [facility] admissions coordinator requesting that family come to facility to complete paperwork around 1:00 pm. CSW sent daughter-in-law a text message, per request, to ask.  Mikayla Harper, [name]

## 2018-02-02 NOTE — Progress Notes (Signed)
CSW following for discharge plan. Patient still has not received insurance approval for SNF placement at this time; has been forwarded to Medical Director, will not have decision until Monday.   CSW was contacted by patient's daughter this afternoon that she toured [facility] but does not want her mother to go there, would like to admit to [facility] instead. CSW alerted facilities, [facility]. CSW updated patient's daughter.  CSW will continue to follow. CSW asked about facility willingness to accept LOG pending authorization for placement, and facility is not willing to accept the patient prior to authorization being received. CSW alerted MD about barriers to discharge.  Laveda Abbe, [place] Clinical Social Worker [phone]

## 2018-02-03 DIAGNOSIS — R748 Abnormal levels of other serum enzymes: Secondary | ICD-10-CM | POA: Diagnosis not present

## 2018-02-03 DIAGNOSIS — I519 Heart disease, unspecified: Secondary | ICD-10-CM | POA: Diagnosis not present

## 2018-02-03 DIAGNOSIS — R0602 Shortness of breath: Secondary | ICD-10-CM | POA: Diagnosis not present

## 2018-02-03 DIAGNOSIS — I5021 Acute systolic (congestive) heart failure: Secondary | ICD-10-CM | POA: Diagnosis not present

## 2018-02-03 LAB — CBC
HEMATOCRIT: 27.7 % — AB (ref 36.0–46.0)
Hemoglobin: 8.2 g/dL — ABNORMAL LOW (ref 12.0–15.0)
MCH: 22.6 pg — AB (ref 26.0–34.0)
MCHC: 29.6 g/dL — ABNORMAL LOW (ref 30.0–36.0)
MCV: 76.3 fL — ABNORMAL LOW (ref 78.0–100.0)
PLATELETS: 231 10*3/uL (ref 150–400)
RBC: 3.63 MIL/uL — ABNORMAL LOW (ref 3.87–5.11)
RDW: 17.7 % — AB (ref 11.5–15.5)
WBC: 5.1 10*3/uL (ref 4.0–10.5)

## 2018-02-03 LAB — BASIC METABOLIC PANEL
Anion gap: 8 (ref 5–15)
BUN: 21 mg/dL — AB (ref 6–20)
CO2: 26 mmol/L (ref 22–32)
CREATININE: 1.02 mg/dL — AB (ref 0.44–1.00)
Calcium: 8.8 mg/dL — ABNORMAL LOW (ref 8.9–10.3)
Chloride: 98 mmol/L — ABNORMAL LOW (ref 101–111)
GFR calc Af Amer: 56 mL/min — ABNORMAL LOW (ref >60)
GFR calc non Af Amer: 49 mL/min — ABNORMAL LOW (ref >60)
Glucose, Bld: 82 mg/dL (ref 65–99)
POTASSIUM: 4.4 mmol/L (ref 3.5–5.1)
Sodium: 132 mmol/L — ABNORMAL LOW (ref 135–145)

## 2018-02-03 NOTE — Progress Notes (Signed)
Progress Note  Patient Name: Mikayla Harper Date of Encounter: 02/03/2018  Primary Cardiologist: Virl Axe, MD   Subjective   Waiting bed placement. No chest pain or dyspnea. Looking comfortable.  Inpatient Medications    Scheduled Meds: . aspirin EC  81 mg Oral Daily  . carvedilol  3.125 mg Oral BID WC  . donepezil  10 mg Oral QHS  . heparin injection (subcutaneous)  5,000 Units Subcutaneous Q8H  . pravastatin  40 mg Oral q1800  . propafenone  225 mg Oral Q8H   Continuous Infusions:  PRN Meds: acetaminophen, albuterol, nitroGLYCERIN, ondansetron (ZOFRAN) IV, traZODone   Vital Signs    Vitals:   02/02/18 1650 02/02/18 2025 02/02/18 2055 02/03/18 0448  BP: 130/70 139/66 96/72 (!) 105/53  Pulse: 65 60 (!) 59 70  Resp:  18 17 18   Temp:  98.1 F (36.7 C) 98.1 F (36.7 C) 98.4 F (36.9 C)  TempSrc:  Oral Oral Oral  SpO2:  95% 96% 96%  Weight:    142 lb 8 oz (64.6 kg)  Height:        Intake/Output Summary (Last 24 hours) at 02/03/2018 0827 Last data filed at 02/03/2018 0516 Gross per 24 hour  Intake 1060 ml  Output 2050 ml  Net -990 ml   Filed Weights   02/01/18 0700 02/02/18 0533 02/03/18 0448  Weight: 142 lb 3.2 oz (64.5 kg) 143 lb 1 oz (64.9 kg) 142 lb 8 oz (64.6 kg)    Telemetry   a paced  - Personally Reviewed  ECG    N/A  Physical Exam   GEN: No acute distress.   Neck: No JVD Cardiac: RRR, no murmurs, rubs, or gallops. No edema.  Respiratory: Clear to auscultation bilaterally. GI: Soft, nontender, non-distended  MS: No edema; No deformity. Neuro:  Nonfocal  Psych: Normal affect   Labs    Chemistry Recent Labs  Lab 02/01/18 0039 02/02/18 0936 02/03/18 0602  NA 129* 132* 132*  K 4.5 4.1 4.4  CL 97* 100* 98*  CO2 23 27 26   GLUCOSE 92 95 82  BUN 31* 23* 21*  CREATININE 1.51* 1.16* 1.02*  CALCIUM 8.5* 8.9 8.8*  GFRNONAA 30* 42* 49*  GFRAA 35* 48* 56*  ANIONGAP 9 5 8      Hematology Recent Labs  Lab 02/01/18 0039  02/02/18 0649 02/03/18 0602  WBC 8.4 6.5 5.1  RBC 3.75* 3.79* 3.63*  HGB 8.5* 8.4* 8.2*  HCT 28.1* 28.8* 27.7*  MCV 74.9* 76.0* 76.3*  MCH 22.7* 22.2* 22.6*  MCHC 30.2 29.2* 29.6*  RDW 17.4* 17.7* 17.7*  PLT 230 223 231    Cardiac Enzymes Recent Labs  Lab 01/29/18 2005 01/29/18 2124 01/30/18 0409  TROPONINI 4.23* 5.05* 4.11*    Recent Labs  Lab 01/29/18 0743 01/29/18 1128  TROPIPOC 0.16* 1.52*     BNP Recent Labs  Lab 01/29/18 2005  BNP 469.5*     DDimer  Recent Labs  Lab 01/29/18 0734  DDIMER 0.62*     Radiology    No results found.  Cardiac Studies   Echo 01/30/18 Study Conclusions  - Left ventricle: The cavity size was normal. Wall thickness was normal. Systolic function was mildly to moderately reduced. The estimated ejection fraction was in the range of 40% to 45%. There is hypokinesis of the apical myocardium. Doppler parameters are consistent with abnormal left ventricular relaxation (grade 1 diastolic dysfunction). Doppler parameters are consistent with high ventricular filling pressure. - Aortic valve: There  was mild regurgitation. - Pericardium, extracardiac: A trivial pericardial effusion was identified.  Impressions:  - Hypokinesis of the apex with overall mild to moderate LV dysfunction; mild diastolic dysfunction; no apical thrombus using definity; mild AI.  Patient Profile     84 yo female with PMH of PAF, tachy/brady syndrome s/p PPM, HL, TIA, anemia, COPD and dementia who presented with shortness of breath. Found to have a positive troponin.  Assessment & Plan    1. Acute systolic CHF - presented with SOB and orthopnea. BNP 469. Diuresed total 1L. Weight down 2 lb (144>>143lb). 500cc negative in past 24 hours however gained 1 lb. Scr worsen today to 1.51 from 0.96 on admission on low dose lasix 20mg  qd. Lung clear. Lasix held this morning. Pending BMET today.  - Continue low dose BB.  No ACE/ARB due to  AKI. BP improved.   2. Elevated troponin  - Peaked at 4.23. Heparin stopped for anemia and lack of chest pain. Echo with hypokinesis of the apex with overall mild to moderate LV dysfunction; mild diastolic dysfunction; no apical thrombus using definity; mild AI. - At this time, given her age and dementia will treat conservatively.  - Continue ASA, statin and BB.   3. Social situation/demential - waiting SNF bed.   4. Tachy/brady syndrome s/p PPM - Paced rhythm  5. PAF - On propafenone.  Dilt changed to BB given LV dysfunction. No OAC given hx of GI bleeds.   5. Anemia - Hx of the same.Hgb 9.6>>9.1>>8.6>>8.5>>8.4. Denies blood in stool. Check CBC in few days at facility.   Plan: awaiting SNF placement, discharge in chart.  For questions or updates, please contact [place] Please consult [email or web address] for contact info under Cardiology/STEMI.      Signed, Ena Dawley, MD  02/03/2018, 8:27 AM

## 2018-02-03 NOTE — Discharge Summary (Signed)
No change in discharge summary from yesterday. Discharge to facility when bed is available.

## 2018-02-03 NOTE — Progress Notes (Signed)
Patient slept for the rest of the night, without any complains or concerns. Stated that she is feeling well this morning.   Alzina Golda, RN

## 2018-02-04 DIAGNOSIS — R0602 Shortness of breath: Secondary | ICD-10-CM | POA: Diagnosis not present

## 2018-02-04 DIAGNOSIS — I519 Heart disease, unspecified: Secondary | ICD-10-CM | POA: Diagnosis not present

## 2018-02-04 DIAGNOSIS — I5021 Acute systolic (congestive) heart failure: Secondary | ICD-10-CM | POA: Diagnosis not present

## 2018-02-04 DIAGNOSIS — R748 Abnormal levels of other serum enzymes: Secondary | ICD-10-CM | POA: Diagnosis not present

## 2018-02-04 LAB — BASIC METABOLIC PANEL
Anion gap: 5 (ref 5–15)
BUN: 19 mg/dL (ref 6–20)
CALCIUM: 8.8 mg/dL — AB (ref 8.9–10.3)
CO2: 27 mmol/L (ref 22–32)
Chloride: 98 mmol/L — ABNORMAL LOW (ref 101–111)
Creatinine, Ser: 1.19 mg/dL — ABNORMAL HIGH (ref 0.44–1.00)
GFR calc Af Amer: 47 mL/min — ABNORMAL LOW (ref >60)
GFR, EST NON AFRICAN AMERICAN: 40 mL/min — AB (ref >60)
GLUCOSE: 89 mg/dL (ref 65–99)
POTASSIUM: 4.4 mmol/L (ref 3.5–5.1)
Sodium: 130 mmol/L — ABNORMAL LOW (ref 135–145)

## 2018-02-04 MED ORDER — FUROSEMIDE 20 MG PO TABS
20.0000 mg | ORAL_TABLET | Freq: Every day | ORAL | Status: DC
  Administered 2018-02-04 – 2018-02-05 (×2): 20 mg via ORAL
  Filled 2018-02-04 (×2): qty 1

## 2018-02-04 NOTE — Progress Notes (Addendum)
Progress Note  Patient Name: Mikayla Harper Date of Encounter: 02/04/2018  Primary Cardiologist: Virl Axe, MD   Subjective   Waiting bed placement. No chest pain or dyspnea. No new complains.  Inpatient Medications    Scheduled Meds: . aspirin EC  81 mg Oral Daily  . carvedilol  3.125 mg Oral BID WC  . donepezil  10 mg Oral QHS  . heparin injection (subcutaneous)  5,000 Units Subcutaneous Q8H  . pravastatin  40 mg Oral q1800  . propafenone  225 mg Oral Q8H   Continuous Infusions:  PRN Meds: acetaminophen, albuterol, nitroGLYCERIN, ondansetron (ZOFRAN) IV, traZODone   Vital Signs    Vitals:   02/03/18 2134 02/04/18 0300 02/04/18 0504 02/04/18 0831  BP:   (!) 102/59 100/60  Pulse:   60 62  Resp:   19   Temp: (!) 97.4 F (36.3 C)  97.6 F (36.4 C)   TempSrc: Oral  Oral   SpO2:   97%   Weight:  145 lb 8 oz (66 kg)    Height:        Intake/Output Summary (Last 24 hours) at 02/04/2018 1038 Last data filed at 02/04/2018 0800 Gross per 24 hour  Intake 1080 ml  Output 1800 ml  Net -720 ml   Filed Weights   02/02/18 0533 02/03/18 0448 02/04/18 0300  Weight: 143 lb 1 oz (64.9 kg) 142 lb 8 oz (64.6 kg) 145 lb 8 oz (66 kg)    Telemetry   a paced  - Personally Reviewed  ECG    N/A  Physical Exam   GEN: No acute distress.   Neck: No JVD Cardiac: RRR, no murmurs, rubs, or gallops. No edema.  Respiratory: Clear to auscultation bilaterally. GI: Soft, nontender, non-distended  MS: No edema; No deformity. Neuro:  Nonfocal  Psych: Normal affect   Labs    Chemistry Recent Labs  Lab 02/02/18 0936 02/03/18 0602 02/04/18 0645  NA 132* 132* 130*  K 4.1 4.4 4.4  CL 100* 98* 98*  CO2 27 26 27   GLUCOSE 95 82 89  BUN 23* 21* 19  CREATININE 1.16* 1.02* 1.19*  CALCIUM 8.9 8.8* 8.8*  GFRNONAA 42* 49* 40*  GFRAA 48* 56* 47*  ANIONGAP 5 8 5      Hematology Recent Labs  Lab 02/01/18 0039 02/02/18 0649 02/03/18 0602  WBC 8.4 6.5 5.1  RBC 3.75* 3.79*  3.63*  HGB 8.5* 8.4* 8.2*  HCT 28.1* 28.8* 27.7*  MCV 74.9* 76.0* 76.3*  MCH 22.7* 22.2* 22.6*  MCHC 30.2 29.2* 29.6*  RDW 17.4* 17.7* 17.7*  PLT 230 223 231    Cardiac Enzymes Recent Labs  Lab 01/29/18 2005 01/29/18 2124 01/30/18 0409  TROPONINI 4.23* 5.05* 4.11*    Recent Labs  Lab 01/29/18 0743 01/29/18 1128  TROPIPOC 0.16* 1.52*     BNP Recent Labs  Lab 01/29/18 2005  BNP 469.5*     DDimer  Recent Labs  Lab 01/29/18 0734  DDIMER 0.62*     Radiology    No results found.  Cardiac Studies   Echo 01/30/18 Study Conclusions  - Left ventricle: The cavity size was normal. Wall thickness was normal. Systolic function was mildly to moderately reduced. The estimated ejection fraction was in the range of 40% to 45%. There is hypokinesis of the apical myocardium. Doppler parameters are consistent with abnormal left ventricular relaxation (grade 1 diastolic dysfunction). Doppler parameters are consistent with high ventricular filling pressure. - Aortic valve: There was mild  regurgitation. - Pericardium, extracardiac: A trivial pericardial effusion was identified.  Impressions:  - Hypokinesis of the apex with overall mild to moderate LV dysfunction; mild diastolic dysfunction; no apical thrombus using definity; mild AI.  Patient Profile     84 yo female with PMH of PAF, tachy/brady syndrome s/p PPM, HL, TIA, anemia, COPD and dementia who presented with shortness of breath. Found to have a positive troponin.  Assessment & Plan    1. Acute systolic CHF - presented with SOB and orthopnea. BNP 469. Weight slightly up, I would start lasix 20 mg po daily x 2 days only. Crea 1.19. - Continue low dose BB.  No ACE/ARB due to AKI. BP improved.   2. Elevated troponin  - Peaked at 4.23. Heparin stopped for anemia and lack of chest pain. Echo with hypokinesis of the apex with overall mild to moderate LV dysfunction; mild diastolic  dysfunction; no apical thrombus using definity; mild AI. - At this time, given her age and dementia will treat conservatively.  - Continue ASA, statin and BB.   3. Social situation/demential - waiting SNF bed.   4. Tachy/brady syndrome s/p PPM - Paced rhythm  5. PAF - On propafenone.  Dilt changed to BB given LV dysfunction. No OAC given hx of GI bleeds.   5. Anemia - Hx of the same.Hgb 9.6>>9.1>>8.6>>8.5>>8.4. Denies blood in stool. Check CBC in few days at facility.   Plan: awaiting SNF placement, most probably not till tomorrow, discharge in chart.  For questions or updates, please contact [place] Please consult [email or web address] for contact info under Cardiology/STEMI.     Signed, Ena Dawley, MD  02/04/2018, 10:38 AM

## 2018-02-04 NOTE — Progress Notes (Signed)
Pt stable throughout the day, ambulatory in a hallway, vitals stable, denies CP and SOB, this point waiting for the SNF, family aware, will continue to monitor the patient  Palma Holter, RN

## 2018-02-05 ENCOUNTER — Telehealth: Payer: Self-pay | Admitting: Physician Assistant

## 2018-02-05 ENCOUNTER — Other Ambulatory Visit: Payer: Self-pay | Admitting: Cardiology

## 2018-02-05 DIAGNOSIS — R0602 Shortness of breath: Secondary | ICD-10-CM | POA: Diagnosis not present

## 2018-02-05 DIAGNOSIS — Z79899 Other long term (current) drug therapy: Secondary | ICD-10-CM

## 2018-02-05 DIAGNOSIS — I5021 Acute systolic (congestive) heart failure: Secondary | ICD-10-CM | POA: Diagnosis not present

## 2018-02-05 DIAGNOSIS — I519 Heart disease, unspecified: Secondary | ICD-10-CM | POA: Diagnosis not present

## 2018-02-05 DIAGNOSIS — R748 Abnormal levels of other serum enzymes: Secondary | ICD-10-CM | POA: Diagnosis not present

## 2018-02-05 MED ORDER — FUROSEMIDE 20 MG PO TABS: ORAL_TABLET | ORAL | 1 refills | Status: DC

## 2018-02-05 NOTE — Consult Note (Signed)
   [facility] CM Inpatient Consult   02/05/2018  [name] Nov 12, 1929 [id number]   Follow up:  Patient was denied skilled nursing placement.  Met with patient, son, Legrand [place], and daughter in law, Otila Kluver at the bedside to discuss [place] Management in post hospital follow up and community resources. They are needing options for post hospital care for the patient.  Patient is in agreement at the bedside, pleasant and cooperative.  Patient will be at her son's home at [address], [place], [place] [address].  Best contact number is for Otila Kluver at [phone].   Primary Care Provider:  Dr. Domenick Gong Consent form signed and folder with [facility] Management given with contact information.  Explained that [place] Management does not interfere with or replace any services arranged by the inpatient care management staff.   Will arrange [place] Management with [facility] social worker and RN Care Manager for follow up in transition of care.  For questions, please contact:  Natividad Brood, RN BSN [facility] Liaison  [phone] business mobile phone Toll free office [phone]

## 2018-02-05 NOTE — Care Management Note (Signed)
Case Management Note  Patient Details  Name: Mikayla Harper MRN: [id number] Date of Birth: 10/09/1929  Subjective/Objective:  Pt presented for CHF- PTA from home with family support. Plan was for SNF per PT recommendations. Per CSW- insurance denied SNF placement. CM will set the patient up with [facility].                   Action/Plan: CM did speak with son and daughter n law in regards to disposition needs. Plan will be for home- agency list provided-family had utilized [facility] in the past and wants to utilize [facility]. Referral called into Drew with [place]. Office to call the patient and SOC to begin within 24-48 hours post transition home. MD to [facility] orders. No further needs from CM at this time.   Expected Discharge Date:  02/05/18               Expected Discharge Plan:  [place]  In-House Referral:  Clinical Social Work  Discharge planning Services  CM Consult  Post Acute Care Choice:  Home Health Choice offered to:  Patient, Adult Children  DME Arranged:  N/A DME Agency:  NA  HH Arranged:  RN, Disease Management, PT, Nurse's Aide, Social Work [employer] Agency:  [place]  Status of Service:  Completed, signed off  If discussed at [employer], dates discussed:    Additional Comments:  Bethena Roys, RN 02/05/2018, 2:46 PM

## 2018-02-05 NOTE — Telephone Encounter (Signed)
TOC Patient-Please call Patient-Pt has an appointment with Richardson Dopp on 02-14-18.

## 2018-02-05 NOTE — Progress Notes (Signed)
Pt discharge instructions reviewed with pt, pt's son, and pt's daughter-in-law. Pt and family verbalize understanding and state they have no questions. Pt belongings with pt. Pt is not in distress. Pt discharged via wheelchair. Pt's family is driving her home to their house.

## 2018-02-05 NOTE — Clinical Social Work Note (Addendum)
Per SNF admissions coordinator, authorization is still pending. They are about to send updated therapy notes.  Dayton Scrape, [place] [phone]  12:52 pm Insurance authorization denied. Patient's daughter-in-law notified. Patient will return home with son and daughter-in-law today. RNCM notified and will contact family about home health. CSW paged NP to notify.  Insurance company told SNF there are no options for appeal at this time.  CSW signing off.  Dayton Scrape, [place]

## 2018-02-05 NOTE — Progress Notes (Signed)
Progress Note  Patient Name: Mikayla Harper Date of Encounter: 02/05/2018  Primary Cardiologist: Virl Axe, MD  Subjective   Feeling well. Waiting for SNF placement.   Inpatient Medications    Scheduled Meds: . aspirin EC  81 mg Oral Daily  . carvedilol  3.125 mg Oral BID WC  . donepezil  10 mg Oral QHS  . furosemide  20 mg Oral Daily  . heparin injection (subcutaneous)  5,000 Units Subcutaneous Q8H  . pravastatin  40 mg Oral q1800  . propafenone  225 mg Oral Q8H   Continuous Infusions:  PRN Meds: acetaminophen, albuterol, nitroGLYCERIN, ondansetron (ZOFRAN) IV, traZODone   Vital Signs    Vitals:   02/04/18 1707 02/04/18 1925 02/05/18 0401 02/05/18 0809  BP: 100/60 101/74 (!) 107/56 120/61  Pulse: 80 71 63 60  Resp:  18 18   Temp:  97.6 F (36.4 C) 98.1 F (36.7 C)   TempSrc:  Oral Oral   SpO2:  97% 94%   Weight:   144 lb 9.6 oz (65.6 kg)   Height:        Intake/Output Summary (Last 24 hours) at 02/05/2018 1100 Last data filed at 02/05/2018 1914 Gross per 24 hour  Intake 680 ml  Output 1801 ml  Net -1121 ml   Filed Weights   02/03/18 0448 02/04/18 0300 02/05/18 0401  Weight: 142 lb 8 oz (64.6 kg) 145 lb 8 oz (66 kg) 144 lb 9.6 oz (65.6 kg)    Telemetry    A paced - Personally Reviewed  Physical Exam   General: Well developed, well nourished, female appearing in no acute distress. Head: Normocephalic, atraumatic.  Neck: Supple without bruits, JVD. Lungs:  Resp regular and unlabored, CTA. Heart: RRR, S1, S2, no S3, S4, or murmur; no rub. Abdomen: Soft, non-tender, non-distended with normoactive bowel sounds.  Extremities: No clubbing, cyanosis, edema. Distal pedal pulses are 2+ bilaterally. Neuro: Alert and oriented X 3. Moves all extremities spontaneously. Psych: Normal affect.  Labs    Chemistry Recent Labs  Lab 02/02/18 0936 02/03/18 0602 02/04/18 0645  NA 132* 132* 130*  K 4.1 4.4 4.4  CL 100* 98* 98*  CO2 27 26 27   GLUCOSE 95  82 89  BUN 23* 21* 19  CREATININE 1.16* 1.02* 1.19*  CALCIUM 8.9 8.8* 8.8*  GFRNONAA 42* 49* 40*  GFRAA 48* 56* 47*  ANIONGAP 5 8 5      Hematology Recent Labs  Lab 02/01/18 0039 02/02/18 0649 02/03/18 0602  WBC 8.4 6.5 5.1  RBC 3.75* 3.79* 3.63*  HGB 8.5* 8.4* 8.2*  HCT 28.1* 28.8* 27.7*  MCV 74.9* 76.0* 76.3*  MCH 22.7* 22.2* 22.6*  MCHC 30.2 29.2* 29.6*  RDW 17.4* 17.7* 17.7*  PLT 230 223 231    Cardiac Enzymes Recent Labs  Lab 01/29/18 2005 01/29/18 2124 01/30/18 0409  TROPONINI 4.23* 5.05* 4.11*    Recent Labs  Lab 01/29/18 1128  TROPIPOC 1.52*     BNP Recent Labs  Lab 01/29/18 2005  BNP 469.5*     DDimer No results for input(s): DDIMER in the last 168 hours.    Radiology    No results found.  Cardiac Studies   TTE: 01/30/18  Study Conclusions  - Left ventricle: The cavity size was normal. Wall thickness was   normal. Systolic function was mildly to moderately reduced. The   estimated ejection fraction was in the range of 40% to 45%. There   is hypokinesis of the apical myocardium.  Doppler parameters are   consistent with abnormal left ventricular relaxation (grade 1   diastolic dysfunction). Doppler parameters are consistent with   high ventricular filling pressure. - Aortic valve: There was mild regurgitation. - Pericardium, extracardiac: A trivial pericardial effusion was   identified.  Impressions:  - Hypokinesis of the apex with overall mild to moderate LV   dysfunction; mild diastolic dysfunction; no apical thrombus using   definity; mild AI.  Patient Profile     84 y.o. female PMH of PAF, tachy/brady syndrome s/p PPM, HL, TIA, anemia, COPD and dementia who presented with shortness of breath. Found to have a positive troponin.  Assessment & Plan    1. Acute systolic CHF: presented with SOB and orthopnea. BNP 469. Weight slightly up over the past 2 days and was given oral lasix 20 mg po daily x 2 days. Crea 1.19  yesterday. - Continue low dose BB.  No ACE/ARB due to AKI. BP improved.   2. Elevated troponin: Peaked at 4.23. Heparin stopped for anemia and lack of chest pain. Echo with hypokinesis of the apex with overall mild to moderate LV dysfunction; mild diastolic dysfunction; no apical thrombus using definity; mild AI. -At this time, given her age and dementia will treat conservatively. - Continue ASA, statin and BB.   3. Social situation/demential:  - waiting SNF bed, hopefully today.   4.Tachy/brady syndrome s/p PPM: - Paced rhythm  5. PAF: On propafenone.Dilt changed to BB given LV dysfunction.No OAC given hx of GI bleeds.   5. Anemia:Hx of the same.Hgb 9.6>>9.1>>8.6>>8.5>>8.4. Denies blood in stool. Check CBC in few days at facility.   Dispo: awaiting SNF placement hopefully soon. She remains stable.   Signed, Reino Bellis, NP  02/05/2018, 11:00 AM  Pager # [phone]   For questions or updates, please contact [place] Please consult [email or web address] for contact info under Cardiology/STEMI.

## 2018-02-05 NOTE — Telephone Encounter (Signed)
**Note De-identified  Obfuscation** The pt is being discharged today. Will call tomorrow. 

## 2018-02-06 ENCOUNTER — Encounter: Payer: Self-pay | Admitting: *Deleted

## 2018-02-06 ENCOUNTER — Other Ambulatory Visit: Payer: Self-pay | Admitting: *Deleted

## 2018-02-06 NOTE — Telephone Encounter (Signed)
Patient's daughter in law, Otila Kluver, was contacted regarding discharge from [facility] on 02/05/2018.  Patient understands to follow up with provider Richardson Dopp, PA-c on 02/14/18 at 8:45 at [facility] in [place]. Patient understands discharge instructions? Yes Patient understands medications and regiment? Yes Patient understands to bring all medications to this visit? Yes

## 2018-02-06 NOTE — Telephone Encounter (Signed)
The pts daughter in law, Otila Kluver, states that she will call me back as she was on another call concerning the pts care.

## 2018-02-07 ENCOUNTER — Other Ambulatory Visit: Payer: Self-pay | Admitting: *Deleted

## 2018-02-07 ENCOUNTER — Encounter: Payer: Self-pay | Admitting: *Deleted

## 2018-02-07 NOTE — Patient Outreach (Signed)
[facility]) Care Management  [facility] Social Work  02/07/2018  Mikayla Harper 04/22/1930 [id number]  Subjective:  Patient is a 84 year old female who recently discharged from the hospital. Patient was evaluated for +trop and shortness of breath. Patient has been diagnosed with mild dementia and is unable to live alone. Patient rents a home next to her son and is now living with her son and daughter in law until long term care cn be identified. Acute visit to patient's home to meet briefly with patient's son and daughter in law to discuss the long term care process. Per patient's daughter in law, patient's dementia has seemed to progress, with her confusion and agitation increasing at night. Patient's daughter in law works at night and her son works during the day, leaving no one to care for her during the day. Patient has been ordered home health through [facility], however it has not started yet.  Objective:   Encounter Medications:  Outpatient Encounter Medications as of 02/06/2018  Medication Sig  . albuterol (PROVENTIL HFA;VENTOLIN HFA) 108 (90 BASE) MCG/ACT inhaler Inhale 2 puffs into the lungs every 6 (six) hours as needed for wheezing.  Marland Kitchen aspirin EC 81 MG EC tablet Take 1 tablet (81 mg total) by mouth daily.  . carvedilol (COREG) 3.125 MG tablet Take 1 tablet (3.125 mg total) by mouth 2 (two) times daily with a meal.  . donepezil (ARICEPT) 10 MG tablet Take 1 tablet daily  . furosemide (LASIX) 20 MG tablet Take one tablet 3x a week, on Monday, Wednesday and Friday.  . lovastatin (MEVACOR) 20 MG tablet Take 20 mg by mouth at bedtime.  . Multiple Vitamins-Minerals (WOMENS MULTIVITAMIN PLUS PO) Take 1 tablet by mouth daily.  . nitroGLYCERIN (NITROSTAT) 0.4 MG SL tablet Place 1 tablet (0.4 mg total) under the tongue every 5 (five) minutes x 3 doses as needed for chest pain.  Marland Kitchen oxybutynin (DITROPAN) 5 MG tablet Take 5 mg by mouth daily.   . propafenone (RYTHMOL) 225 MG tablet  TAKE 1 TABLET BY MOUTH EVERY 8 HOURS  . guaiFENesin (MUCINEX) 600 MG 12 hr tablet Take 600 mg by mouth 2 (two) times daily as needed for cough.  Marland Kitchen ipratropium-albuterol (DUONEB) 0.5-2.5 (3) MG/3ML SOLN Take 3 mLs by nebulization 4 (four) times daily.   No facility-administered encounter medications on file as of 02/06/2018.     Functional Status:  In your present state of health, do you have any difficulty performing the following activities: 01/29/2018  Hearing? N  Vision? N  Difficulty concentrating or making decisions? Y  Walking or climbing stairs? Y  Dressing or bathing? N  Doing errands, shopping? Y  Some recent data might be hidden    Fall/Depression Screening:  No flowsheet data found.  Assessment: Medicaid process for long term care discussed.  Medicaid application provided. Long term options discussed.Referral to [facility] 30 day in home care also discussed to provide assistance with ADL's as patient often refuses assistance by daughter in law or son.  Strategies explored to manage patient's dementia symptoms.  Plan: Patient's daughter in law will go to apply for long term care medicaid within the next week,   Patient referred to [facility] for their 30 day in home care.           Home visit scheduled for  02/13/18 to complete assessment and follow up on long term medicaid           process.  Sheralyn Boatman [facility] Care Management [phone]

## 2018-02-07 NOTE — Patient Outreach (Signed)
[name]) Care Management [place] Telephone Outreach PCP completes transition of care outreach post-hospital discharge [name] Discharge day 2  02/07/2018  Mikayla Harper [birth year] [id number]  Successful outgoing telephone outreach to Mikayla Harper, daughter-in-law/ caregiver of Mikayla Harper, 84 y/o female referred to [name] by [facility] Liaison RN CM after recent hospital visit May 6-13, 2019 for shortness of breath, acute CHF exacerbation, and elevated troponin levels.  Patient has history including, but not limited to, A-Fib; sCHF; HTN/ HLD; COPD; anemia; dementia; and previous TIA.  Patient was discharged home with her son and daughter-in law with home health services through [facility] agency after insurance provider denied SNF placement.  Patient provided verbal permission to speak with her son Mikayla Harper and her daughter-in-law Mikayla Harper during [name] outreach while patient was hospitalized.  HIPAA/ identity verified with patient's caregiver/ daughter-in-law Mikayla Harper today.  We discussed [place] services, and caregiver again provides verbal consent for [name] involvement in patient's care.  Today, Mikayla Harper reports that patient is "doing fair" after her recent hospital discharge; states that she is not in pain, has had no new/ recent falls, and is not having difficulty breathing.  Mikayla Harper reports that she works night shift as a Marine scientist at [employer], and was currently sleeping when I called her.  Caregiver further reports:  Medications: -- Has all medicationsand takes as prescribed;denies questions/concerns about current medications.  -- Verbalizes good general understanding of the purpose, dosing, and scheduling of medications.   -- reports patient has previously self-managed medications, however, states that patient "can not be trusted" to take medications as prescribed.  Reports that with most recent hospital discharge,  caregiver has been preparing patient's medications using weekly pill planner box, however, patient continues taking independently once the medications are prepared in pill planner box; reports that patient has already "taken medications incorrectly," despite caregiver preparation; caregiver attributes this to patient's dementia.  Discussed with caregiver option of family further managing medications by actually administering to patient, and caregiver reports this is the plan, since patient has already demonstrated that she is unable to reliably take independently once they are in pill box. -- denies patient issues with swallowing medications -- patient was recently discharged from the hospital and today we reviewed all medication changes post-hospital discharge, according to discharge instructions provided to patient/ family; discussed need for formal medication review, and caregiver agrees to this at time of [name]. -- encouraged caregiver to take all medications to upcoming scheduled PCP appointment for PCP review; caregiver agrees to do so  Home health [name]) services: -- [name] services for PT, RN, bath aide in place through [name] -- confirms that [name] team has contacted her and that she has phone number for agency;  Reports she plans to contact home health team after our phone conversation   Provider appointments: -- All upcoming provider appointments were reviewed with patient today:  PCP office visit scheduled for Monday 02/12/18; caregiver reports plans to transport and attend this appointment with patient; discussed with caregiver to discuss all concerns with PCP at time of office visit, and encouraged her to obtain and keep post-office visit instructions for review at time of [name]; caregiver agrees to do so -- scheduled cardiology office visit Wednesday Feb 14, 2018: caregiver again states she will provide transportation and will attend  visit with patient  Safety/ Mobility/ Falls: -- denies new/ recent  falls post-hospital discharge, however, reports multiple previous falls, most without serious injury, aside from bruising, minor swelling -- assistive devices: caregiver reports patient "will not use" and that she believes previous falls have been a result of patient "going too fast," and possibly having balance issues due to "clumsiness around moving too fast without thinking about what she is doing." -- general fall risks/ prevention education discussed with caregiver today  Social/ Community Resource needs: -- caregiver reports supportive family members that assist with care needs as indicated; reports patient's care has become increasingly difficult, as patient's dementia has "progressed significantly;" states that patient has become more and more child-like, argumentative, with "really bad sun-downer symptoms," reporting that patient "always has days and nights mixed up."  States patient essentially "cares for herself," however, reports that patient's self-care is minimal; states patient "has not had a real bath in 2 years;" stating that patient "only cleans up at the bathroom sink," adding that caregiver does not think "she does a very good job" with self-care/ hygiene. -- reports that patient previously lived "right beside" she and patient's son; patient has now moved in with caregivers (son and daughter-in-law) "for safety reasons," as caregiver states "it is obvious that she can no longer live by herself."  Caregivers both work full time, and patient is alone during these periods; daughter-in-law is physically present at the home with patient during day time hours, but is usually asleep due to working night shift; son is with patient during night time hours -- confirms that she has spoken with [name] for care assistance options; ALF/ SNF placement, and also confirms that she has an active FL-2, which was previously  signed by patient's PCP; verbalizes plans to obtain updated FL-2 at time of upcoming PCP appointment -- family provides transportation for patient to all provider appointments, errands, etc -- discussed option of daughter-in-law exploring options around obtaining FMLA through her employer; caregiver reports she is looking into this  Advanced Directive (AD) Planning:   -- reports does not currently have exisisting AD in place for HCPOA or Living will, however, confirms that patient has an active DNR, which is posted at residence. -- currently exploring creation of HCPOA and Living will Documents to include patient's caregivers -- basics of creating Advanced Directives discussed with caregiver  Self-health management of chronic disease state of CHF: -- denies that patient has had issues around breathing status post-hospital discharge -- briefly discussed signs/ symptoms yellow zone CHF with caregiver, who as a nurse is familiar with signs/ symptoms to report/ take action on -- patient currently not monitoring/ recording daily weights-- reports that caregiver plans to continue reinforcing need for patient to do this; weight gain guidelines in Harper of CHF discussed with caregiver to report weight gain of > 3 lbs overnight/ 5 lbs in one week to care providers; also discussed with caregiver possibility of discussing with PCP at upcoming office visit to develop a specific action plan for home management for periods of weight gain/ yellow zone symptoms -- reports taking diuretic as newly ordered M-W-F -- using nebulizers and inhalers as needed; no home O2 at present  Patient's caregiver denies further issues, concerns, or problems today.  I confirmed that caregiver has my direct phone number, the main [facility] CM office phone number, and the [facility] CM 24-hour nurse advice phone number should issues arise prior to next scheduled [place] outreach, with initial home visit scheduled today for next week.   Encouraged caregiver to contact  me directly if needs, questions, issues, or concerns arise prior to next scheduled outreach; caregiver agreed to do so.  Plan:  Patient will take all medications as prescribed and will attend all provider appointments post-hospital discharge  Caregiver or patient will promptly notify care providers for any new concerns/ issus/ problems that arise  Patient will actively participate in home health services as ordered  Caregiver/ patient will maintain communication with [facility] CSW regarding patient's community resource/ level of care needs  I will make patient's PCP aware of [facility] Community CM involvement in patient's care (will send barrier letter)  [facility] outreach to continue with scheduled initial home visit next week   [facility] CM Care Plan Problem One     Most Recent Value  Care Plan Problem One  Risk for hospital readmission related to/ as evidenced by recent hospitalization May 6-13, 2019 for CHF exacerbation  Role Documenting the Problem One  Care Management Coordinator  Care Plan for Problem One  Active  [facility] Long Term Goal   Over the next 31 days, patient will not experience hospital re-admission, as evidenced by patient/ caregiver reporting and review of EMR, during [place] RN CM outreach  [facility] Long Term Goal Start Date  02/07/18  Interventions for Problem One Long Term Goal  Discussed with patient's caregiver patient's current clinical status post-hospital discharge,  reviewed hospital discharge instructions, and medication changes,  discussed scheduled provider appointments post-hospital discharge and ensured that family will provide transportation to scheduled appointments,  scheduled initial home visit with caregiver for next week  [facility] CM Short Term Goal #1   Over the next 30 days, patient will actively participate in home health services as ordered post-hospital discharge, as evidenced by caregiver reporting and collaboration with home health  team, as indicated, during [place] outreach   [facility] CM Short Term Goal #1 Start Date  02/07/18  Interventions for Short Term Goal #1  Discussed home health services orders post-hospital discharge,  confirmed that caregiver has heard from home health team post-hospital discharge,  encouraged patient's active participation in all services and confirmed that caregiver has contact information for home health team,  discussed difference between home health and care management services with caregiver  [facility] CM Short Term Goal #2   Over the next 30 days, patient will take medications as prescribed post-hospital discharge, as evidenced by caregiver reporting during [place] RN CM  [facility] CM Short Term Goal #2 Start Date  02/07/18  Interventions for Short Term Goal #2  Discussed patient's/ caregiver's current medication management, and encouraged caregiver to begin managing/ supervising patient's medication administration     I appreciate the opportunity to participate in Ms. Harding's care,  Oneta Rack, RN, BSN, [employer] Coordinator [facility] Care Management  [phone]

## 2018-02-08 DIAGNOSIS — I5021 Acute systolic (congestive) heart failure: Secondary | ICD-10-CM | POA: Diagnosis not present

## 2018-02-08 DIAGNOSIS — I4891 Unspecified atrial fibrillation: Secondary | ICD-10-CM | POA: Diagnosis not present

## 2018-02-08 DIAGNOSIS — J441 Chronic obstructive pulmonary disease with (acute) exacerbation: Secondary | ICD-10-CM | POA: Diagnosis not present

## 2018-02-08 DIAGNOSIS — Z7982 Long term (current) use of aspirin: Secondary | ICD-10-CM | POA: Diagnosis not present

## 2018-02-08 DIAGNOSIS — G459 Transient cerebral ischemic attack, unspecified: Secondary | ICD-10-CM | POA: Diagnosis not present

## 2018-02-08 DIAGNOSIS — K5730 Diverticulosis of large intestine without perforation or abscess without bleeding: Secondary | ICD-10-CM | POA: Diagnosis not present

## 2018-02-08 DIAGNOSIS — Z950 Presence of cardiac pacemaker: Secondary | ICD-10-CM | POA: Diagnosis not present

## 2018-02-08 DIAGNOSIS — I110 Hypertensive heart disease with heart failure: Secondary | ICD-10-CM | POA: Diagnosis not present

## 2018-02-08 DIAGNOSIS — F0390 Unspecified dementia without behavioral disturbance: Secondary | ICD-10-CM | POA: Diagnosis not present

## 2018-02-12 DIAGNOSIS — I509 Heart failure, unspecified: Secondary | ICD-10-CM | POA: Diagnosis not present

## 2018-02-12 DIAGNOSIS — J441 Chronic obstructive pulmonary disease with (acute) exacerbation: Secondary | ICD-10-CM | POA: Diagnosis not present

## 2018-02-12 DIAGNOSIS — G459 Transient cerebral ischemic attack, unspecified: Secondary | ICD-10-CM | POA: Diagnosis not present

## 2018-02-12 DIAGNOSIS — N183 Chronic kidney disease, stage 3 (moderate): Secondary | ICD-10-CM | POA: Diagnosis not present

## 2018-02-12 DIAGNOSIS — I495 Sick sinus syndrome: Secondary | ICD-10-CM | POA: Diagnosis not present

## 2018-02-12 DIAGNOSIS — J449 Chronic obstructive pulmonary disease, unspecified: Secondary | ICD-10-CM | POA: Diagnosis not present

## 2018-02-12 DIAGNOSIS — F0390 Unspecified dementia without behavioral disturbance: Secondary | ICD-10-CM | POA: Diagnosis not present

## 2018-02-12 DIAGNOSIS — I1310 Hypertensive heart and chronic kidney disease without heart failure, with stage 1 through stage 4 chronic kidney disease, or unspecified chronic kidney disease: Secondary | ICD-10-CM | POA: Diagnosis not present

## 2018-02-12 DIAGNOSIS — Z950 Presence of cardiac pacemaker: Secondary | ICD-10-CM | POA: Diagnosis not present

## 2018-02-12 DIAGNOSIS — I4891 Unspecified atrial fibrillation: Secondary | ICD-10-CM | POA: Diagnosis not present

## 2018-02-12 DIAGNOSIS — E7800 Pure hypercholesterolemia, unspecified: Secondary | ICD-10-CM | POA: Diagnosis not present

## 2018-02-12 DIAGNOSIS — I110 Hypertensive heart disease with heart failure: Secondary | ICD-10-CM | POA: Diagnosis not present

## 2018-02-12 DIAGNOSIS — K5730 Diverticulosis of large intestine without perforation or abscess without bleeding: Secondary | ICD-10-CM | POA: Diagnosis not present

## 2018-02-12 DIAGNOSIS — I480 Paroxysmal atrial fibrillation: Secondary | ICD-10-CM | POA: Diagnosis not present

## 2018-02-12 DIAGNOSIS — I5021 Acute systolic (congestive) heart failure: Secondary | ICD-10-CM | POA: Diagnosis not present

## 2018-02-12 DIAGNOSIS — Z7982 Long term (current) use of aspirin: Secondary | ICD-10-CM | POA: Diagnosis not present

## 2018-02-13 ENCOUNTER — Other Ambulatory Visit: Payer: Self-pay | Admitting: *Deleted

## 2018-02-13 ENCOUNTER — Encounter: Payer: Self-pay | Admitting: *Deleted

## 2018-02-13 NOTE — Patient Outreach (Signed)
Mikayla Harper) Care Management  [place] Initial Home Visit PCP completes transition of care outreach post-hospital discharge Post-hospital discharge day 8  02/13/2018  Mikayla Harper September 21, 1930 [id number]  Mikayla Harper is a 84 y.o. female referred to [place] by [facility] Liaison RN CM after recent hospital visit May 6-13, 2019 for shortness of breath, acute CHF exacerbation, and elevated troponin levels.  Patient has history including, but not limited to, A-Fib; sCHF; HTN/ HLD; COPD; anemia; dementia; and previous TIA.  Patient was discharged home with her son and daughter-in law with home health services through [facility] agency after insurance provider denied SNF placement. HIPAA/ identity verified with patient in person today at her son/ daughter-in-law's home, and her daughter-in-law Otila Kluver, who is a Marine scientist at [employer] [facility] [employer] is present for today's home visit and actively participates in all aspects of visit.  [facility] CSW Chrystal is also present for today's visit; pleasant 70 minute home visit.  Subjective: "I am okay with going to a new place to live- I don't want to be a burden on my son and daughter-in-law; I am looking forward to maybe meeting some new friends."  Assessment:  Declan is recuperating well at her family's home post-hospital discharge and has an excellent family support system; family is actively working with [facility] CSW to explore possibility of patient placement in ALF vs. SNF to meet level of care needs for patient, and patient is agreeable to this plan.  Maira has experienced several recent falls without injury, and is a high fall risk.  Yosselin's daughter-in-law is a Marine scientist and has a good understanding of self-health strategies for CHF, and has assumed management of all of patient's daily care needs for medications, ADL's, and provider appointments post-most recent hospital discharge.     Today, patient reports that she is "doing great," and she  denies pain and is no obvious/ apparent distress throughout visit.  Reports 2 new falls over weekend, without injury.  Patient/ caregiver further report:  Medications: -- Has all medicationsand takes as prescribed;denies questions/concerns about current medications.  -- Caregiver verbalizes good general understanding of the purpose, dosing, and scheduling of medications, and confirms that she has assumed all of medication management for patient post-hospital visit, as previously reported that patient was becoming confused around medications and possibly not taking correctly. Caregiver uses weekly pill planner box and is now administering all medications to patient -- Patient was recently discharged from hospital and all medications were thoroughly reviewed in person with caregiver today, including discussion on medication instructions post- PCP office visit yesterday. -- Caregiver reports that she will be transporting patient to cardiology provider office visit tomorrow, and verbalizes plans to discuss/ review all medications with cardiology provider, as she has noticed that patient's BP's have been dropping from 120/80- 90's/ 60's after she takes medications-- this was enocuraged  Home health [facility]) services: -- [place] services for PT, RN, bath aide in place through [facility]-- all have visited with patient and remain active -- patient was encouraged to actively participate in home health services as ordered, and she verbalizes agreement  Provider appointments: -- All recent and upcoming provider appointments were reviewed with patient and caregiver today:  attended PCP office visit yesterday 02/12/18; caregiver reports plans to transport and attend all upcoming scheduled appointments with patient;  -- scheduled cardiology office visit tomorrow Feb 14, 2018  Safety/ Mobility/ Falls: -- previously denied new/ recent falls post-hospital discharge, however, patient and  caregiver  report 2 new falls over last weekend, stating that patient "got up too fast" and either "lost balance" or "had low BP," although patient denies signs/ symptoms around hypotension -- report that patient has "slight foot drop" in (R) foot, which may have contributed to falling episodes over weekend -- patient does not routinely use assistive devices, and her gait with ambulation around home today is steady and purposeful, and there are no obvious fall risks/ hazards noted in home environment; reports has a can and a walker but "rarely uses." -- fall risks/ prevention education provided and discussed today-- patient was encouraged to move slowly with position changes and with ambulation  Social/ Community Resource needs: -- supportive family members that assist with care needs/ provide transportation; both caregivers work full-time, son during day, daughter-in-law on night shift -- [facility] CSW actively involved in patient's community resource needs/ possible placement in ALF vs. SNF -- daughter-in-law reports that she has now obtained FMLA through her employer; together [place] and I discussed need for caregiver self-care and to obtain and utilize assistance with patient care, as caregiver reports fatigue from working full time and caring for patient full time as well.  Caregiver receptive and agrees.  Advanced Directive (AD) Planning:   -- reports patient has now made her son her 66; again confirms that patient has an active DNR, which is posted at residence.  Self-health management of chronic disease state of CHF: -- denies that patient has had issues around breathing status post-hospital discharge- reports occasional episodes shortness of breath, primarily with activity-- resolves within "a few minutes" with rest/ use of rescue inhalers -- discussed signs/ symptoms yellow zone CHF with patient / caregiver,  -- patient has begun monitoring/ recording daily weights-- provided printed EMMI  education on rationale/ value of daily weight monitoring, and extensively discussed/ reiterated weight gain guidelines in setting of CHF:  Weight range post-hospital discharge 141-145 lbs, with weight today of 141.6 lbs -- discussed with caregiver discussing with cardiology provider at upcoming office visit development of specific action plan for home management for periods of weight gain/ yellow zone symptoms-- caregiver reports she is planing to do -- reports taking diuretic as newly ordered M-W-F -- caregiver reports patient refusing to use nebulizers, but uses rescue inhaler "3-4 times per day." No home O2 at present -- Patient's caregiver is a nurse and verbalizes a good understanding of daily strategies for CHF self-health management  Patient and caregiver denies further issues, concerns, or problems today. I confirmed that caregiver hasmy direct phone number, the main [facility] CM office phone number, and the [facility] CM 24-hour nurse advice phone number should issues arise prior to next scheduled [place] outreach, with telephone call scheduled today in 2 weeks.  Encouraged caregiver to contact me directly if needs, questions, issues, or concerns arise prior to next scheduled outreach; caregiver agreed to do so.  Objective:    BP (!) 98/50   Pulse (!) 54   Resp 16   Ht 1.626 m (5\' 4" )   Wt 141 lb 9.6 oz (64.2 kg)   SpO2 93%   BMI 24.31 kg/m   Review of Systems  Constitutional: Positive for weight loss.       Recent weight loss due to prescribed therapy during and post-hospitalization  Respiratory: Positive for cough and shortness of breath. Negative for wheezing.        Reports/ demonstrates occasional non-productive cough; reports occasional shortness of breath with activity- none exhibited today  during home visit with ambulation around patient's home  Cardiovascular: Negative.  Negative for chest pain, palpitations and leg swelling.  Gastrointestinal: Negative.  Negative for  abdominal pain, nausea and vomiting.  Genitourinary: Positive for frequency and urgency.       Diuretic therapy  Musculoskeletal: Positive for falls.       Reports 2 falls without injury over past weekend-- patient and caregiver both report this was due to patient "moving too fast" with position changes; caregiver questions if some of patient's medications are dropping BP too low; patient denies dizziness  Neurological: Negative.  Negative for dizziness.  Psychiatric/Behavioral: Positive for memory loss. Negative for depression. The patient is not nervous/anxious.        Caregiver describes impaired memory; states patient has mild dementia   Physical Exam  Constitutional: She is oriented to person, place, and time. She appears well-developed and well-nourished. No distress.  Cardiovascular: Regular rhythm, normal heart sounds and intact distal pulses. Bradycardia present.  Pulses:      Radial pulses are 2+ on the right side, and 2+ on the left side.       Dorsalis pedis pulses are 2+ on the right side, and 2+ on the left side.  Respiratory: Effort normal and breath sounds normal. No respiratory distress. She has no wheezes. She has no rales.  On Room air  GI: Soft. Bowel sounds are normal.  Musculoskeletal: She exhibits no edema.  Neurological: She is alert and oriented to person, place, and time.  Skin: Skin is warm and dry.  Psychiatric: She has a normal mood and affect. Her behavior is normal. Thought content normal.   Encounter Medications:   Outpatient Encounter Medications as of 02/13/2018  Medication Sig Note  . albuterol (PROVENTIL HFA;VENTOLIN HFA) 108 (90 BASE) MCG/ACT inhaler Inhale 2 puffs into the lungs every 6 (six) hours as needed for wheezing. 02/13/2018: Per report of caregiver, patient is using rescue inhaler x 3-4 times daily; refuses to use nebulizer- uses rescue inhaler instead  . aspirin EC 81 MG EC tablet Take 1 tablet (81 mg total) by mouth daily.   . carvedilol  (COREG) 3.125 MG tablet Take 1 tablet (3.125 mg total) by mouth 2 (two) times daily with a meal.   . donepezil (ARICEPT) 10 MG tablet Take 1 tablet daily   . ferrous sulfate 325 (65 FE) MG EC tablet Take 325 mg by mouth 2 (two) times daily with a meal. 02/13/2018: Caregiver reports this medication was started 02/12/18 during PCP office visit  . furosemide (LASIX) 20 MG tablet Take one tablet 3x a week, on Monday, Wednesday and Friday.   Marland Kitchen guaiFENesin (MUCINEX) 600 MG 12 hr tablet Take 600 mg by mouth 2 (two) times daily as needed for cough. 02/13/2018: Uses prn  . lovastatin (MEVACOR) 20 MG tablet Take 20 mg by mouth at bedtime.   . Multiple Vitamins-Minerals (WOMENS MULTIVITAMIN PLUS PO) Take 1 tablet by mouth daily.   . nitroGLYCERIN (NITROSTAT) 0.4 MG SL tablet Place 1 tablet (0.4 mg total) under the tongue every 5 (five) minutes x 3 doses as needed for chest pain. 02/13/2018: Has not needed recently per patient and caregiver report  . oxybutynin (DITROPAN) 5 MG tablet Take 5 mg by mouth daily.    . propafenone (RYTHMOL) 225 MG tablet TAKE 1 TABLET BY MOUTH EVERY 8 HOURS   . ipratropium-albuterol (DUONEB) 0.5-2.5 (3) MG/3ML SOLN Take 3 mLs by nebulization 4 (four) times daily. 02/13/2018: Per report of caregiver 02/13/18- patient  refuses to use; uses rescue inhaler instead   No facility-administered encounter medications on file as of 02/13/2018.    Functional Status:   In your present state of health, do you have any difficulty performing the following activities: 02/13/2018 01/29/2018  Hearing? N N  Vision? N N  Difficulty concentrating or making decisions? Tempie Donning  Walking or climbing stairs? Y Y  Dressing or bathing? N N  Doing errands, shopping? Tempie Donning  Preparing Food and eating ? Y -  Comment denies issues with eating; requires assistance with meal preparation -  Using the Toilet? N -  In the past six months, have you accidently leaked urine? Y -  Comment per caregiver, when patient coughs she is  occasionally incontinent -  Do you have problems with loss of bowel control? Y -  Comment per caregiver, when patient coughs she is occasionally incontinent -  Managing your Medications? Y -  Comment Family manages and administers medications -  Managing your Finances? Y -  Housekeeping or managing your Housekeeping? Y -  Some recent data might be hidden   Fall/Depression Screening:    Fall Risk  02/13/2018 02/07/2018 02/06/2018  Falls in the past year? - Yes Yes  Comment - per daughter-n-law Otila Kluver, whom patient provided verbal permission to speak with -  Number falls in past yr: - 2 or more 2 or more  Comment - per daughter-n-law Otila Kluver, whom patient provided verbal permission to speak with -  Injury with Fall? - Yes Yes  Comment - per daughter-n-law Otila Kluver, whom patient provided verbal permission to speak with -  Risk Factor Category  - High Fall Risk -  Risk for fall due to : Impaired balance/gait;Medication side effect Impaired balance/gait;History of fall(s) -  Follow up Falls evaluation completed;Falls prevention discussed;Education provided Falls prevention discussed;Education provided Falls prevention discussed  Comment - with daughter-n-law Otila Kluver, whom patient provided verbal permission to speak with -   PHQ 2/9 Scores 02/13/2018  PHQ - 2 Score 0   Plan:  Patient will take all medications as prescribed and will attend all provider appointments post-hospital discharge  Caregiver or patient will promptly notify care providers for any new concerns/ issus/ problems that arise  Patient will actively participate in home health services as ordered  Caregiver/ patient will maintain communication with [facility] CSW regarding patient's community resource/ level of care needs  I will share today's [facility] CCM home visit note and care plan with patient's PCP   [facility] Community CM outreach to continue with scheduled phone call in 2 weeks  [facility] CM Care Plan Problem One     Most Recent Value  Care  Plan Problem One  Risk for hospital readmission related to/ as evidenced by recent hospitalization May 6-13, 2019 for CHF exacerbation  Role Documenting the Problem One  Care Management Coordinator  Care Plan for Problem One  Active  [facility] Long Term Goal   Over the next 31 days, patient will not experience hospital re-admission, as evidenced by patient/ caregiver reporting and review of EMR, during [place] RN CM outreach  [facility] Long Term Goal Start Date  02/07/18  Interventions for Problem One Long Term Goal  Initial home visit completed with patient and caregiver,  discussed yesterday's PCP office visit, completed in-home medication review,  reviewed upcoming provider appointments   [facility] CM Short Term Goal #1   Over the next 30 days, patient will actively participate in home health services as ordered post-hospital discharge, as evidenced by  caregiver reporting and collaboration with home health team, as indicated, during [place] outreach   [facility] CM Short Term Goal #1 Start Date  02/07/18  Interventions for Short Term Goal #1  confirmed that home health team is active and reviewed previous and upcoming home health visits,  encouraged patient to actively participate in home health services as ordered post-hospital discharge  [facility] CM Short Term Goal #2   Over the next 30 days, patient will take medications as prescribed post-hospital discharge, as evidenced by caregiver reporting during [place] RN CM  [facility] CM Short Term Goal #2 Start Date  02/07/18  Interventions for Short Term Goal #2  Reviewed all current medications with caregiver and patient,  provided positive reinforcement for caregiver assuming medication management for patient,  encouraged caregiver to discuss concerns with current medications and BP's with cardiology provider during tomorrow's office visit    [facility] CM Care Plan Problem Two     Most Recent Value  Care Plan Problem Two  High Fall Risk related to/ as evidenced by multiple  recent falls  Role Documenting the Problem Two  Care Management [place] for Problem Two  Active  Interventions for Problem Two Long Term Goal   Using teach back method, fall risks/ prevention education provided to patient and caregiver,  encouraged regular use of assistive devices and encouraged patient to move slowly and deliberately with ambulation,  falls assessment completed  [facility] Long Term Goal  Over the next 60 days, patient/ caregiver will verbalize 3 strategies for fall prevention, as evidenced by patient/ caregiver reporting of same during [place] RN CM outreach  [place] Term Goal Start Date  02/13/18     Oneta Rack, RN, BSN, [place] Coordinator [facility] Care Management  [phone]

## 2018-02-13 NOTE — Progress Notes (Signed)
Cardiology Office Note:    Date:  02/14/2018   ID:  Mikayla Harper, DOB 10-26-1929, MRN [id number]  PCP:  Haywood Pao, MD  Cardiologist:  Virl Axe, MD   Referring MD: Haywood Pao, MD   Chief Complaint  Patient presents with  . Hospitalization Follow-up    s/p MI, CHF    History of Present Illness:    Mikayla Harper is a 84 y.o. female with paroxysmal atrial fibrillation, tachybradycardia syndrome status post pacemaker, prior GI bleeding secondary to AVMs, prior TIA, dementia.  She has been taken off of anticoagulation secondary to recurrent GI bleeding.  She has maintained sinus rhythm on propafenone.  Last seen by Dr. Caryl Harper in May 2018.    She was admitted 5/6-5/13 with acute systolic heart failure in the setting of non-STEMI.  Peak troponin was 5.05.  Echocardiogram demonstrated EF 40-45 with apical hypokinesis and mild diastolic dysfunction.  It was decided to treat her conservatively given her dementia.  Of note, she has chronic anemia which worsened slightly during her admission.  This prompted discontinuation of heparin.  She was initially set up for discharge to SNF.  However, this was denied by insurance and she was discharged to home with her daughter.  CHADS2-VASc=6 (female, age x 2, CVA, CHF).     Ms. Mikayla Harper returns for post hospital follow up.  She is here with her daughter-in-law, who is a Marine scientist. She has been monitoring vital signs and her weights.  Her weights have been fairly stable.  She denies any chest pain, syncope, paroxysmal nocturnal dyspnea.  Her breathing remains improved.  She has noted some mild ankle edema that is unchanged.  She does get dizzy with standing and has a hx of falls.  She denies any bleeding issues.  She did see her PCP yesterday.  Labs were drawn.    Prior CV studies:   The following studies were reviewed today:  Echocardiogram 01/30/2018 EF 40-45, apical hypokinesis, grade 1 diastolic dysfunction, mild AI, trivial  effusion  Carotid US 11/02/2011 R 40-59; L 0-39 >> follow-up 1 year  Echo 10/20/2011 Mild focal basal septal hypertrophy, EF 55, grade 1 diastolic dysfunction, mild AI  Past Medical History:  Diagnosis Date  . Anemia   . Carpal tunnel syndrome of left wrist 12/20/2010   left hand  . Dementia   . Hyperlipidemia    on Mevacor  . Influenza-like illness 2009   H1N1 PCR negative  . Osteopenia   . Pacemaker - [facility]    Implant Jan 2013  . PAF (paroxysmal atrial fibrillation) ([place])    previously on propafenone  . Sick sinus syndrome ([place])   . TIA (transient ischemic attack) Dec 2012  . Tuberculosis     hx of over 50 years ago "  . Urinary tract infection    Surgical Hx: The patient  has a past surgical history that includes Back surgery (1976); Other surgical history (In the late 1960's); Other surgical history; Abdominal hysterectomy; Insert / replace / remove pacemaker (10/21/2011); Esophagogastroduodenoscopy (08/01/2012); Colonoscopy (08/01/2012); Cardioversion (N/A, 11/13/2012); permanent pacemaker insertion (N/A, 10/21/2011); and Lumbar fusion (N/A).   Current Medications: Current Meds  Medication Sig  . albuterol (PROVENTIL HFA;VENTOLIN HFA) 108 (90 BASE) MCG/ACT inhaler Inhale 2 puffs into the lungs every 6 (six) hours as needed for wheezing.  Marland Kitchen aspirin EC 81 MG EC tablet Take 1 tablet (81 mg total) by mouth daily.  . carvedilol (COREG) 3.125 MG tablet Take 1 tablet (3.125  mg total) by mouth 2 (two) times daily with a meal.  . donepezil (ARICEPT) 10 MG tablet Take 1 tablet daily  . ferrous sulfate 325 (65 FE) MG EC tablet Take 325 mg by mouth 2 (two) times daily with a meal.  . furosemide (LASIX) 20 MG tablet Take one tablet 3x a week, on Monday, Wednesday and Friday.  Marland Kitchen guaiFENesin (MUCINEX) 600 MG 12 hr tablet Take 600 mg by mouth daily.   Marland Kitchen ipratropium-albuterol (DUONEB) 0.5-2.5 (3) MG/3ML SOLN Take 3 mLs by nebulization 4 (four) times daily.  Marland Kitchen lovastatin (MEVACOR) 20 MG  tablet Take 20 mg by mouth at bedtime.  . mirtazapine (REMERON) 15 MG tablet Take 1 tablet by mouth at bedtime.  . Multiple Vitamins-Minerals (WOMENS MULTIVITAMIN PLUS PO) Take 1 tablet by mouth daily.  . nitroGLYCERIN (NITROSTAT) 0.4 MG SL tablet Place 1 tablet (0.4 mg total) under the tongue every 5 (five) minutes x 3 doses as needed for chest pain.  Marland Kitchen oxybutynin (DITROPAN) 5 MG tablet Take 5 mg by mouth daily.   . [DISCONTINUED] propafenone (RYTHMOL) 225 MG tablet TAKE 1 TABLET BY MOUTH EVERY 8 HOURS     Allergies:   Lisinopril   Social History   Tobacco Use  . Smoking status: Former Smoker    Packs/day: 1.00    Years: 30.00    Pack years: 30.00    Types: Cigarettes    Last attempt to quit: 10/05/1988    Years since quitting: 29.3  . Smokeless tobacco: Never Used  Substance Use Topics  . Alcohol use: No  . Drug use: No     Family Hx: The patient's family history includes COPD in her sister; Cancer in her daughter; Dementia in her mother; Emphysema in her father; Heart disease in her brother and father; Parkinson's disease in her sister.  ROS:   Please see the history of present illness.    Review of Systems  Constitution: Positive for malaise/fatigue.  HENT: Positive for hearing loss.   Cardiovascular: Positive for dyspnea on exertion.  Respiratory: Positive for cough and shortness of breath.   Hematologic/Lymphatic: Bruises/bleeds easily.   All other systems reviewed and are negative.   EKGs/Labs/Other Test Reviewed:    EKG:  EKG is  ordered today.  The ekg ordered today demonstrates normal sinus rhythm, heart rate 63, leftward axis, T wave inversions 1, 2, 3, aVF, V3-V6, QTC 450  Recent Labs: 01/29/2018: B Natriuretic Peptide 469.5 02/03/2018: Hemoglobin 8.2; Platelets 231 02/04/2018: BUN 19; Creatinine, Ser 1.19; Potassium 4.4; Sodium 130   Recent Lipid Panel Lab Results  Component Value Date/Time   CHOL 204 (H) 01/30/2018 04:09 AM   TRIG 57 01/30/2018 04:09 AM    HDL 87 01/30/2018 04:09 AM   CHOLHDL 2.3 01/30/2018 04:09 AM   LDLCALC 106 (H) 01/30/2018 04:09 AM    Physical Exam:    VS:  BP 124/68   Pulse 63   Ht 5\' 3"  (1.6 m)   Wt 143 lb (64.9 kg)   SpO2 99%   BMI 25.33 kg/m     Wt Readings from Last 3 Encounters:  02/14/18 143 lb (64.9 kg)  02/13/18 141 lb 9.6 oz (64.2 kg)  02/05/18 144 lb 9.6 oz (65.6 kg)     Physical Exam  Constitutional: She is oriented to person, place, and time. She appears well-developed and well-nourished. No distress.  HENT:  Head: Normocephalic and atraumatic.  Neck: Neck supple. No JVD present.  Cardiovascular: Normal rate, regular rhythm, S1 normal,  S2 normal and normal heart sounds.  No murmur heard. Pulmonary/Chest: Effort normal. She has no rales.  Abdominal: Soft. There is no hepatomegaly.  Musculoskeletal: She exhibits no edema.  Neurological: She is alert and oriented to person, place, and time.  Skin: Skin is warm and dry.    ASSESSMENT & PLAN:    Chronic systolic heart failure (HCC) EF 40-45.  She is NYHA 2.  Her volume appears stable.  She is currently only on beta-blocker therapy with Carvedilol.  She has a listed intolerance to Lisinopril.  Her BP at home is usually in the 100s/70s.  She also has a hx of falls and dizziness with standing that is unchanged.  I am concerned that she will not tolerate the addition of ARB (Losartan).  At this point, I recommend remaining on Carvedilol and Furosemide.  Non-STEMI (non-ST elevated myocardial infarction) (HCC) Troponin peaked at 5.  There was apical HK on her echocardiogram and her ECG is consistent with evolving anterolateral MI. She denies angina.  Continue ASA, beta-blocker, statin.  Persistent atrial fibrillation (HCC) Recent hx of MI.  She has systolic CHF.  I discussed her case with Dr. Caryl Harper.  We have decided to stop her Propafenone.  If she has recurrent, symptomatic AFib, will need to start her on Amiodarone.  -DC Propafenone    Essential hypertension The patient's blood pressure is controlled on her current regimen.  Continue current therapy.    Pacemaker-[employer] FU with EP as planned.  Anemia, unspecified type Continue follow up with PCP.   Dispo:  Return in about 3 weeks (around 03/06/2018) for Scheduled Follow Up w/ Dr. Caryl Harper.   Medication Adjustments/Labs and Tests Ordered: Current medicines are reviewed at length with the patient today.  Concerns regarding medicines are outlined above.  Tests Ordered: Orders Placed This Encounter  Procedures  . EKG 12-Lead   Medication Changes: No orders of the defined types were placed in this encounter.   Signed, Richardson Dopp, PA-C  02/14/2018 5:07 PM    [facility] [address], [place], [place]  [address] Phone: [phone]; Fax: [phone]

## 2018-02-13 NOTE — Patient Outreach (Signed)
[facility]) Care Management  [facility] Social Work  02/13/2018  Mikayla Harper [birth year] [id number]  Subjective:  Patient is a 84 year old female, recent discharge from the hospitl . Patient was evaluated for +trop and shortness of breath. Patient has been diagnosed with mild dementia. Patient currently lives with her son and daughter in law who is a Marine scientist. Patient's daughter in law is the primary caregiver.. Patient's daughter in law, discussed experiencing increased fatigue with providing care for patient do to it's rapid progression and would like assistance with identifying long term care for patient.   Objective:   Encounter Medications:  Outpatient Encounter Medications as of 02/13/2018  Medication Sig Note  . albuterol (PROVENTIL HFA;VENTOLIN HFA) 108 (90 BASE) MCG/ACT inhaler Inhale 2 puffs into the lungs every 6 (six) hours as needed for wheezing.   Marland Kitchen aspirin EC 81 MG EC tablet Take 1 tablet (81 mg total) by mouth daily.   . carvedilol (COREG) 3.125 MG tablet Take 1 tablet (3.125 mg total) by mouth 2 (two) times daily with a meal.   . donepezil (ARICEPT) 10 MG tablet Take 1 tablet daily   . ferrous sulfate 325 (65 FE) MG EC tablet Take 325 mg by mouth 2 (two) times daily with a meal. 02/13/2018: Caregiver reports this medication was started 02/12/18 during PCP office visit  . furosemide (LASIX) 20 MG tablet Take one tablet 3x a week, on Monday, Wednesday and Friday.   Marland Kitchen guaiFENesin (MUCINEX) 600 MG 12 hr tablet Take 600 mg by mouth 2 (two) times daily as needed for cough.   Marland Kitchen ipratropium-albuterol (DUONEB) 0.5-2.5 (3) MG/3ML SOLN Take 3 mLs by nebulization 4 (four) times daily.   Marland Kitchen lovastatin (MEVACOR) 20 MG tablet Take 20 mg by mouth at bedtime.   . Multiple Vitamins-Minerals (WOMENS MULTIVITAMIN PLUS PO) Take 1 tablet by mouth daily.   . nitroGLYCERIN (NITROSTAT) 0.4 MG SL tablet Place 1 tablet (0.4 mg total) under the tongue every 5 (five) minutes x 3 doses as needed  for chest pain. 02/13/2018: Has not needed recently per patient and caregiver report  . oxybutynin (DITROPAN) 5 MG tablet Take 5 mg by mouth daily.    . propafenone (RYTHMOL) 225 MG tablet TAKE 1 TABLET BY MOUTH EVERY 8 HOURS    No facility-administered encounter medications on file as of 02/13/2018.     Functional Status:  In your present state of health, do you have any difficulty performing the following activities: 01/29/2018  Hearing? N  Vision? N  Difficulty concentrating or making decisions? Y  Walking or climbing stairs? Y  Dressing or bathing? N  Doing errands, shopping? Y  Some recent data might be hidden    Fall/Depression Screening:  No flowsheet data found.  Assessment: Patient very pleasant and friendly. Patient's daughter in law describes a long standing postive relationship with patient, however due to the progression of her diagnosis 24 hour caregving has been difficult. Patient referred to [facility] for the 30 day in home care program to assist patient with her ADL's. Medicaid application completed with patient's daughter in law who agrees to take completed application to the Department of Social Services on Thursday. Per patient's daughter in law, her husband has spoken with a Elder Training and development officer and has filed for [employer] of patient. And has uploaded the forms to be notarized.   Patient's daughter in law very tearful during the visit as she discussed her exhaustion with providing care. Social worker  and RNCM  provided daughter in law with supportive counseling , emphasizing self care.                                                                                                      Plan: [facility] to complete their intake on 02/14/18           Patient's daughter in law to take Medicaid application to the Department of Social Services on             Thursday.   Sheralyn Boatman [facility] Care Management [phone]

## 2018-02-14 ENCOUNTER — Encounter: Payer: Self-pay | Admitting: Physician Assistant

## 2018-02-14 ENCOUNTER — Ambulatory Visit: Payer: Medicare HMO | Admitting: Physician Assistant

## 2018-02-14 VITALS — BP 124/68 | HR 63 | Ht 63.0 in | Wt 143.0 lb

## 2018-02-14 DIAGNOSIS — Z950 Presence of cardiac pacemaker: Secondary | ICD-10-CM

## 2018-02-14 DIAGNOSIS — I481 Persistent atrial fibrillation: Secondary | ICD-10-CM | POA: Diagnosis not present

## 2018-02-14 DIAGNOSIS — I5022 Chronic systolic (congestive) heart failure: Secondary | ICD-10-CM | POA: Diagnosis not present

## 2018-02-14 DIAGNOSIS — D649 Anemia, unspecified: Secondary | ICD-10-CM

## 2018-02-14 DIAGNOSIS — I10 Essential (primary) hypertension: Secondary | ICD-10-CM | POA: Diagnosis not present

## 2018-02-14 DIAGNOSIS — I214 Non-ST elevation (NSTEMI) myocardial infarction: Secondary | ICD-10-CM | POA: Diagnosis not present

## 2018-02-14 DIAGNOSIS — I4819 Other persistent atrial fibrillation: Secondary | ICD-10-CM

## 2018-02-14 NOTE — Patient Instructions (Addendum)
Medication Instructions:  Your physician has recommended you make the following change in your medication:   1. STOP Propafenone   2. Continue all other medications as prescribed.   Labwork: None ordered  Testing/Procedures: None ordered  Follow-Up: Keep you appointment with Dr. Caryl Comes on June 11 th.    Any Other Special Instructions Will Be Listed Below (If Applicable).     If you need a refill on your cardiac medications before your next appointment, please call your pharmacy.

## 2018-02-15 DIAGNOSIS — Z7982 Long term (current) use of aspirin: Secondary | ICD-10-CM | POA: Diagnosis not present

## 2018-02-15 DIAGNOSIS — J441 Chronic obstructive pulmonary disease with (acute) exacerbation: Secondary | ICD-10-CM | POA: Diagnosis not present

## 2018-02-15 DIAGNOSIS — I4891 Unspecified atrial fibrillation: Secondary | ICD-10-CM | POA: Diagnosis not present

## 2018-02-15 DIAGNOSIS — Z950 Presence of cardiac pacemaker: Secondary | ICD-10-CM | POA: Diagnosis not present

## 2018-02-15 DIAGNOSIS — I5021 Acute systolic (congestive) heart failure: Secondary | ICD-10-CM | POA: Diagnosis not present

## 2018-02-15 DIAGNOSIS — G459 Transient cerebral ischemic attack, unspecified: Secondary | ICD-10-CM | POA: Diagnosis not present

## 2018-02-15 DIAGNOSIS — F0390 Unspecified dementia without behavioral disturbance: Secondary | ICD-10-CM | POA: Diagnosis not present

## 2018-02-15 DIAGNOSIS — I110 Hypertensive heart disease with heart failure: Secondary | ICD-10-CM | POA: Diagnosis not present

## 2018-02-15 DIAGNOSIS — K5730 Diverticulosis of large intestine without perforation or abscess without bleeding: Secondary | ICD-10-CM | POA: Diagnosis not present

## 2018-02-23 ENCOUNTER — Encounter

## 2018-02-23 ENCOUNTER — Ambulatory Visit: Payer: Medicare HMO | Admitting: Neurology

## 2018-02-26 ENCOUNTER — Ambulatory Visit: Payer: Medicare HMO | Admitting: Neurology

## 2018-02-27 ENCOUNTER — Other Ambulatory Visit: Payer: Self-pay | Admitting: *Deleted

## 2018-02-27 ENCOUNTER — Encounter: Payer: Self-pay | Admitting: *Deleted

## 2018-02-27 NOTE — Patient Outreach (Signed)
[facility]) Care Management [place] Telephone Outreach Post-hospital discharge day [place]   02/27/2018  Mikayla Harper September 22, 1930 [id number]  09:05 am:  Unsuccessful telephone outreach to Mikayla Harper, daughter-in-law/ caregiver, on [facility] CM written consent, of Mikayla Harper is a 84 y.o. female referred to [place] by [facility] Liaison RN CM after recent hospital visit May 6-13, 2019 for shortness of breath, acute CHF exacerbation, and elevated troponin levels.Patient has history including, but not limited to, A-Fib; sCHF; HTN/ HLD; COPD; anemia;dementia;and previous TIA.Patient was discharged home with her son and daughter-in lawwith home health services through [place] insurance provider denied SNF placement.  HIPAA compliant voice mail message left for patient's caregiver, requesting return call back.  2:35 pm:  Received incoming care coordination phone call from [place], patient update provided by Mikayla Harper with confirmation that she had spoken with patient's caregiver today-- caregiver works night shift as a Marine scientist-- will defer sending unsuccessful outreach letter, as Mikayla Harper confirmed that patient and caregiver remain engaged with [facility] CM services  Plan:  Will re-attempt [place] telephone outreach later this week if I do not hear back from patient first.   Oneta Rack, RN, BSN, [place] Coordinator [facility] Care Management  [phone]

## 2018-02-27 NOTE — Patient Outreach (Signed)
[facility]) Care Management  02/27/2018  Mikayla Harper 1930-05-11 [id number]   Phone call to patient's daughter in law fpr update on medicaid application and ALF placement. Patient's daughter in law very frustrated as placement had been identified, however patient makes $6.50 over the limit for skilled memory care.  Patient is also not allowing Homecare providers assist with her ADL's. Patient's daughter in law will now look into long term skilled care, long term options discussed. Per daughter in law, she will look into some long term care options in the [place] area.    Plan: This Education officer, museum will follow up with daughter in law within 2 weeks.    Sheralyn Boatman [facility] Care Management [phone]

## 2018-03-02 ENCOUNTER — Encounter: Payer: Self-pay | Admitting: *Deleted

## 2018-03-02 ENCOUNTER — Other Ambulatory Visit: Payer: Self-pay | Admitting: *Deleted

## 2018-03-02 NOTE — Patient Outreach (Signed)
[facility]) Care Management [place] Telephone Outreach Post-hospital discharge day 25 Unsuccessful call attempt #1  03/02/2018  Mikayla Harper [birth year] [id number]  Unsuccessful telephone outreach to Mikayla Harper, daughter-in-law/ caregiver, on [facility] CM written consent, of Mikayla Harper. Mikayla Harper a 84 y.o.femalereferred to [place] by [facility] Liaison RN CM after recent hospital visit May 6-13, 2019 for shortness of breath, acute CHF exacerbation, and elevated troponin levels.Patient has history including, but not limited to, A-Fib; sCHF; HTN/ HLD; COPD; anemia;dementia;and previous TIA.Patient was discharged home with her son and daughter-in lawwith home health services through [place] insurance provider denied SNF placement.  HIPAA compliant voice mail message left for patient's caregiver, requesting return call back.  Plan:  Will mail [place] unsuccessful outreach letter to patient, requesting call back in writing  Will make covering [place] aware of today's unsuccessful outreach attempt  Oneta Rack, RN, BSN, [place] Care Management  [phone]

## 2018-03-05 ENCOUNTER — Other Ambulatory Visit: Payer: Self-pay | Admitting: *Deleted

## 2018-03-05 ENCOUNTER — Ambulatory Visit: Payer: Self-pay | Admitting: *Deleted

## 2018-03-05 NOTE — Patient Outreach (Signed)
[facility]) Care Management  03/05/2018  Chereese [place] Aug 11, 1930 [id number]   Phone call to patient's daughter in law Otila Kluver to follow up on progress of identifying out of home placement for patient. Voicemail message left for a return call.   Sheralyn Boatman [facility] Care Management [phone]

## 2018-03-06 ENCOUNTER — Encounter: Payer: Medicare HMO | Admitting: Internal Medicine

## 2018-03-06 NOTE — Progress Notes (Deleted)
Patient Care Team: Tisovec, Fransico Him, MD as PCP - General Mikayla Comes Revonda Standard, MD as PCP - Cardiology (Cardiology) Mikayla Royalty, RN as Mikayla Harper, Mikayla Harper, Mikayla Harper as [place] Management   HPI  Mikayla Harper is a 84 y.o. female  Seen in followup for a pacemaker implanted January 2013 for tachybradycardia syndrome with paroxysmal atrial fibrillation as well as prolonged pauses;   She has had intercurrent problems with GI bleeding attributed to AVMs and her Rivaroxaban was discontinued.  She is managed on aspirin alone.  She has had what was felt to be a  TIA.    She has been on chronic propafenone. She says she has not had recurrence since 2003.      Her husband is 35 years died one month ago. His name was Mikayla Harper.***  Echocardiogram 1/13 demonstrated normal left ventricular function and normal left atrial size and normal left atrial size there was diastolic dysfunction  Thromboembolic risk factors are notable for gender and age and prior TIA. She does has a CHADS-VASc score of 5;    Date PR interval QRS d  2/15   88  2/16  106  5/17 21 92  5/18 24 102       *** Past Medical History:  Diagnosis Date  . Anemia   . Carpal tunnel syndrome of left wrist 12/20/2010   left hand  . Dementia   . Hyperlipidemia    on Mevacor  . Influenza-like illness 2009   H1N1 PCR negative  . Osteopenia   . Pacemaker - [facility]    Implant Jan 2013  . PAF (paroxysmal atrial fibrillation) ([place])    previously on propafenone  . Sick sinus syndrome ([place])   . TIA (transient ischemic attack) Dec 2012  . Tuberculosis     hx of over 50 years ago "  . Urinary tract infection     Past Surgical History:  Procedure Laterality Date  . ABDOMINAL HYSTERECTOMY    . BACK SURGERY  1976  . CARDIOVERSION N/A 11/13/2012   Procedure: CARDIOVERSION;  Surgeon: Mikayla M Martinique, MD;  Location: [facility] ENDOSCOPY;  Service: Cardiovascular;  Laterality: N/A;    . COLONOSCOPY  08/01/2012   Procedure: COLONOSCOPY;  Surgeon: Mikayla Banister, MD;  Location: [facility] ENDOSCOPY;  Service: Endoscopy;  Laterality: N/A;  . ESOPHAGOGASTRODUODENOSCOPY  08/01/2012   Procedure: ESOPHAGOGASTRODUODENOSCOPY (EGD);  Surgeon: Mikayla Banister, MD;  Location: [facility] ENDOSCOPY;  Service: Endoscopy;  Laterality: N/A;  . INSERT / REPLACE / REMOVE PACEMAKER  10/21/2011  . LUMBAR FUSION N/A   . OTHER SURGICAL HISTORY  In the late 1960's   Hysterectomy secondary to fibroids  . OTHER SURGICAL HISTORY     cataract surgery  . PERMANENT PACEMAKER INSERTION N/A 10/21/2011   Procedure: PERMANENT PACEMAKER INSERTION;  Surgeon: Mikayla Sprang, MD;  Location[facility] CATH LAB;  Service: Cardiovascular;  Laterality: N/A;    Current Outpatient Medications  Medication Sig Dispense Refill  . albuterol (PROVENTIL HFA;VENTOLIN HFA) 108 (90 BASE) MCG/ACT inhaler Inhale 2 puffs into the lungs every 6 (six) hours as needed for wheezing.    Marland Kitchen aspirin EC 81 MG EC tablet Take 1 tablet (81 mg total) by mouth daily.    . carvedilol (COREG) 3.125 MG tablet Take 1 tablet (3.125 mg total) by mouth 2 (two) times daily with a meal. 60 tablet 3  . donepezil (ARICEPT) 10 MG tablet Take 1 tablet  daily 30 tablet 6  . ferrous sulfate 325 (65 FE) MG EC tablet Take 325 mg by mouth 2 (two) times daily with a meal.    . furosemide (LASIX) 20 MG tablet Take one tablet 3x a week, on Monday, Wednesday and Friday. 30 tablet 1  . guaiFENesin (MUCINEX) 600 MG 12 hr tablet Take 600 mg by mouth daily.     Marland Kitchen ipratropium-albuterol (DUONEB) 0.5-2.5 (3) MG/3ML SOLN Take 3 mLs by nebulization 4 (four) times daily.    Marland Kitchen lovastatin (MEVACOR) 20 MG tablet Take 20 mg by mouth at bedtime.    . mirtazapine (REMERON) 15 MG tablet Take 1 tablet by mouth at bedtime.    . Multiple Vitamins-Minerals (WOMENS MULTIVITAMIN PLUS PO) Take 1 tablet by mouth daily.    . nitroGLYCERIN (NITROSTAT) 0.4 MG SL tablet Place 1 tablet (0.4 mg total) under the  tongue every 5 (five) minutes x 3 doses as needed for chest pain. 25 tablet 12  . oxybutynin (DITROPAN) 5 MG tablet Take 5 mg by mouth daily.      No current facility-administered medications for this visit.     Allergies  Allergen Reactions  . Lisinopril Other (See Comments)    Dizzy and lightheadedness    Review of Systems negative except from HPI and PMH  Physical Exam There were no vitals taken for this visit. Well developed and nourished in no acute distress HENT normal Neck supple with JVP-flat Carotids brisk and full without bruits Clear Regular rate and rhythm, no murmurs or gallops Abd-soft with active BS without hepatomegaly No Clubbing cyanosis edema Skin-warm and dry A & Oriented  Grossly normal sensory and motor function   ECG demonstrates atrial pacing at 60 Interval  24/10/41  Assessment and  Plan  Atrial fibrillation-paroxysmal  Pacemaker-[employer]  Grief  Hypertension   Sinus node dysfunction The patient's device was interrogated.  The information was reviewed. No changes were made in the programming.     Good heart rate excursion   No intercurrent atrial fibrillation or flutter  Not on anticoagulation 2/2 AVMs  Grief counseling/sharing  BP well controlled  Continue dilt

## 2018-03-07 ENCOUNTER — Encounter: Payer: Self-pay | Admitting: Internal Medicine

## 2018-03-07 ENCOUNTER — Other Ambulatory Visit: Payer: Self-pay

## 2018-03-07 DIAGNOSIS — R1319 Other dysphagia: Secondary | ICD-10-CM | POA: Diagnosis not present

## 2018-03-07 DIAGNOSIS — R2681 Unsteadiness on feet: Secondary | ICD-10-CM | POA: Diagnosis not present

## 2018-03-07 DIAGNOSIS — F0390 Unspecified dementia without behavioral disturbance: Secondary | ICD-10-CM | POA: Diagnosis not present

## 2018-03-07 DIAGNOSIS — J441 Chronic obstructive pulmonary disease with (acute) exacerbation: Secondary | ICD-10-CM | POA: Diagnosis not present

## 2018-03-07 DIAGNOSIS — F0280 Dementia in other diseases classified elsewhere without behavioral disturbance: Secondary | ICD-10-CM | POA: Diagnosis not present

## 2018-03-07 DIAGNOSIS — R41841 Cognitive communication deficit: Secondary | ICD-10-CM | POA: Diagnosis not present

## 2018-03-07 DIAGNOSIS — I5023 Acute on chronic systolic (congestive) heart failure: Secondary | ICD-10-CM | POA: Diagnosis not present

## 2018-03-07 NOTE — Patient Outreach (Signed)
[facility]) Care Management  03/07/2018  Mikayla Harper Mar 10, 1930 [id number]   84 y.o.femalereferred to [place] by [facility] Liaison RN CM after recent hospital visit May 6-13, 2019 for shortness of breath, acute CHF exacerbation, and elevated troponin levels.Patient has history including, but not limited to, A-Fib; sCHF; HTN/ HLD; COPD; anemia;dementia;and previous TIA.  RNCM called to follow up. No answer. HIPPA compliant message left.   RNCM received immediate return call from Marney Setting, daughter-in-law/ caregiver, who states that client is now at a facility for long term care [facility]).   Plan: close case. Update assigned care coordinator.  Covering RNCM: Thea Silversmith, RN, MSN, [place] Coordinator Cell: [phone]

## 2018-03-08 DIAGNOSIS — R2681 Unsteadiness on feet: Secondary | ICD-10-CM | POA: Diagnosis not present

## 2018-03-08 DIAGNOSIS — F0391 Unspecified dementia with behavioral disturbance: Secondary | ICD-10-CM | POA: Diagnosis not present

## 2018-03-08 DIAGNOSIS — I5023 Acute on chronic systolic (congestive) heart failure: Secondary | ICD-10-CM | POA: Diagnosis not present

## 2018-03-08 DIAGNOSIS — F0390 Unspecified dementia without behavioral disturbance: Secondary | ICD-10-CM | POA: Diagnosis not present

## 2018-03-08 DIAGNOSIS — I10 Essential (primary) hypertension: Secondary | ICD-10-CM | POA: Diagnosis not present

## 2018-03-08 DIAGNOSIS — E46 Unspecified protein-calorie malnutrition: Secondary | ICD-10-CM | POA: Diagnosis not present

## 2018-03-08 DIAGNOSIS — J449 Chronic obstructive pulmonary disease, unspecified: Secondary | ICD-10-CM | POA: Diagnosis not present

## 2018-03-08 DIAGNOSIS — R1319 Other dysphagia: Secondary | ICD-10-CM | POA: Diagnosis not present

## 2018-03-08 DIAGNOSIS — F0280 Dementia in other diseases classified elsewhere without behavioral disturbance: Secondary | ICD-10-CM | POA: Diagnosis not present

## 2018-03-08 DIAGNOSIS — J441 Chronic obstructive pulmonary disease with (acute) exacerbation: Secondary | ICD-10-CM | POA: Diagnosis not present

## 2018-03-08 DIAGNOSIS — F411 Generalized anxiety disorder: Secondary | ICD-10-CM | POA: Diagnosis not present

## 2018-03-08 DIAGNOSIS — N183 Chronic kidney disease, stage 3 (moderate): Secondary | ICD-10-CM | POA: Diagnosis not present

## 2018-03-08 DIAGNOSIS — R41841 Cognitive communication deficit: Secondary | ICD-10-CM | POA: Diagnosis not present

## 2018-03-08 DIAGNOSIS — I4891 Unspecified atrial fibrillation: Secondary | ICD-10-CM | POA: Diagnosis not present

## 2018-03-08 DIAGNOSIS — R32 Unspecified urinary incontinence: Secondary | ICD-10-CM | POA: Diagnosis not present

## 2018-03-08 DIAGNOSIS — F322 Major depressive disorder, single episode, severe without psychotic features: Secondary | ICD-10-CM | POA: Diagnosis not present

## 2018-03-09 DIAGNOSIS — J441 Chronic obstructive pulmonary disease with (acute) exacerbation: Secondary | ICD-10-CM | POA: Diagnosis not present

## 2018-03-09 DIAGNOSIS — R2681 Unsteadiness on feet: Secondary | ICD-10-CM | POA: Diagnosis not present

## 2018-03-09 DIAGNOSIS — I5023 Acute on chronic systolic (congestive) heart failure: Secondary | ICD-10-CM | POA: Diagnosis not present

## 2018-03-09 DIAGNOSIS — R41841 Cognitive communication deficit: Secondary | ICD-10-CM | POA: Diagnosis not present

## 2018-03-09 DIAGNOSIS — F0390 Unspecified dementia without behavioral disturbance: Secondary | ICD-10-CM | POA: Diagnosis not present

## 2018-03-09 DIAGNOSIS — F0280 Dementia in other diseases classified elsewhere without behavioral disturbance: Secondary | ICD-10-CM | POA: Diagnosis not present

## 2018-03-09 DIAGNOSIS — R1319 Other dysphagia: Secondary | ICD-10-CM | POA: Diagnosis not present

## 2018-03-10 DIAGNOSIS — I5023 Acute on chronic systolic (congestive) heart failure: Secondary | ICD-10-CM | POA: Diagnosis not present

## 2018-03-10 DIAGNOSIS — R41841 Cognitive communication deficit: Secondary | ICD-10-CM | POA: Diagnosis not present

## 2018-03-10 DIAGNOSIS — F0280 Dementia in other diseases classified elsewhere without behavioral disturbance: Secondary | ICD-10-CM | POA: Diagnosis not present

## 2018-03-10 DIAGNOSIS — R1319 Other dysphagia: Secondary | ICD-10-CM | POA: Diagnosis not present

## 2018-03-10 DIAGNOSIS — F0390 Unspecified dementia without behavioral disturbance: Secondary | ICD-10-CM | POA: Diagnosis not present

## 2018-03-10 DIAGNOSIS — R2681 Unsteadiness on feet: Secondary | ICD-10-CM | POA: Diagnosis not present

## 2018-03-10 DIAGNOSIS — J441 Chronic obstructive pulmonary disease with (acute) exacerbation: Secondary | ICD-10-CM | POA: Diagnosis not present

## 2018-03-12 ENCOUNTER — Other Ambulatory Visit: Payer: Self-pay | Admitting: *Deleted

## 2018-03-12 DIAGNOSIS — I5023 Acute on chronic systolic (congestive) heart failure: Secondary | ICD-10-CM | POA: Diagnosis not present

## 2018-03-12 DIAGNOSIS — J441 Chronic obstructive pulmonary disease with (acute) exacerbation: Secondary | ICD-10-CM | POA: Diagnosis not present

## 2018-03-12 DIAGNOSIS — R2681 Unsteadiness on feet: Secondary | ICD-10-CM | POA: Diagnosis not present

## 2018-03-12 DIAGNOSIS — R41841 Cognitive communication deficit: Secondary | ICD-10-CM | POA: Diagnosis not present

## 2018-03-12 DIAGNOSIS — F0390 Unspecified dementia without behavioral disturbance: Secondary | ICD-10-CM | POA: Diagnosis not present

## 2018-03-12 DIAGNOSIS — R1319 Other dysphagia: Secondary | ICD-10-CM | POA: Diagnosis not present

## 2018-03-12 DIAGNOSIS — F0280 Dementia in other diseases classified elsewhere without behavioral disturbance: Secondary | ICD-10-CM | POA: Diagnosis not present

## 2018-03-12 NOTE — Patient Outreach (Signed)
[facility]) Care Management  03/12/2018  Mikayla Harper 08-24-1930 [id number]   Per chart review, patient is now placed in a long term care facility-[facility].  Patient to be closed to [facility] care management at this time.    Plan: close case, primary care doctor to be notified.    Sheralyn Boatman [facility] Care Management [phone]

## 2018-03-13 ENCOUNTER — Ambulatory Visit: Payer: Self-pay | Admitting: *Deleted

## 2018-03-13 DIAGNOSIS — R1319 Other dysphagia: Secondary | ICD-10-CM | POA: Diagnosis not present

## 2018-03-13 DIAGNOSIS — R2681 Unsteadiness on feet: Secondary | ICD-10-CM | POA: Diagnosis not present

## 2018-03-13 DIAGNOSIS — J441 Chronic obstructive pulmonary disease with (acute) exacerbation: Secondary | ICD-10-CM | POA: Diagnosis not present

## 2018-03-13 DIAGNOSIS — F0280 Dementia in other diseases classified elsewhere without behavioral disturbance: Secondary | ICD-10-CM | POA: Diagnosis not present

## 2018-03-13 DIAGNOSIS — F0390 Unspecified dementia without behavioral disturbance: Secondary | ICD-10-CM | POA: Diagnosis not present

## 2018-03-13 DIAGNOSIS — I5023 Acute on chronic systolic (congestive) heart failure: Secondary | ICD-10-CM | POA: Diagnosis not present

## 2018-03-13 DIAGNOSIS — R41841 Cognitive communication deficit: Secondary | ICD-10-CM | POA: Diagnosis not present

## 2018-03-14 DIAGNOSIS — F0280 Dementia in other diseases classified elsewhere without behavioral disturbance: Secondary | ICD-10-CM | POA: Diagnosis not present

## 2018-03-14 DIAGNOSIS — I5023 Acute on chronic systolic (congestive) heart failure: Secondary | ICD-10-CM | POA: Diagnosis not present

## 2018-03-14 DIAGNOSIS — F0390 Unspecified dementia without behavioral disturbance: Secondary | ICD-10-CM | POA: Diagnosis not present

## 2018-03-14 DIAGNOSIS — R41841 Cognitive communication deficit: Secondary | ICD-10-CM | POA: Diagnosis not present

## 2018-03-14 DIAGNOSIS — R1319 Other dysphagia: Secondary | ICD-10-CM | POA: Diagnosis not present

## 2018-03-14 DIAGNOSIS — R2681 Unsteadiness on feet: Secondary | ICD-10-CM | POA: Diagnosis not present

## 2018-03-14 DIAGNOSIS — J441 Chronic obstructive pulmonary disease with (acute) exacerbation: Secondary | ICD-10-CM | POA: Diagnosis not present

## 2018-03-15 DIAGNOSIS — J441 Chronic obstructive pulmonary disease with (acute) exacerbation: Secondary | ICD-10-CM | POA: Diagnosis not present

## 2018-03-15 DIAGNOSIS — R1319 Other dysphagia: Secondary | ICD-10-CM | POA: Diagnosis not present

## 2018-03-15 DIAGNOSIS — R41841 Cognitive communication deficit: Secondary | ICD-10-CM | POA: Diagnosis not present

## 2018-03-15 DIAGNOSIS — F0280 Dementia in other diseases classified elsewhere without behavioral disturbance: Secondary | ICD-10-CM | POA: Diagnosis not present

## 2018-03-15 DIAGNOSIS — F0390 Unspecified dementia without behavioral disturbance: Secondary | ICD-10-CM | POA: Diagnosis not present

## 2018-03-15 DIAGNOSIS — I5023 Acute on chronic systolic (congestive) heart failure: Secondary | ICD-10-CM | POA: Diagnosis not present

## 2018-03-15 DIAGNOSIS — R2681 Unsteadiness on feet: Secondary | ICD-10-CM | POA: Diagnosis not present

## 2018-03-16 DIAGNOSIS — R2681 Unsteadiness on feet: Secondary | ICD-10-CM | POA: Diagnosis not present

## 2018-03-16 DIAGNOSIS — I5023 Acute on chronic systolic (congestive) heart failure: Secondary | ICD-10-CM | POA: Diagnosis not present

## 2018-03-16 DIAGNOSIS — R1319 Other dysphagia: Secondary | ICD-10-CM | POA: Diagnosis not present

## 2018-03-16 DIAGNOSIS — F0280 Dementia in other diseases classified elsewhere without behavioral disturbance: Secondary | ICD-10-CM | POA: Diagnosis not present

## 2018-03-16 DIAGNOSIS — F0390 Unspecified dementia without behavioral disturbance: Secondary | ICD-10-CM | POA: Diagnosis not present

## 2018-03-16 DIAGNOSIS — J441 Chronic obstructive pulmonary disease with (acute) exacerbation: Secondary | ICD-10-CM | POA: Diagnosis not present

## 2018-03-16 DIAGNOSIS — R41841 Cognitive communication deficit: Secondary | ICD-10-CM | POA: Diagnosis not present

## 2018-03-17 DIAGNOSIS — F0280 Dementia in other diseases classified elsewhere without behavioral disturbance: Secondary | ICD-10-CM | POA: Diagnosis not present

## 2018-03-17 DIAGNOSIS — R41841 Cognitive communication deficit: Secondary | ICD-10-CM | POA: Diagnosis not present

## 2018-03-17 DIAGNOSIS — I5023 Acute on chronic systolic (congestive) heart failure: Secondary | ICD-10-CM | POA: Diagnosis not present

## 2018-03-17 DIAGNOSIS — R1319 Other dysphagia: Secondary | ICD-10-CM | POA: Diagnosis not present

## 2018-03-17 DIAGNOSIS — F0390 Unspecified dementia without behavioral disturbance: Secondary | ICD-10-CM | POA: Diagnosis not present

## 2018-03-17 DIAGNOSIS — J441 Chronic obstructive pulmonary disease with (acute) exacerbation: Secondary | ICD-10-CM | POA: Diagnosis not present

## 2018-03-17 DIAGNOSIS — R2681 Unsteadiness on feet: Secondary | ICD-10-CM | POA: Diagnosis not present

## 2018-03-18 DIAGNOSIS — I5023 Acute on chronic systolic (congestive) heart failure: Secondary | ICD-10-CM | POA: Diagnosis not present

## 2018-03-18 DIAGNOSIS — R41841 Cognitive communication deficit: Secondary | ICD-10-CM | POA: Diagnosis not present

## 2018-03-18 DIAGNOSIS — F0390 Unspecified dementia without behavioral disturbance: Secondary | ICD-10-CM | POA: Diagnosis not present

## 2018-03-18 DIAGNOSIS — J441 Chronic obstructive pulmonary disease with (acute) exacerbation: Secondary | ICD-10-CM | POA: Diagnosis not present

## 2018-03-18 DIAGNOSIS — F0280 Dementia in other diseases classified elsewhere without behavioral disturbance: Secondary | ICD-10-CM | POA: Diagnosis not present

## 2018-03-18 DIAGNOSIS — R2681 Unsteadiness on feet: Secondary | ICD-10-CM | POA: Diagnosis not present

## 2018-03-18 DIAGNOSIS — R1319 Other dysphagia: Secondary | ICD-10-CM | POA: Diagnosis not present

## 2018-03-19 DIAGNOSIS — F0390 Unspecified dementia without behavioral disturbance: Secondary | ICD-10-CM | POA: Diagnosis not present

## 2018-03-19 DIAGNOSIS — J441 Chronic obstructive pulmonary disease with (acute) exacerbation: Secondary | ICD-10-CM | POA: Diagnosis not present

## 2018-03-19 DIAGNOSIS — R41841 Cognitive communication deficit: Secondary | ICD-10-CM | POA: Diagnosis not present

## 2018-03-19 DIAGNOSIS — F0280 Dementia in other diseases classified elsewhere without behavioral disturbance: Secondary | ICD-10-CM | POA: Diagnosis not present

## 2018-03-19 DIAGNOSIS — R2681 Unsteadiness on feet: Secondary | ICD-10-CM | POA: Diagnosis not present

## 2018-03-19 DIAGNOSIS — I5023 Acute on chronic systolic (congestive) heart failure: Secondary | ICD-10-CM | POA: Diagnosis not present

## 2018-03-19 DIAGNOSIS — R1319 Other dysphagia: Secondary | ICD-10-CM | POA: Diagnosis not present

## 2018-03-20 DIAGNOSIS — J441 Chronic obstructive pulmonary disease with (acute) exacerbation: Secondary | ICD-10-CM | POA: Diagnosis not present

## 2018-03-20 DIAGNOSIS — R1319 Other dysphagia: Secondary | ICD-10-CM | POA: Diagnosis not present

## 2018-03-20 DIAGNOSIS — R41841 Cognitive communication deficit: Secondary | ICD-10-CM | POA: Diagnosis not present

## 2018-03-20 DIAGNOSIS — I5023 Acute on chronic systolic (congestive) heart failure: Secondary | ICD-10-CM | POA: Diagnosis not present

## 2018-03-20 DIAGNOSIS — F0280 Dementia in other diseases classified elsewhere without behavioral disturbance: Secondary | ICD-10-CM | POA: Diagnosis not present

## 2018-03-20 DIAGNOSIS — F0390 Unspecified dementia without behavioral disturbance: Secondary | ICD-10-CM | POA: Diagnosis not present

## 2018-03-20 DIAGNOSIS — R2681 Unsteadiness on feet: Secondary | ICD-10-CM | POA: Diagnosis not present

## 2018-03-21 DIAGNOSIS — F0390 Unspecified dementia without behavioral disturbance: Secondary | ICD-10-CM | POA: Diagnosis not present

## 2018-03-21 DIAGNOSIS — R41841 Cognitive communication deficit: Secondary | ICD-10-CM | POA: Diagnosis not present

## 2018-03-21 DIAGNOSIS — F0280 Dementia in other diseases classified elsewhere without behavioral disturbance: Secondary | ICD-10-CM | POA: Diagnosis not present

## 2018-03-21 DIAGNOSIS — R2681 Unsteadiness on feet: Secondary | ICD-10-CM | POA: Diagnosis not present

## 2018-03-21 DIAGNOSIS — R1319 Other dysphagia: Secondary | ICD-10-CM | POA: Diagnosis not present

## 2018-03-21 DIAGNOSIS — J441 Chronic obstructive pulmonary disease with (acute) exacerbation: Secondary | ICD-10-CM | POA: Diagnosis not present

## 2018-03-21 DIAGNOSIS — I5023 Acute on chronic systolic (congestive) heart failure: Secondary | ICD-10-CM | POA: Diagnosis not present

## 2018-03-22 DIAGNOSIS — R41841 Cognitive communication deficit: Secondary | ICD-10-CM | POA: Diagnosis not present

## 2018-03-22 DIAGNOSIS — R1319 Other dysphagia: Secondary | ICD-10-CM | POA: Diagnosis not present

## 2018-03-22 DIAGNOSIS — F0280 Dementia in other diseases classified elsewhere without behavioral disturbance: Secondary | ICD-10-CM | POA: Diagnosis not present

## 2018-03-22 DIAGNOSIS — I5023 Acute on chronic systolic (congestive) heart failure: Secondary | ICD-10-CM | POA: Diagnosis not present

## 2018-03-22 DIAGNOSIS — F0390 Unspecified dementia without behavioral disturbance: Secondary | ICD-10-CM | POA: Diagnosis not present

## 2018-03-22 DIAGNOSIS — J441 Chronic obstructive pulmonary disease with (acute) exacerbation: Secondary | ICD-10-CM | POA: Diagnosis not present

## 2018-03-22 DIAGNOSIS — R2681 Unsteadiness on feet: Secondary | ICD-10-CM | POA: Diagnosis not present

## 2018-03-23 DIAGNOSIS — R2681 Unsteadiness on feet: Secondary | ICD-10-CM | POA: Diagnosis not present

## 2018-03-23 DIAGNOSIS — J441 Chronic obstructive pulmonary disease with (acute) exacerbation: Secondary | ICD-10-CM | POA: Diagnosis not present

## 2018-03-23 DIAGNOSIS — F0390 Unspecified dementia without behavioral disturbance: Secondary | ICD-10-CM | POA: Diagnosis not present

## 2018-03-23 DIAGNOSIS — F0280 Dementia in other diseases classified elsewhere without behavioral disturbance: Secondary | ICD-10-CM | POA: Diagnosis not present

## 2018-03-23 DIAGNOSIS — R41841 Cognitive communication deficit: Secondary | ICD-10-CM | POA: Diagnosis not present

## 2018-03-23 DIAGNOSIS — R1319 Other dysphagia: Secondary | ICD-10-CM | POA: Diagnosis not present

## 2018-03-23 DIAGNOSIS — I5023 Acute on chronic systolic (congestive) heart failure: Secondary | ICD-10-CM | POA: Diagnosis not present

## 2018-03-24 DIAGNOSIS — R2681 Unsteadiness on feet: Secondary | ICD-10-CM | POA: Diagnosis not present

## 2018-03-24 DIAGNOSIS — R1319 Other dysphagia: Secondary | ICD-10-CM | POA: Diagnosis not present

## 2018-03-24 DIAGNOSIS — J441 Chronic obstructive pulmonary disease with (acute) exacerbation: Secondary | ICD-10-CM | POA: Diagnosis not present

## 2018-03-24 DIAGNOSIS — R41841 Cognitive communication deficit: Secondary | ICD-10-CM | POA: Diagnosis not present

## 2018-03-24 DIAGNOSIS — F0280 Dementia in other diseases classified elsewhere without behavioral disturbance: Secondary | ICD-10-CM | POA: Diagnosis not present

## 2018-03-24 DIAGNOSIS — I5023 Acute on chronic systolic (congestive) heart failure: Secondary | ICD-10-CM | POA: Diagnosis not present

## 2018-03-24 DIAGNOSIS — F0390 Unspecified dementia without behavioral disturbance: Secondary | ICD-10-CM | POA: Diagnosis not present

## 2018-03-25 DIAGNOSIS — R41841 Cognitive communication deficit: Secondary | ICD-10-CM | POA: Diagnosis not present

## 2018-03-25 DIAGNOSIS — R1319 Other dysphagia: Secondary | ICD-10-CM | POA: Diagnosis not present

## 2018-03-25 DIAGNOSIS — J441 Chronic obstructive pulmonary disease with (acute) exacerbation: Secondary | ICD-10-CM | POA: Diagnosis not present

## 2018-03-25 DIAGNOSIS — I5023 Acute on chronic systolic (congestive) heart failure: Secondary | ICD-10-CM | POA: Diagnosis not present

## 2018-03-25 DIAGNOSIS — F0280 Dementia in other diseases classified elsewhere without behavioral disturbance: Secondary | ICD-10-CM | POA: Diagnosis not present

## 2018-03-25 DIAGNOSIS — R2681 Unsteadiness on feet: Secondary | ICD-10-CM | POA: Diagnosis not present

## 2018-03-25 DIAGNOSIS — F0390 Unspecified dementia without behavioral disturbance: Secondary | ICD-10-CM | POA: Diagnosis not present

## 2018-03-26 DIAGNOSIS — R41841 Cognitive communication deficit: Secondary | ICD-10-CM | POA: Diagnosis not present

## 2018-03-26 DIAGNOSIS — R2681 Unsteadiness on feet: Secondary | ICD-10-CM | POA: Diagnosis not present

## 2018-03-26 DIAGNOSIS — I5023 Acute on chronic systolic (congestive) heart failure: Secondary | ICD-10-CM | POA: Diagnosis not present

## 2018-03-26 DIAGNOSIS — J441 Chronic obstructive pulmonary disease with (acute) exacerbation: Secondary | ICD-10-CM | POA: Diagnosis not present

## 2018-03-26 DIAGNOSIS — F0390 Unspecified dementia without behavioral disturbance: Secondary | ICD-10-CM | POA: Diagnosis not present

## 2018-03-26 DIAGNOSIS — R1319 Other dysphagia: Secondary | ICD-10-CM | POA: Diagnosis not present

## 2018-03-26 DIAGNOSIS — F0280 Dementia in other diseases classified elsewhere without behavioral disturbance: Secondary | ICD-10-CM | POA: Diagnosis not present

## 2018-03-27 DIAGNOSIS — I5023 Acute on chronic systolic (congestive) heart failure: Secondary | ICD-10-CM | POA: Diagnosis not present

## 2018-03-27 DIAGNOSIS — R1319 Other dysphagia: Secondary | ICD-10-CM | POA: Diagnosis not present

## 2018-03-27 DIAGNOSIS — R41841 Cognitive communication deficit: Secondary | ICD-10-CM | POA: Diagnosis not present

## 2018-03-27 DIAGNOSIS — F0390 Unspecified dementia without behavioral disturbance: Secondary | ICD-10-CM | POA: Diagnosis not present

## 2018-03-27 DIAGNOSIS — J441 Chronic obstructive pulmonary disease with (acute) exacerbation: Secondary | ICD-10-CM | POA: Diagnosis not present

## 2018-03-27 DIAGNOSIS — R2681 Unsteadiness on feet: Secondary | ICD-10-CM | POA: Diagnosis not present

## 2018-03-27 DIAGNOSIS — F0280 Dementia in other diseases classified elsewhere without behavioral disturbance: Secondary | ICD-10-CM | POA: Diagnosis not present

## 2018-03-28 DIAGNOSIS — R1319 Other dysphagia: Secondary | ICD-10-CM | POA: Diagnosis not present

## 2018-03-28 DIAGNOSIS — F0280 Dementia in other diseases classified elsewhere without behavioral disturbance: Secondary | ICD-10-CM | POA: Diagnosis not present

## 2018-03-28 DIAGNOSIS — J441 Chronic obstructive pulmonary disease with (acute) exacerbation: Secondary | ICD-10-CM | POA: Diagnosis not present

## 2018-03-28 DIAGNOSIS — F0390 Unspecified dementia without behavioral disturbance: Secondary | ICD-10-CM | POA: Diagnosis not present

## 2018-03-28 DIAGNOSIS — R2681 Unsteadiness on feet: Secondary | ICD-10-CM | POA: Diagnosis not present

## 2018-03-28 DIAGNOSIS — I5023 Acute on chronic systolic (congestive) heart failure: Secondary | ICD-10-CM | POA: Diagnosis not present

## 2018-03-28 DIAGNOSIS — R41841 Cognitive communication deficit: Secondary | ICD-10-CM | POA: Diagnosis not present

## 2018-03-29 DIAGNOSIS — J441 Chronic obstructive pulmonary disease with (acute) exacerbation: Secondary | ICD-10-CM | POA: Diagnosis not present

## 2018-03-29 DIAGNOSIS — F0390 Unspecified dementia without behavioral disturbance: Secondary | ICD-10-CM | POA: Diagnosis not present

## 2018-03-29 DIAGNOSIS — F0280 Dementia in other diseases classified elsewhere without behavioral disturbance: Secondary | ICD-10-CM | POA: Diagnosis not present

## 2018-03-29 DIAGNOSIS — R2681 Unsteadiness on feet: Secondary | ICD-10-CM | POA: Diagnosis not present

## 2018-03-29 DIAGNOSIS — I5023 Acute on chronic systolic (congestive) heart failure: Secondary | ICD-10-CM | POA: Diagnosis not present

## 2018-03-29 DIAGNOSIS — R41841 Cognitive communication deficit: Secondary | ICD-10-CM | POA: Diagnosis not present

## 2018-03-29 DIAGNOSIS — R1319 Other dysphagia: Secondary | ICD-10-CM | POA: Diagnosis not present

## 2018-03-30 DIAGNOSIS — J441 Chronic obstructive pulmonary disease with (acute) exacerbation: Secondary | ICD-10-CM | POA: Diagnosis not present

## 2018-03-30 DIAGNOSIS — F0390 Unspecified dementia without behavioral disturbance: Secondary | ICD-10-CM | POA: Diagnosis not present

## 2018-03-30 DIAGNOSIS — R1319 Other dysphagia: Secondary | ICD-10-CM | POA: Diagnosis not present

## 2018-03-30 DIAGNOSIS — F0280 Dementia in other diseases classified elsewhere without behavioral disturbance: Secondary | ICD-10-CM | POA: Diagnosis not present

## 2018-03-30 DIAGNOSIS — R41841 Cognitive communication deficit: Secondary | ICD-10-CM | POA: Diagnosis not present

## 2018-03-30 DIAGNOSIS — R2681 Unsteadiness on feet: Secondary | ICD-10-CM | POA: Diagnosis not present

## 2018-03-30 DIAGNOSIS — I5023 Acute on chronic systolic (congestive) heart failure: Secondary | ICD-10-CM | POA: Diagnosis not present

## 2018-03-31 DIAGNOSIS — I5023 Acute on chronic systolic (congestive) heart failure: Secondary | ICD-10-CM | POA: Diagnosis not present

## 2018-03-31 DIAGNOSIS — R41841 Cognitive communication deficit: Secondary | ICD-10-CM | POA: Diagnosis not present

## 2018-03-31 DIAGNOSIS — F0390 Unspecified dementia without behavioral disturbance: Secondary | ICD-10-CM | POA: Diagnosis not present

## 2018-03-31 DIAGNOSIS — R1319 Other dysphagia: Secondary | ICD-10-CM | POA: Diagnosis not present

## 2018-03-31 DIAGNOSIS — R2681 Unsteadiness on feet: Secondary | ICD-10-CM | POA: Diagnosis not present

## 2018-03-31 DIAGNOSIS — J441 Chronic obstructive pulmonary disease with (acute) exacerbation: Secondary | ICD-10-CM | POA: Diagnosis not present

## 2018-03-31 DIAGNOSIS — F0280 Dementia in other diseases classified elsewhere without behavioral disturbance: Secondary | ICD-10-CM | POA: Diagnosis not present

## 2018-04-02 DIAGNOSIS — R2681 Unsteadiness on feet: Secondary | ICD-10-CM | POA: Diagnosis not present

## 2018-04-02 DIAGNOSIS — F0280 Dementia in other diseases classified elsewhere without behavioral disturbance: Secondary | ICD-10-CM | POA: Diagnosis not present

## 2018-04-02 DIAGNOSIS — R1319 Other dysphagia: Secondary | ICD-10-CM | POA: Diagnosis not present

## 2018-04-02 DIAGNOSIS — J441 Chronic obstructive pulmonary disease with (acute) exacerbation: Secondary | ICD-10-CM | POA: Diagnosis not present

## 2018-04-02 DIAGNOSIS — F0390 Unspecified dementia without behavioral disturbance: Secondary | ICD-10-CM | POA: Diagnosis not present

## 2018-04-02 DIAGNOSIS — I5023 Acute on chronic systolic (congestive) heart failure: Secondary | ICD-10-CM | POA: Diagnosis not present

## 2018-04-02 DIAGNOSIS — R41841 Cognitive communication deficit: Secondary | ICD-10-CM | POA: Diagnosis not present

## 2018-04-03 DIAGNOSIS — R2681 Unsteadiness on feet: Secondary | ICD-10-CM | POA: Diagnosis not present

## 2018-04-03 DIAGNOSIS — F0280 Dementia in other diseases classified elsewhere without behavioral disturbance: Secondary | ICD-10-CM | POA: Diagnosis not present

## 2018-04-03 DIAGNOSIS — F0390 Unspecified dementia without behavioral disturbance: Secondary | ICD-10-CM | POA: Diagnosis not present

## 2018-04-03 DIAGNOSIS — R1319 Other dysphagia: Secondary | ICD-10-CM | POA: Diagnosis not present

## 2018-04-03 DIAGNOSIS — J441 Chronic obstructive pulmonary disease with (acute) exacerbation: Secondary | ICD-10-CM | POA: Diagnosis not present

## 2018-04-03 DIAGNOSIS — I5023 Acute on chronic systolic (congestive) heart failure: Secondary | ICD-10-CM | POA: Diagnosis not present

## 2018-04-03 DIAGNOSIS — R41841 Cognitive communication deficit: Secondary | ICD-10-CM | POA: Diagnosis not present

## 2018-04-04 DIAGNOSIS — J441 Chronic obstructive pulmonary disease with (acute) exacerbation: Secondary | ICD-10-CM | POA: Diagnosis not present

## 2018-04-04 DIAGNOSIS — F0390 Unspecified dementia without behavioral disturbance: Secondary | ICD-10-CM | POA: Diagnosis not present

## 2018-04-04 DIAGNOSIS — R2681 Unsteadiness on feet: Secondary | ICD-10-CM | POA: Diagnosis not present

## 2018-04-04 DIAGNOSIS — R41841 Cognitive communication deficit: Secondary | ICD-10-CM | POA: Diagnosis not present

## 2018-04-04 DIAGNOSIS — I5023 Acute on chronic systolic (congestive) heart failure: Secondary | ICD-10-CM | POA: Diagnosis not present

## 2018-04-04 DIAGNOSIS — R1319 Other dysphagia: Secondary | ICD-10-CM | POA: Diagnosis not present

## 2018-04-04 DIAGNOSIS — F0280 Dementia in other diseases classified elsewhere without behavioral disturbance: Secondary | ICD-10-CM | POA: Diagnosis not present

## 2018-04-05 DIAGNOSIS — R2681 Unsteadiness on feet: Secondary | ICD-10-CM | POA: Diagnosis not present

## 2018-04-05 DIAGNOSIS — N183 Chronic kidney disease, stage 3 (moderate): Secondary | ICD-10-CM | POA: Diagnosis not present

## 2018-04-05 DIAGNOSIS — E46 Unspecified protein-calorie malnutrition: Secondary | ICD-10-CM | POA: Diagnosis not present

## 2018-04-05 DIAGNOSIS — F0390 Unspecified dementia without behavioral disturbance: Secondary | ICD-10-CM | POA: Diagnosis not present

## 2018-04-05 DIAGNOSIS — J449 Chronic obstructive pulmonary disease, unspecified: Secondary | ICD-10-CM | POA: Diagnosis not present

## 2018-04-05 DIAGNOSIS — R1319 Other dysphagia: Secondary | ICD-10-CM | POA: Diagnosis not present

## 2018-04-05 DIAGNOSIS — I4891 Unspecified atrial fibrillation: Secondary | ICD-10-CM | POA: Diagnosis not present

## 2018-04-05 DIAGNOSIS — F0391 Unspecified dementia with behavioral disturbance: Secondary | ICD-10-CM | POA: Diagnosis not present

## 2018-04-05 DIAGNOSIS — F322 Major depressive disorder, single episode, severe without psychotic features: Secondary | ICD-10-CM | POA: Diagnosis not present

## 2018-04-05 DIAGNOSIS — I10 Essential (primary) hypertension: Secondary | ICD-10-CM | POA: Diagnosis not present

## 2018-04-05 DIAGNOSIS — J441 Chronic obstructive pulmonary disease with (acute) exacerbation: Secondary | ICD-10-CM | POA: Diagnosis not present

## 2018-04-05 DIAGNOSIS — F0280 Dementia in other diseases classified elsewhere without behavioral disturbance: Secondary | ICD-10-CM | POA: Diagnosis not present

## 2018-04-05 DIAGNOSIS — I5023 Acute on chronic systolic (congestive) heart failure: Secondary | ICD-10-CM | POA: Diagnosis not present

## 2018-04-05 DIAGNOSIS — R41841 Cognitive communication deficit: Secondary | ICD-10-CM | POA: Diagnosis not present

## 2018-04-05 DIAGNOSIS — I2510 Atherosclerotic heart disease of native coronary artery without angina pectoris: Secondary | ICD-10-CM | POA: Diagnosis not present

## 2018-04-06 DIAGNOSIS — F0390 Unspecified dementia without behavioral disturbance: Secondary | ICD-10-CM | POA: Diagnosis not present

## 2018-04-06 DIAGNOSIS — R41841 Cognitive communication deficit: Secondary | ICD-10-CM | POA: Diagnosis not present

## 2018-04-06 DIAGNOSIS — R2681 Unsteadiness on feet: Secondary | ICD-10-CM | POA: Diagnosis not present

## 2018-04-06 DIAGNOSIS — R1319 Other dysphagia: Secondary | ICD-10-CM | POA: Diagnosis not present

## 2018-04-06 DIAGNOSIS — I5023 Acute on chronic systolic (congestive) heart failure: Secondary | ICD-10-CM | POA: Diagnosis not present

## 2018-04-06 DIAGNOSIS — J441 Chronic obstructive pulmonary disease with (acute) exacerbation: Secondary | ICD-10-CM | POA: Diagnosis not present

## 2018-04-06 DIAGNOSIS — F0280 Dementia in other diseases classified elsewhere without behavioral disturbance: Secondary | ICD-10-CM | POA: Diagnosis not present

## 2018-04-08 DIAGNOSIS — R41841 Cognitive communication deficit: Secondary | ICD-10-CM | POA: Diagnosis not present

## 2018-04-08 DIAGNOSIS — F0280 Dementia in other diseases classified elsewhere without behavioral disturbance: Secondary | ICD-10-CM | POA: Diagnosis not present

## 2018-04-08 DIAGNOSIS — F0390 Unspecified dementia without behavioral disturbance: Secondary | ICD-10-CM | POA: Diagnosis not present

## 2018-04-08 DIAGNOSIS — J441 Chronic obstructive pulmonary disease with (acute) exacerbation: Secondary | ICD-10-CM | POA: Diagnosis not present

## 2018-04-08 DIAGNOSIS — I5023 Acute on chronic systolic (congestive) heart failure: Secondary | ICD-10-CM | POA: Diagnosis not present

## 2018-04-08 DIAGNOSIS — R1319 Other dysphagia: Secondary | ICD-10-CM | POA: Diagnosis not present

## 2018-04-08 DIAGNOSIS — R2681 Unsteadiness on feet: Secondary | ICD-10-CM | POA: Diagnosis not present

## 2018-04-09 DIAGNOSIS — R2681 Unsteadiness on feet: Secondary | ICD-10-CM | POA: Diagnosis not present

## 2018-04-09 DIAGNOSIS — R1319 Other dysphagia: Secondary | ICD-10-CM | POA: Diagnosis not present

## 2018-04-09 DIAGNOSIS — I5023 Acute on chronic systolic (congestive) heart failure: Secondary | ICD-10-CM | POA: Diagnosis not present

## 2018-04-09 DIAGNOSIS — R41841 Cognitive communication deficit: Secondary | ICD-10-CM | POA: Diagnosis not present

## 2018-04-09 DIAGNOSIS — F0390 Unspecified dementia without behavioral disturbance: Secondary | ICD-10-CM | POA: Diagnosis not present

## 2018-04-09 DIAGNOSIS — F0280 Dementia in other diseases classified elsewhere without behavioral disturbance: Secondary | ICD-10-CM | POA: Diagnosis not present

## 2018-04-09 DIAGNOSIS — J441 Chronic obstructive pulmonary disease with (acute) exacerbation: Secondary | ICD-10-CM | POA: Diagnosis not present

## 2018-04-10 DIAGNOSIS — J441 Chronic obstructive pulmonary disease with (acute) exacerbation: Secondary | ICD-10-CM | POA: Diagnosis not present

## 2018-04-10 DIAGNOSIS — R1319 Other dysphagia: Secondary | ICD-10-CM | POA: Diagnosis not present

## 2018-04-10 DIAGNOSIS — R41841 Cognitive communication deficit: Secondary | ICD-10-CM | POA: Diagnosis not present

## 2018-04-10 DIAGNOSIS — F0280 Dementia in other diseases classified elsewhere without behavioral disturbance: Secondary | ICD-10-CM | POA: Diagnosis not present

## 2018-04-10 DIAGNOSIS — R2681 Unsteadiness on feet: Secondary | ICD-10-CM | POA: Diagnosis not present

## 2018-04-10 DIAGNOSIS — F0390 Unspecified dementia without behavioral disturbance: Secondary | ICD-10-CM | POA: Diagnosis not present

## 2018-04-10 DIAGNOSIS — I5023 Acute on chronic systolic (congestive) heart failure: Secondary | ICD-10-CM | POA: Diagnosis not present

## 2018-04-11 DIAGNOSIS — I5023 Acute on chronic systolic (congestive) heart failure: Secondary | ICD-10-CM | POA: Diagnosis not present

## 2018-04-11 DIAGNOSIS — R1319 Other dysphagia: Secondary | ICD-10-CM | POA: Diagnosis not present

## 2018-04-11 DIAGNOSIS — R2681 Unsteadiness on feet: Secondary | ICD-10-CM | POA: Diagnosis not present

## 2018-04-11 DIAGNOSIS — F0280 Dementia in other diseases classified elsewhere without behavioral disturbance: Secondary | ICD-10-CM | POA: Diagnosis not present

## 2018-04-11 DIAGNOSIS — R41841 Cognitive communication deficit: Secondary | ICD-10-CM | POA: Diagnosis not present

## 2018-04-11 DIAGNOSIS — J441 Chronic obstructive pulmonary disease with (acute) exacerbation: Secondary | ICD-10-CM | POA: Diagnosis not present

## 2018-04-11 DIAGNOSIS — F0390 Unspecified dementia without behavioral disturbance: Secondary | ICD-10-CM | POA: Diagnosis not present

## 2018-04-12 DIAGNOSIS — R1319 Other dysphagia: Secondary | ICD-10-CM | POA: Diagnosis not present

## 2018-04-12 DIAGNOSIS — J441 Chronic obstructive pulmonary disease with (acute) exacerbation: Secondary | ICD-10-CM | POA: Diagnosis not present

## 2018-04-12 DIAGNOSIS — F0390 Unspecified dementia without behavioral disturbance: Secondary | ICD-10-CM | POA: Diagnosis not present

## 2018-04-12 DIAGNOSIS — F0280 Dementia in other diseases classified elsewhere without behavioral disturbance: Secondary | ICD-10-CM | POA: Diagnosis not present

## 2018-04-12 DIAGNOSIS — R41841 Cognitive communication deficit: Secondary | ICD-10-CM | POA: Diagnosis not present

## 2018-04-12 DIAGNOSIS — I5023 Acute on chronic systolic (congestive) heart failure: Secondary | ICD-10-CM | POA: Diagnosis not present

## 2018-04-12 DIAGNOSIS — R2681 Unsteadiness on feet: Secondary | ICD-10-CM | POA: Diagnosis not present

## 2018-04-13 DIAGNOSIS — R41841 Cognitive communication deficit: Secondary | ICD-10-CM | POA: Diagnosis not present

## 2018-04-13 DIAGNOSIS — F0280 Dementia in other diseases classified elsewhere without behavioral disturbance: Secondary | ICD-10-CM | POA: Diagnosis not present

## 2018-04-13 DIAGNOSIS — J441 Chronic obstructive pulmonary disease with (acute) exacerbation: Secondary | ICD-10-CM | POA: Diagnosis not present

## 2018-04-13 DIAGNOSIS — R1319 Other dysphagia: Secondary | ICD-10-CM | POA: Diagnosis not present

## 2018-04-13 DIAGNOSIS — I5023 Acute on chronic systolic (congestive) heart failure: Secondary | ICD-10-CM | POA: Diagnosis not present

## 2018-04-13 DIAGNOSIS — F0390 Unspecified dementia without behavioral disturbance: Secondary | ICD-10-CM | POA: Diagnosis not present

## 2018-04-13 DIAGNOSIS — R2681 Unsteadiness on feet: Secondary | ICD-10-CM | POA: Diagnosis not present

## 2018-04-16 DIAGNOSIS — F0280 Dementia in other diseases classified elsewhere without behavioral disturbance: Secondary | ICD-10-CM | POA: Diagnosis not present

## 2018-04-16 DIAGNOSIS — R1319 Other dysphagia: Secondary | ICD-10-CM | POA: Diagnosis not present

## 2018-04-16 DIAGNOSIS — I5023 Acute on chronic systolic (congestive) heart failure: Secondary | ICD-10-CM | POA: Diagnosis not present

## 2018-04-16 DIAGNOSIS — F0390 Unspecified dementia without behavioral disturbance: Secondary | ICD-10-CM | POA: Diagnosis not present

## 2018-04-16 DIAGNOSIS — J441 Chronic obstructive pulmonary disease with (acute) exacerbation: Secondary | ICD-10-CM | POA: Diagnosis not present

## 2018-04-16 DIAGNOSIS — R41841 Cognitive communication deficit: Secondary | ICD-10-CM | POA: Diagnosis not present

## 2018-04-16 DIAGNOSIS — R2681 Unsteadiness on feet: Secondary | ICD-10-CM | POA: Diagnosis not present

## 2018-04-17 DIAGNOSIS — I5023 Acute on chronic systolic (congestive) heart failure: Secondary | ICD-10-CM | POA: Diagnosis not present

## 2018-04-17 DIAGNOSIS — J441 Chronic obstructive pulmonary disease with (acute) exacerbation: Secondary | ICD-10-CM | POA: Diagnosis not present

## 2018-04-17 DIAGNOSIS — R1319 Other dysphagia: Secondary | ICD-10-CM | POA: Diagnosis not present

## 2018-04-17 DIAGNOSIS — R2681 Unsteadiness on feet: Secondary | ICD-10-CM | POA: Diagnosis not present

## 2018-04-17 DIAGNOSIS — F0280 Dementia in other diseases classified elsewhere without behavioral disturbance: Secondary | ICD-10-CM | POA: Diagnosis not present

## 2018-04-17 DIAGNOSIS — F0390 Unspecified dementia without behavioral disturbance: Secondary | ICD-10-CM | POA: Diagnosis not present

## 2018-04-17 DIAGNOSIS — R41841 Cognitive communication deficit: Secondary | ICD-10-CM | POA: Diagnosis not present

## 2018-04-18 DIAGNOSIS — R2681 Unsteadiness on feet: Secondary | ICD-10-CM | POA: Diagnosis not present

## 2018-04-18 DIAGNOSIS — R1319 Other dysphagia: Secondary | ICD-10-CM | POA: Diagnosis not present

## 2018-04-18 DIAGNOSIS — F0280 Dementia in other diseases classified elsewhere without behavioral disturbance: Secondary | ICD-10-CM | POA: Diagnosis not present

## 2018-04-18 DIAGNOSIS — F0390 Unspecified dementia without behavioral disturbance: Secondary | ICD-10-CM | POA: Diagnosis not present

## 2018-04-18 DIAGNOSIS — J441 Chronic obstructive pulmonary disease with (acute) exacerbation: Secondary | ICD-10-CM | POA: Diagnosis not present

## 2018-04-18 DIAGNOSIS — R41841 Cognitive communication deficit: Secondary | ICD-10-CM | POA: Diagnosis not present

## 2018-04-18 DIAGNOSIS — I5023 Acute on chronic systolic (congestive) heart failure: Secondary | ICD-10-CM | POA: Diagnosis not present

## 2018-04-19 DIAGNOSIS — J441 Chronic obstructive pulmonary disease with (acute) exacerbation: Secondary | ICD-10-CM | POA: Diagnosis not present

## 2018-04-19 DIAGNOSIS — F0390 Unspecified dementia without behavioral disturbance: Secondary | ICD-10-CM | POA: Diagnosis not present

## 2018-04-19 DIAGNOSIS — I5023 Acute on chronic systolic (congestive) heart failure: Secondary | ICD-10-CM | POA: Diagnosis not present

## 2018-04-19 DIAGNOSIS — R41841 Cognitive communication deficit: Secondary | ICD-10-CM | POA: Diagnosis not present

## 2018-04-19 DIAGNOSIS — R2681 Unsteadiness on feet: Secondary | ICD-10-CM | POA: Diagnosis not present

## 2018-04-19 DIAGNOSIS — F0280 Dementia in other diseases classified elsewhere without behavioral disturbance: Secondary | ICD-10-CM | POA: Diagnosis not present

## 2018-04-19 DIAGNOSIS — R1319 Other dysphagia: Secondary | ICD-10-CM | POA: Diagnosis not present

## 2018-05-03 ENCOUNTER — Other Ambulatory Visit: Payer: Self-pay | Admitting: Physician Assistant

## 2018-05-14 ENCOUNTER — Encounter: Payer: Medicare HMO | Admitting: Internal Medicine

## 2018-06-29 ENCOUNTER — Ambulatory Visit: Payer: Medicare HMO | Admitting: Neurology

## 2019-01-31 ENCOUNTER — Telehealth: Payer: Self-pay | Admitting: Cardiology

## 2019-01-31 ENCOUNTER — Encounter: Payer: Self-pay | Admitting: Internal Medicine

## 2019-01-31 NOTE — Telephone Encounter (Signed)
LMOVM for pt to return call. Can we order patient a new monitor and cell adapter and have nursing staff at facility send transmission.

## 2019-02-14 IMAGING — DX DG CHEST 2V
2 series · 2 of 2 positions shown · non-contrast
Comparison: 05/18/2016 chest radiographs and earlier.

CLINICAL DATA: 87-year-old female with shortness of breath which
woke her from sleep.

EXAM:
CHEST - 2 VIEW

[chest ap]
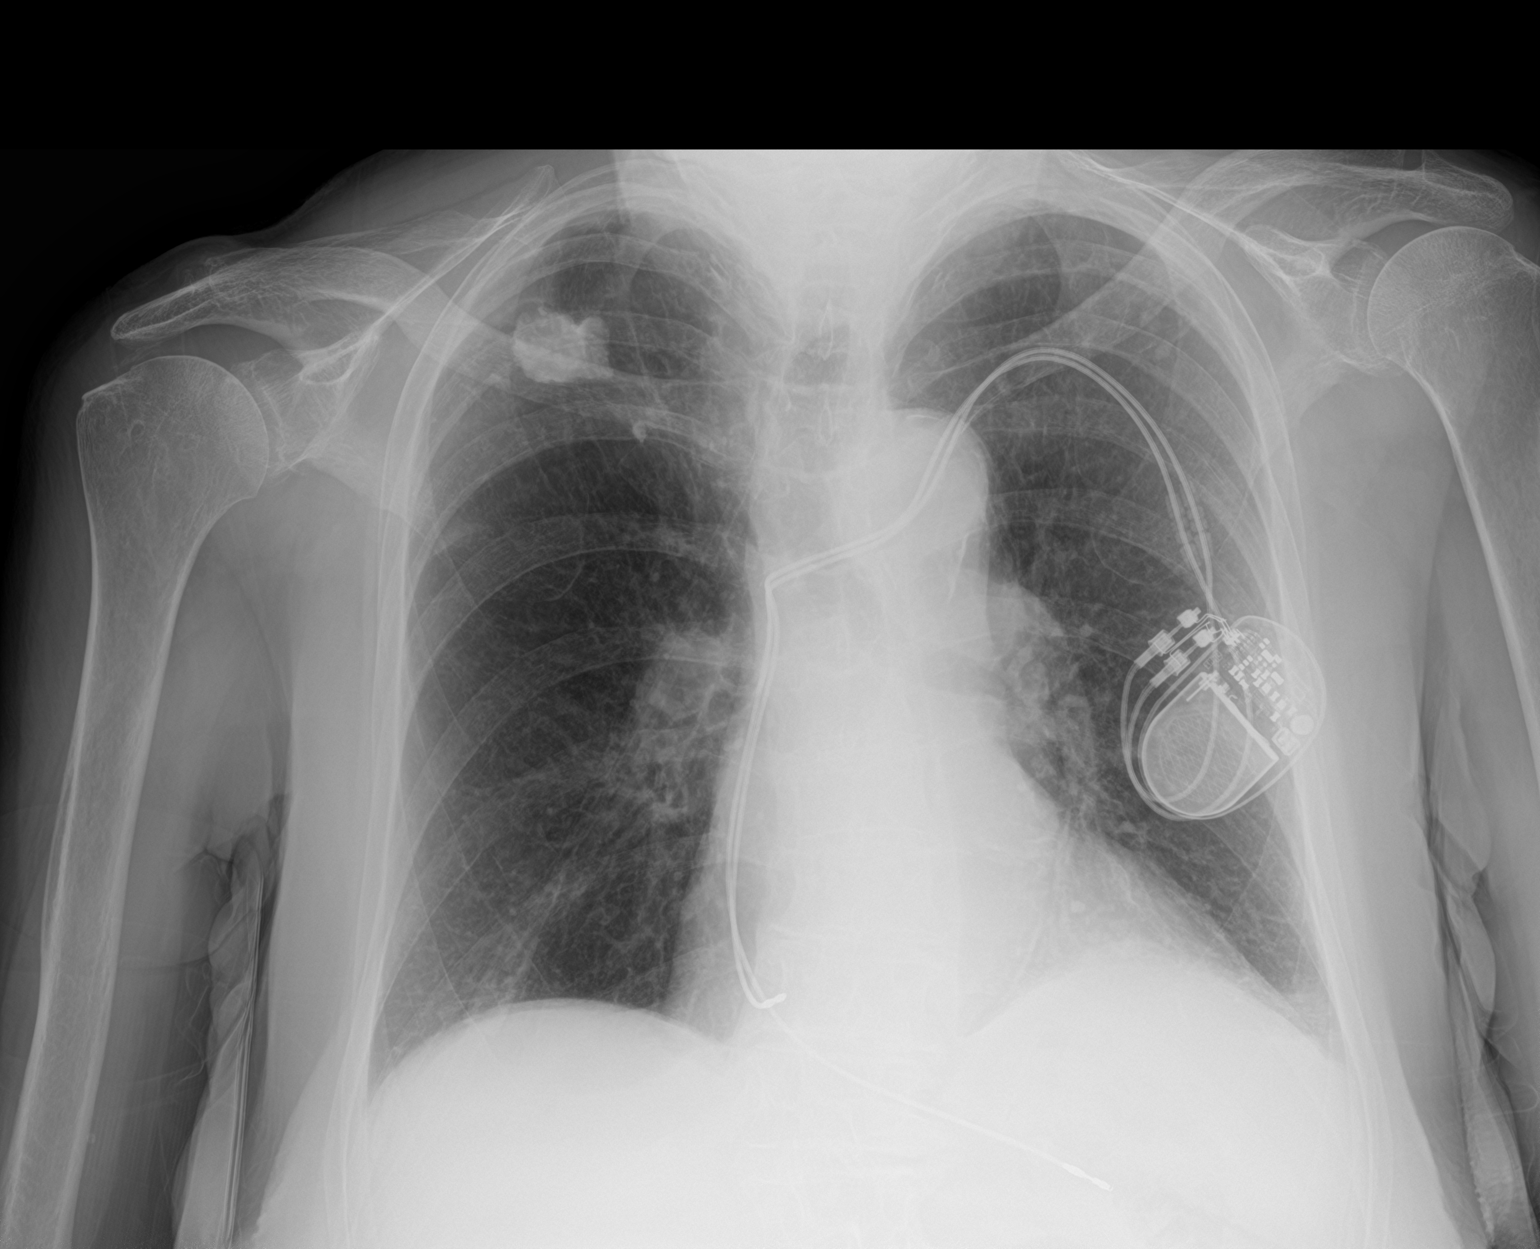

[chest lat]
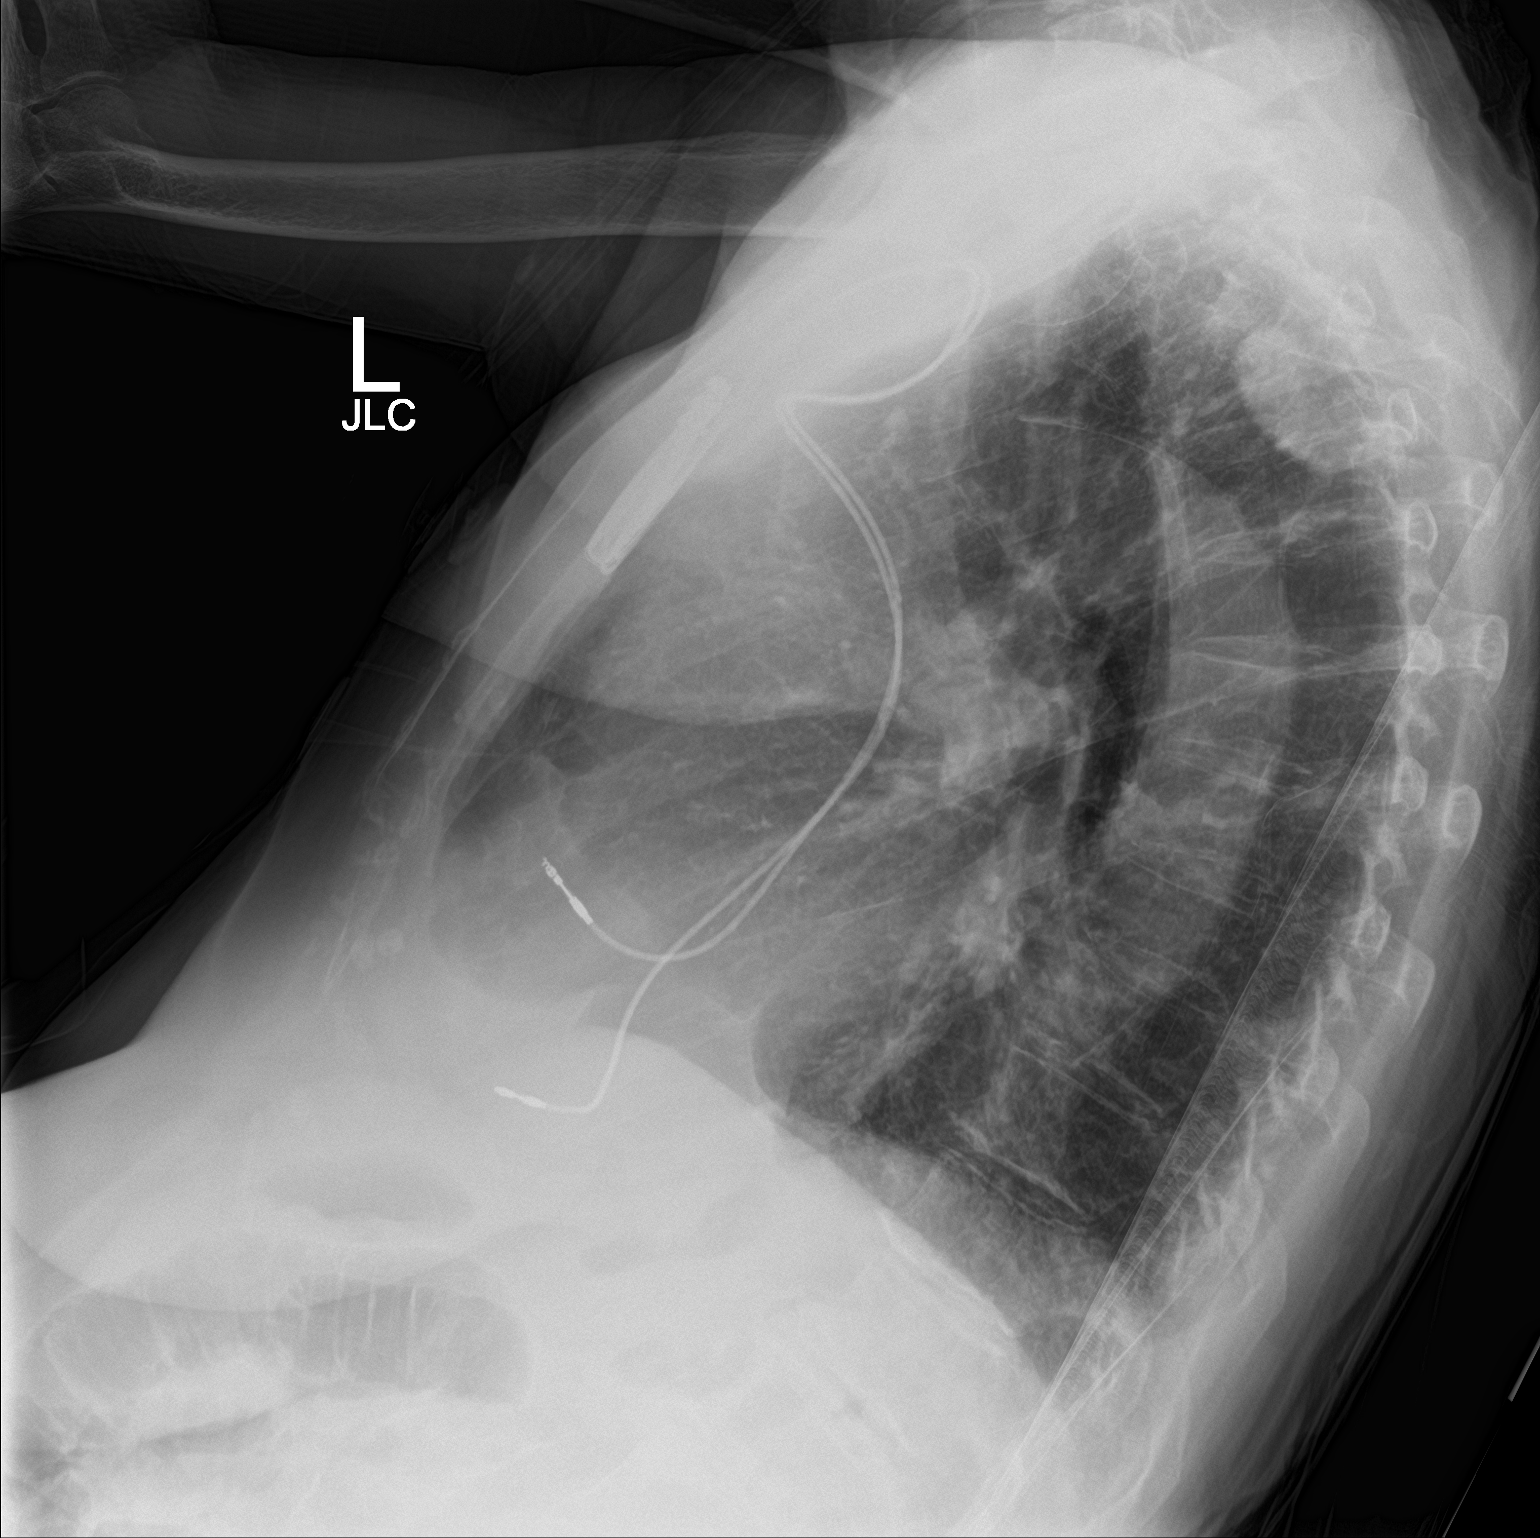

[2 of 2 positions shown; findings below may reference images not displayed]

FINDINGS: Upright AP and lateral views of the chest. Chronic large right upper
lung granuloma is unchanged since 7003. There are smaller adjacent
calcified granulomas. Chronic pulmonary hyperinflation with
centrilobular emphysema demonstrated by CT in 7003. Mildly lower
lung volumes today. No pneumothorax, pulmonary edema, pleural
effusion or confluent pulmonary opacity. Stable left chest dual lead
cardiac pacemaker. Stable cardiac size and mediastinal contours.
Mildly tortuous thoracic aorta. No acute osseous abnormality
identified. Negative visible bowel gas pattern.
IMPRESSION: 1.  No acute cardiopulmonary abnormality.
2. Chronic Emphysema ([id number]).

## 2019-02-19 ENCOUNTER — Telehealth: Payer: Self-pay

## 2019-02-19 NOTE — Telephone Encounter (Signed)
I tried to call patient and leave a message about setting up telehealth visit but patients voicemail kept cutting me off.

## 2019-03-01 NOTE — Telephone Encounter (Signed)
Patient did not call back and schedule follow up appointment. 

## 2019-04-29 NOTE — Progress Notes (Deleted)
Cardiology Office Note Date:  04/29/2019  Patient ID:  Mikayla Harper, DOB 17-Mar-1930, MRN [id number] PCP:  Haywood Pao, MD  Cardiologist:  ***  ***refresh   Chief Complaint: *** 3 mo f/u  History of Present Illness: Mikayla Harper is a 85 y.o. female with history of Persistent AFib, prior TIA, recurrent GI bleeds 2/2 AVM's, eventually requiring stop of a/c, tachy-brady w/PPM, chronic CHF (systolic), HTN.  She comes in today to be seen for Dr. Caryl Comes.  Her last cardiology visit was with Kathleen Argue, [place] may 2019.  At that time she had recent hospitalization with NSTEMi treated conservatively 2/2 her dementia.  She was accompanied by hrer daughter-in-law who is a Therapist, sports and monitoring her closely with stable weights and vitals.   Mention of orthostatic dizziness and BP's hanging about 100 with no room for add on ARB (ACE intolerance) and maintained on BB and diuretic for CHF management.  In d/w Dr. Caryl Comes, her Propafenone stopped given the MI and planned for amiodarone if recurrent/symptomatic AF occurred.  *** meds *** fluid status *** AFib burden *** symptoms *** remotes?   *** SNF?    AFib hx AAD Propafenone > stopped May 2019 after NSTEMI ATach noted on historical device check as well  Device information SJM dual chamber PPM, implanted 10/21/2011, tachy-brady, Dr. Caryl Comes   Past Medical History:  Diagnosis Date  . Anemia   . Carpal tunnel syndrome of left wrist 12/20/2010   left hand  . Dementia   . Hyperlipidemia    on Mevacor  . Influenza-like illness 2009   H1N1 PCR negative  . Osteopenia   . Pacemaker - [facility]    Implant Jan 2013  . PAF (paroxysmal atrial fibrillation) ([place])    previously on propafenone  . Sick sinus syndrome ([place])   . TIA (transient ischemic attack) Dec 2012  . Tuberculosis     hx of over 50 years ago "  . Urinary tract infection     Past Surgical History:  Procedure Laterality Date  . ABDOMINAL HYSTERECTOMY    . BACK SURGERY  1976  .  CARDIOVERSION N/A 11/13/2012   Procedure: CARDIOVERSION;  Surgeon: Peter M Martinique, MD;  Location: [facility] ENDOSCOPY;  Service: Cardiovascular;  Laterality: N/A;  . COLONOSCOPY  08/01/2012   Procedure: COLONOSCOPY;  Surgeon: Milus Banister, MD;  Location: [facility] ENDOSCOPY;  Service: Endoscopy;  Laterality: N/A;  . ESOPHAGOGASTRODUODENOSCOPY  08/01/2012   Procedure: ESOPHAGOGASTRODUODENOSCOPY (EGD);  Surgeon: Milus Banister, MD;  Location: [facility] ENDOSCOPY;  Service: Endoscopy;  Laterality: N/A;  . INSERT / REPLACE / REMOVE PACEMAKER  10/21/2011  . LUMBAR FUSION N/A   . OTHER SURGICAL HISTORY  In the late 1960's   Hysterectomy secondary to fibroids  . OTHER SURGICAL HISTORY     cataract surgery  . PERMANENT PACEMAKER INSERTION N/A 10/21/2011   Procedure: PERMANENT PACEMAKER INSERTION;  Surgeon: Deboraha Sprang, MD;  Location: [facility] CATH LAB;  Service: Cardiovascular;  Laterality: N/A;    Current Outpatient Medications  Medication Sig Dispense Refill  . albuterol (PROVENTIL HFA;VENTOLIN HFA) 108 (90 BASE) MCG/ACT inhaler Inhale 2 puffs into the lungs every 6 (six) hours as needed for wheezing.    Marland Kitchen aspirin EC 81 MG EC tablet Take 1 tablet (81 mg total) by mouth daily.    . carvedilol (COREG) 3.125 MG tablet TAKE 1 TABLET BY MOUTH TWICE A DAY WITH A MEAL 180 tablet 1  . donepezil (ARICEPT) 10 MG tablet Take 1  tablet daily 30 tablet 6  . ferrous sulfate 325 (65 FE) MG EC tablet Take 325 mg by mouth 2 (two) times daily with a meal.    . furosemide (LASIX) 20 MG tablet Take one tablet 3x a week, on Monday, Wednesday and Friday. 30 tablet 1  . guaiFENesin (MUCINEX) 600 MG 12 hr tablet Take 600 mg by mouth daily.     Marland Kitchen ipratropium-albuterol (DUONEB) 0.5-2.5 (3) MG/3ML SOLN Take 3 mLs by nebulization 4 (four) times daily.    Marland Kitchen lovastatin (MEVACOR) 20 MG tablet Take 20 mg by mouth at bedtime.    . mirtazapine (REMERON) 15 MG tablet Take 1 tablet by mouth at bedtime.    . Multiple Vitamins-Minerals (WOMENS MULTIVITAMIN  PLUS PO) Take 1 tablet by mouth daily.    . nitroGLYCERIN (NITROSTAT) 0.4 MG SL tablet Place 1 tablet (0.4 mg total) under the tongue every 5 (five) minutes x 3 doses as needed for chest pain. 25 tablet 12  . oxybutynin (DITROPAN) 5 MG tablet Take 5 mg by mouth daily.      No current facility-administered medications for this visit.     Allergies:   Lisinopril   Social History:  The patient  reports that she quit smoking about 30 years ago. Her smoking use included cigarettes. She has a 30.00 pack-year smoking history. She has never used smokeless tobacco. She reports that she does not drink alcohol or use drugs.   Family History:  The patient's family history includes COPD in her sister; Cancer in her daughter; Dementia in her mother; Emphysema in her father; Heart disease in her brother and father; Parkinson's disease in her sister.  ROS:  Please see the history of present illness.  All other systems are reviewed and otherwise negative.   PHYSICAL EXAM: *** VS:  There were no vitals taken for this visit. BMI: There is no height or weight on file to calculate BMI. Well nourished, well developed, in no acute distress  HEENT: normocephalic, atraumatic  Neck: no JVD, carotid bruits or masses Cardiac:  *** RRR; no significant murmurs, no rubs, or gallops Lungs:  *** CTA b/l, no wheezing, rhonchi or rales  Abd: soft, nontender MS: no deformity or atrophy Ext: *** no edema  Skin: warm and dry, no rash Neuro:  No gross deficits appreciated Psych: euthymic mood, full affect  *** PPM/ICD site is stable, no tethering or discomfort   EKG:  Done today and reviewed by myself shows ***  PPM interrogation done today and reviewed by myself: ***   Echocardiogram 01/30/2018 EF 40-45, apical hypokinesis, grade 1 diastolic dysfunction, mild AI, trivial effusion  Carotid US 11/02/2011 R 40-59; L 0-39 >> follow-up 1 year  Echo 10/20/2011 Mild focal basal septal hypertrophy, EF 55, grade 1  diastolic dysfunction, mild AI  Recent Labs: No results found for requested labs within last 8760 hours.  No results found for requested labs within last 8760 hours.   CrCl cannot be calculated (Patient's most recent lab result is older than the maximum 21 days allowed.).   Wt Readings from Last 3 Encounters:  02/14/18 143 lb (64.9 kg)  02/13/18 141 lb 9.6 oz (64.2 kg)  02/05/18 144 lb 9.6 oz (65.6 kg)     Other studies reviewed: Additional studies/records reviewed today include: summarized above  ASSESSMENT AND PLAN:  1. Persistent Afib     Off a/c 2/2 recurrent GIBs     *** burden     *** symptoms  2. NSTEMI (presumed  CAD) 2019     Treated conservatively       *** symptoms     *** meds  3. Chronic CHF (systolic)     *** meds     *** fluid status   4. HTN     Relative hypotension on meds     Disposition: F/u with ***  Current medicines are reviewed at length with the patient today.  The patient did not have any concerns regarding medicines.***  Venetia Night, PA-C 04/29/2019 6:29 AM     [facility] [address] [place] [address] [phone] (office)  [phone] (fax)

## 2019-04-30 ENCOUNTER — Encounter: Payer: Self-pay | Admitting: Physician Assistant

## 2019-08-27 ENCOUNTER — Encounter: Payer: Medicare Other | Admitting: Physician Assistant

## 2020-01-07 ENCOUNTER — Encounter: Payer: Self-pay | Admitting: Internal Medicine

## 2020-01-07 ENCOUNTER — Ambulatory Visit (INDEPENDENT_AMBULATORY_CARE_PROVIDER_SITE_OTHER): Payer: Medicare Other | Admitting: Internal Medicine

## 2020-01-07 ENCOUNTER — Other Ambulatory Visit: Payer: Self-pay

## 2020-01-07 VITALS — BP 102/78 | HR 83 | Ht 63.0 in | Wt 143.0 lb

## 2020-01-07 DIAGNOSIS — Z950 Presence of cardiac pacemaker: Secondary | ICD-10-CM

## 2020-01-07 DIAGNOSIS — I480 Paroxysmal atrial fibrillation: Secondary | ICD-10-CM

## 2020-01-07 DIAGNOSIS — I495 Sick sinus syndrome: Secondary | ICD-10-CM

## 2020-01-07 NOTE — Patient Instructions (Signed)

## 2020-01-07 NOTE — Progress Notes (Signed)
Patient Care Team: Tisovec, Fransico Him, MD as PCP - General Caryl Comes Revonda Standard, MD as PCP - Cardiology (Cardiology)   HPI  Mikayla Harper is a 86 y.o. female  Seen in followup for a pacemaker implanted January 2013 for tachybradycardia syndrome with paroxysmal atrial fibrillation as well as prolonged pauses;   She has had intercurrent problems with GI bleeding attributed to AVMs and her Rivaroxaban  was discontinued   She has had what was felt to be a recent TIA.   Intercurrent MI, treated conservatively (2019)   The patient denies chest pain, shortness of breath, nocturnal dyspnea, orthopnea or peripheral edema.  There have been no palpitations, lightheadedness or syncope.      DATE TEST EF   1/13 Echo   55-65 %   5/19 Echo   40-45 %          CHADS2-VASc=6 (female, age x 2, CVA, CHF).      Date Cr K Hgb  5/19 1.19 4.4 8.2  11/19    12  2/21 0.91 4.7 11.1      Past Medical History:  Diagnosis Date  . Anemia   . Carpal tunnel syndrome of left wrist 12/20/2010   left hand  . Dementia ([place])   . Hyperlipidemia    on Mevacor  . Influenza-like illness 2009   H1N1 PCR negative  . Osteopenia   . Pacemaker - [facility]    Implant Jan 2013  . PAF (paroxysmal atrial fibrillation) ([place])    previously on propafenone  . Sick sinus syndrome ([place])   . TIA (transient ischemic attack) Dec 2012  . Tuberculosis     hx of over 50 years ago "  . Urinary tract infection     Past Surgical History:  Procedure Laterality Date  . ABDOMINAL HYSTERECTOMY    . BACK SURGERY  1976  . CARDIOVERSION N/A 11/13/2012   Procedure: CARDIOVERSION;  Surgeon: Peter M Martinique, MD;  Location: [facility] ENDOSCOPY;  Service: Cardiovascular;  Laterality: N/A;  . COLONOSCOPY  08/01/2012   Procedure: COLONOSCOPY;  Surgeon: Milus Banister, MD;  Location: [facility] ENDOSCOPY;  Service: Endoscopy;  Laterality: N/A;  . ESOPHAGOGASTRODUODENOSCOPY  08/01/2012   Procedure: ESOPHAGOGASTRODUODENOSCOPY (EGD);  Surgeon:  Milus Banister, MD;  Location: [facility] ENDOSCOPY;  Service: Endoscopy;  Laterality: N/A;  . INSERT / REPLACE / REMOVE PACEMAKER  10/21/2011  . LUMBAR FUSION N/A   . OTHER SURGICAL HISTORY  In the late 1960's   Hysterectomy secondary to fibroids  . OTHER SURGICAL HISTORY     cataract surgery  . PERMANENT PACEMAKER INSERTION N/A 10/21/2011   Procedure: PERMANENT PACEMAKER INSERTION;  Surgeon: Deboraha Sprang, MD;  Location: [facility] CATH LAB;  Service: Cardiovascular;  Laterality: N/A;    Current Outpatient Medications  Medication Sig Dispense Refill  . albuterol (PROVENTIL HFA;VENTOLIN HFA) 108 (90 BASE) MCG/ACT inhaler Inhale 2 puffs into the lungs every 6 (six) hours as needed for wheezing.    Marland Kitchen aspirin EC 81 MG EC tablet Take 1 tablet (81 mg total) by mouth daily.    . carvedilol (COREG) 3.125 MG tablet TAKE 1 TABLET BY MOUTH TWICE A DAY WITH A MEAL 180 tablet 1  . donepezil (ARICEPT) 10 MG tablet Take 1 tablet daily 30 tablet 6  . famotidine (PEPCID) 20 MG tablet Take 20 mg by mouth daily.    . ferrous sulfate 325 (65 FE) MG EC tablet Take 325 mg by mouth 2 (two) times daily  with a meal.    . furosemide (LASIX) 20 MG tablet Take one tablet 3x a week, on Monday, Wednesday and Friday. 30 tablet 1  . guaiFENesin (MUCINEX) 600 MG 12 hr tablet Take 600 mg by mouth daily.     Marland Kitchen HYDROcodone-acetaminophen (NORCO/VICODIN) 5-325 MG tablet Take as directed    . ipratropium-albuterol (DUONEB) 0.5-2.5 (3) MG/3ML SOLN Take 3 mLs by nebulization 4 (four) times daily.    . memantine (NAMENDA) 10 MG tablet Take 10 mg by mouth daily.    . Multiple Vitamins-Minerals (WOMENS MULTIVITAMIN PLUS PO) Take 1 tablet by mouth daily.    . nitroGLYCERIN (NITROSTAT) 0.4 MG SL tablet Place 1 tablet (0.4 mg total) under the tongue every 5 (five) minutes x 3 doses as needed for chest pain. 25 tablet 12  . oxybutynin (DITROPAN) 5 MG tablet Take 5 mg by mouth daily.     Marland Kitchen atorvastatin (LIPITOR) 10 MG tablet Take 10 mg by mouth  daily.    . busPIRone (BUSPAR) 5 MG tablet Take 5 mg by mouth 2 (two) times daily.     No current facility-administered medications for this visit.    Allergies  Allergen Reactions  . Lisinopril Other (See Comments)    Dizzy and lightheadedness    Review of Systems negative except from HPI and PMH  Physical Exam BP 102/78   Pulse 83   Ht 5\' 3"  (1.6 m)   Wt 143 lb (64.9 kg)   SpO2 98%   BMI 25.33 kg/m  Well developed and well nourished in no acute distress HENT normal Neck supple with JVP-flat Clear Device pocket well healed; without hematoma or erythema.  There is no tethering  Regular rate and rhythm, no   murmur Abd-soft with active BS No Clubbing cyanosis  edema Skin-warm and dry A & Oriented  Grossly normal sensory and motor function  ECG atrial fib@ 83 -/.07/38     Assessment and  Plan  Atrial fibrillation-permanent   Pacemaker-[employer] The patient's device was interrogated and the information was fully reviewed.  The device was reprogrammed to VVI  CAD  S/p MI  Anemia   Without symptoms of ischemia   No bleeding issues   Hgb stable As above   Atrial fib rates are fast 13%> 110;  Given low BP rate control will be difficult-- she has scant symptoms, but with her hx of cardiomyopathy noted in 2019 would favor trying to slow her down a touch. Considered dig but not able to Rx 0.0625 so rather tahn use qod, prone to mistakes will hold off and wait for symptoms   Given CAD will continue ASA, esp given stable Hgb

## 2020-01-14 ENCOUNTER — Telehealth: Payer: Self-pay

## 2020-01-14 NOTE — Telephone Encounter (Signed)
Nurse from [facility] called for help on how to send a transmission from monitor that they have just received for her. Nurse may or may not call back if she needs additional help.

## 2020-01-24 LAB — CUP PACEART INCLINIC DEVICE CHECK
Battery Remaining Longevity: 39 mo
Battery Voltage: 2.83 V
Brady Statistic RA Percent Paced: 28 %
Brady Statistic RV Percent Paced: 14 %
Date Time Interrogation Session: 20210413132300
Implantable Lead Implant Date: 20130125
Implantable Lead Implant Date: 20130125
Implantable Lead Location: 753859
Implantable Lead Location: 753860
Implantable Pulse Generator Implant Date: 20130125
Lead Channel Impedance Value: 337.5 Ohm
Lead Channel Impedance Value: 412.5 Ohm
Lead Channel Pacing Threshold Amplitude: 1.25 V
Lead Channel Pacing Threshold Pulse Width: 1 ms
Lead Channel Sensing Intrinsic Amplitude: 1.1 mV
Lead Channel Sensing Intrinsic Amplitude: 5.6 mV
Lead Channel Setting Pacing Amplitude: 2.5 V
Lead Channel Setting Pacing Pulse Width: 1 ms
Lead Channel Setting Sensing Sensitivity: 2 mV
Pulse Gen Model: 2110
Pulse Gen Serial Number: 7300824

## 2020-01-24 NOTE — Telephone Encounter (Signed)
Attempted to reach patient's nurse at [facility] [place]. No answer at nurses' station.

## 2020-01-30 NOTE — Telephone Encounter (Signed)
Spoke with patient's nurse, [place]. She agrees to attempt PPM transmission to setup monitor. Transmission instructions provided.

## 2020-01-31 NOTE — Telephone Encounter (Signed)
Spoke with patient's nurse. She requests call back one day next week at 11:00am for assistance with monitor setup.

## 2020-02-12 NOTE — Telephone Encounter (Signed)
Spoke with patient's nurse. She requests call back tomorrow morning to assist with transmission as they are short-staffed for nursing coverage today.

## 2020-02-13 NOTE — Telephone Encounter (Signed)
Attempted to reach staff at [facility]. No answer, phone rang continuously, no option to LM.

## 2020-02-26 ENCOUNTER — Ambulatory Visit (INDEPENDENT_AMBULATORY_CARE_PROVIDER_SITE_OTHER): Payer: Medicare Other | Admitting: *Deleted

## 2020-02-26 DIAGNOSIS — I495 Sick sinus syndrome: Secondary | ICD-10-CM

## 2020-02-26 DIAGNOSIS — I480 Paroxysmal atrial fibrillation: Secondary | ICD-10-CM

## 2020-02-26 LAB — CUP PACEART REMOTE DEVICE CHECK
Battery Remaining Longevity: 47 mo
Battery Remaining Percentage: 40 %
Battery Voltage: 2.84 V
Brady Statistic RV Percent Paced: 16 %
Date Time Interrogation Session: 20210602112747
Implantable Lead Implant Date: 20130125
Implantable Lead Implant Date: 20130125
Implantable Lead Location: 753859
Implantable Lead Location: 753860
Implantable Pulse Generator Implant Date: 20130125
Lead Channel Impedance Value: 400 Ohm
Lead Channel Pacing Threshold Amplitude: 1.25 V
Lead Channel Pacing Threshold Pulse Width: 1 ms
Lead Channel Sensing Intrinsic Amplitude: 5.2 mV
Lead Channel Setting Pacing Amplitude: 2.5 V
Lead Channel Setting Pacing Pulse Width: 1 ms
Lead Channel Setting Sensing Sensitivity: 2 mV
Pulse Gen Model: 2110
Pulse Gen Serial Number: 7300824

## 2020-02-26 NOTE — Telephone Encounter (Signed)
Spoke with patient's nurse, Thurmond Butts, to assist with transmission. Unable to troubleshoot monitor. Conference Scientist, research (medical) support. After extensive troubleshooting, transmission received and added to schedule for processing. Ryan aware that next transmission is now due on 05/27/20. She denies any additional questions at this time.

## 2020-02-27 NOTE — Progress Notes (Signed)
Remote pacemaker transmission.   

## 2020-11-26 ENCOUNTER — Telehealth: Payer: Self-pay

## 2020-11-26 NOTE — Telephone Encounter (Signed)
Left message for patient to remind of missed remote transmission.  

## 2021-01-07 ENCOUNTER — Telehealth: Payer: Self-pay

## 2021-01-07 ENCOUNTER — Encounter: Payer: Self-pay | Admitting: Student

## 2021-01-07 ENCOUNTER — Other Ambulatory Visit: Payer: Self-pay

## 2021-01-07 ENCOUNTER — Ambulatory Visit (INDEPENDENT_AMBULATORY_CARE_PROVIDER_SITE_OTHER): Payer: Medicare Other | Admitting: Student

## 2021-01-07 VITALS — BP 96/66 | HR 69 | Ht 66.0 in | Wt 127.6 lb

## 2021-01-07 DIAGNOSIS — I480 Paroxysmal atrial fibrillation: Secondary | ICD-10-CM | POA: Diagnosis not present

## 2021-01-07 DIAGNOSIS — I495 Sick sinus syndrome: Secondary | ICD-10-CM

## 2021-01-07 DIAGNOSIS — Z950 Presence of cardiac pacemaker: Secondary | ICD-10-CM

## 2021-01-07 NOTE — Patient Instructions (Signed)
Medication Instructions:  Your physician recommends that you continue on your current medications as directed. Please refer to the Current Medication list given to you today.  *If you need a refill on your cardiac medications before your next appointment, please call your pharmacy*   Lab Work: None If you have labs (blood work) drawn today and your tests are completely normal, you will receive your results only by: Marland Kitchen MyChart Message (if you have MyChart) OR . A paper copy in the mail If you have any lab test that is abnormal or we need to change your treatment, we will call you to review the results.   Follow-Up: At [facility], you and your health needs are our priority.  As part of our continuing mission to provide you with exceptional heart care, we have created designated Provider Care Teams.  These Care Teams include your primary Cardiologist (physician) and Advanced Practice Providers (APPs -  Physician Assistants and Nurse Practitioners) who all work together to provide you with the care you need, when you need it.  We recommend signing up for the patient portal called "MyChart".  Sign up information is provided on this After Visit Summary.  MyChart is used to connect with patients for Virtual Visits (Telemedicine).  Patients are able to view lab/test results, encounter notes, upcoming appointments, etc.  Non-urgent messages can be sent to your provider as well.   To learn more about what you can do with MyChart, go to [email or web address].    Your next appointment:   1 year(s)  The format for your next appointment:   In Person  Provider:   You may see Virl Axe, MD or one of the following Advanced Practice Providers on your designated Care Team:    Chanetta Marshall, NP  Tommye Standard, PA-C  Legrand Como "[place]" Hunt, [place]

## 2021-01-07 NOTE — Progress Notes (Signed)
Electrophysiology Office Note Date: 01/07/2021  ID:  Mikayla Harper, DOB 1930-06-16, MRN [id number]  PCP: Haywood Pao, MD Primary Cardiologist: Virl Axe, MD Electrophysiologist: Virl Axe, MD   CC: Pacemaker follow-up  Mikayla Harper is a 87 y.o. female seen today for Virl Axe, MD for routine electrophysiology followup.  Since last being seen in our clinic the patient reports doing well. She resides in an Alzheimers unit and caregiver is here with her. They do not have a monitor at home, suspect it is still at her sons house.  she denies chest pain, palpitations, dyspnea, PND, orthopnea, nausea, vomiting, dizziness, syncope, edema, weight gain, or early satiety.  Device History: St. Jude Dual Chamber PPM implanted 09/2011 for SSS/Tachy-brady  Past Medical History:  Diagnosis Date  . Anemia   . Carpal tunnel syndrome of left wrist 12/20/2010   left hand  . Dementia ([place])   . Hyperlipidemia    on Mevacor  . Influenza-like illness 2009   H1N1 PCR negative  . Osteopenia   . Pacemaker - [facility]    Implant Jan 2013  . PAF (paroxysmal atrial fibrillation) ([place])    previously on propafenone  . Sick sinus syndrome ([place])   . TIA (transient ischemic attack) Dec 2012  . Tuberculosis     hx of over 50 years ago "  . Urinary tract infection    Past Surgical History:  Procedure Laterality Date  . ABDOMINAL HYSTERECTOMY    . BACK SURGERY  1976  . CARDIOVERSION N/A 11/13/2012   Procedure: CARDIOVERSION;  Surgeon: Peter M Martinique, MD;  Location: [facility] ENDOSCOPY;  Service: Cardiovascular;  Laterality: N/A;  . COLONOSCOPY  08/01/2012   Procedure: COLONOSCOPY;  Surgeon: Milus Banister, MD;  Location: [facility] ENDOSCOPY;  Service: Endoscopy;  Laterality: N/A;  . ESOPHAGOGASTRODUODENOSCOPY  08/01/2012   Procedure: ESOPHAGOGASTRODUODENOSCOPY (EGD);  Surgeon: Milus Banister, MD;  Location: [facility] ENDOSCOPY;  Service: Endoscopy;  Laterality: N/A;  . INSERT / REPLACE / REMOVE PACEMAKER   10/21/2011  . LUMBAR FUSION N/A   . OTHER SURGICAL HISTORY  In the late 1960's   Hysterectomy secondary to fibroids  . OTHER SURGICAL HISTORY     cataract surgery  . PERMANENT PACEMAKER INSERTION N/A 10/21/2011   Procedure: PERMANENT PACEMAKER INSERTION;  Surgeon: Deboraha Sprang, MD;  Location: [facility] CATH LAB;  Service: Cardiovascular;  Laterality: N/A;    Current Outpatient Medications  Medication Sig Dispense Refill  . acetaminophen (TYLENOL) 500 MG tablet Take 500 mg by mouth in the morning and at bedtime.    Marland Kitchen albuterol (PROVENTIL HFA;VENTOLIN HFA) 108 (90 BASE) MCG/ACT inhaler Inhale 2 puffs into the lungs every 6 (six) hours as needed for wheezing.    Marland Kitchen aspirin EC 81 MG EC tablet Take 1 tablet (81 mg total) by mouth daily.    . busPIRone (BUSPAR) 10 MG tablet Take 10 mg by mouth 2 (two) times daily.    . carvedilol (COREG) 3.125 MG tablet TAKE 1 TABLET BY MOUTH TWICE A DAY WITH A MEAL 180 tablet 1  . famotidine (PEPCID) 20 MG tablet Take 20 mg by mouth daily.    Marland Kitchen guaiFENesin (MUCINEX) 600 MG 12 hr tablet Take 600 mg by mouth daily.    Marland Kitchen guaiFENesin-dextromethorphan (ROBITUSSIN DM) 100-10 MG/5ML syrup Take 5 mLs by mouth as needed for cough.    Marland Kitchen ipratropium-albuterol (DUONEB) 0.5-2.5 (3) MG/3ML SOLN Take 3 mLs by nebulization 4 (four) times daily.    . memantine (NAMENDA)  10 MG tablet Take 10 mg by mouth daily.    . Multiple Vitamins-Minerals (WOMENS MULTIVITAMIN PLUS PO) Take 1 tablet by mouth daily.    . nitroGLYCERIN (NITROSTAT) 0.4 MG SL tablet Place 1 tablet (0.4 mg total) under the tongue every 5 (five) minutes x 3 doses as needed for chest pain. 25 tablet 12  . SPIRIVA HANDIHALER 18 MCG inhalation capsule 1 capsule daily.    . Vitamin D, Ergocalciferol, 50 MCG (2000 UT) CAPS Take by mouth.     No current facility-administered medications for this visit.    Allergies:   Lisinopril   Social History: Social History   Socioeconomic History  . Marital status: Married     Spouse name: Not on file  . Number of children: 2  . Years of education: Not on file  . Highest education level: Not on file  Occupational History  . Occupation: retired2    Employer: RETIRED  Tobacco Use  . Smoking status: Former Smoker    Packs/day: 1.00    Years: 30.00    Pack years: 30.00    Types: Cigarettes    Quit date: 10/05/1988    Years since quitting: 32.2  . Smokeless tobacco: Never Used  Vaping Use  . Vaping Use: Never used  Substance and Sexual Activity  . Alcohol use: No  . Drug use: No  . Sexual activity: Never  Other Topics Concern  . Not on file  Social History Narrative   Pt lives alone in 1 story home   Had 3 children / 2 are now deceased   12-11-2022 grade education   Retired.    Social Determinants of Health   Financial Resource Strain: Not on file  Food Insecurity: Not on file  Transportation Needs: Not on file  Physical Activity: Not on file  Stress: Not on file  Social Connections: Not on file  Intimate Partner Violence: Not on file    Family History: Family History  Problem Relation Age of Onset  . Dementia Mother   . Emphysema Father   . Heart disease Father   . Parkinson's disease Sister   . Heart disease Brother   . Cancer Daughter   . COPD Sister      Review of Systems: All other systems reviewed and are otherwise negative except as noted above.  Physical Exam: Vitals:   01/07/21 1044  BP: 96/66  Pulse: 69  SpO2: 97%  Weight: 127 lb 9.6 oz (57.9 kg)  Height: 5\' 6"  (1.676 m)     GEN- The patient is well appearing, alert and oriented x 3 today.   HEENT: normocephalic, atraumatic; sclera clear, conjunctiva pink; hearing intact; oropharynx clear; neck supple  Lungs- Clear to ausculation bilaterally, normal work of breathing.  No wheezes, rales, rhonchi Heart- Regular rate and rhythm, no murmurs, rubs or gallops  GI- soft, non-tender, non-distended, bowel sounds present  Extremities- no clubbing or cyanosis. No edema MS- no  significant deformity or atrophy Skin- warm and dry, no rash or lesion; PPM pocket well healed Psych- euthymic mood, full affect Neuro- strength and sensation are intact  PPM Interrogation- reviewed in detail today,  See PACEART report  EKG:  EKG is ordered today. The ekg ordered today shows AF with controlled ventricular rates at 69 bpm  Recent Labs: No results found for requested labs within last 8760 hours.   Wt Readings from Last 3 Encounters:  01/07/21 127 lb 9.6 oz (57.9 kg)  01/07/20 143 lb (64.9 kg)  02/14/18 143 lb (64.9 kg)     Other studies Reviewed: Additional studies/ records that were reviewed today include: Previous EP office notes, Previous remote checks, Most recent labwork.   Assessment and Plan:  1. Sick sinus syndrome s/p St. Jude PPM  Normal PPM function See Claudia Desanctis Art report No changes today Will reach out to son to try and set back up monitoring. Will require assistance from the facility, as she will not be able to keep in consistently plugged in her room in the Alzheimers unit. Staff says it is highly likely to go missing or be damaged by other patients due to the nature of the facility.   2. Permanent AF Rates controlled.  She is off [place] due to recurrent GI bleeds.  Current medicines are reviewed at length with the patient today.   The patient does not have concerns regarding her medicines.  The following changes were made today:  none  Labs/ tests ordered today include:  Orders Placed This Encounter  Procedures  . EKG 12-Lead     Disposition:   Follow up with Dr. Caryl Comes in 12 Months. We will work to get her remotes going again.     Jacalyn Lefevre, PA-C  01/07/2021 11:20 AM  [address] [place] [address] [phone] (office) [phone] (fax)

## 2021-01-07 NOTE — Telephone Encounter (Signed)
Successful telephone encounter to Manual Meier, son of patient Mikayla Harper (per patient request) to enquire about the location of patients PPM remote monitor. Patient resides in a facility memory unit and has missed sending multiple remote transmissions. Legrand Como will speak with his wife and attempt to locate remote monitor. Discussed with patient's caregiver during todays in-clinic visit importance of sending transmissions as scheduled if monitor is located and provided. Caregiver provided dates of future scheduled transmissions to place in patient's facility chart. If remote monitor is not located patient may be scheduled for more frequent in-clinic checks per AT. Son provided clinic number and encouraged to call if monitor not located.

## 2021-01-11 ENCOUNTER — Telehealth: Payer: Self-pay

## 2021-01-11 NOTE — Telephone Encounter (Signed)
Patient son called in to let us know he cannot find home monitor and wants [place] to order one for the patient and have it sent to [facility] in [place], I have ordered that home monitor and it will arrive 7-10 business days. Patient son is aware for the nurse to call us if they need help setting it up

## 2021-01-13 LAB — CUP PACEART INCLINIC DEVICE CHECK
Battery Remaining Longevity: 33 mo
Battery Voltage: 2.8 V
Brady Statistic RA Percent Paced: 0 %
Brady Statistic RV Percent Paced: 11 %
Date Time Interrogation Session: 20220414123412
Implantable Lead Implant Date: 20130125
Implantable Lead Implant Date: 20130125
Implantable Lead Location: 753859
Implantable Lead Location: 753860
Implantable Pulse Generator Implant Date: 20130125
Lead Channel Impedance Value: 425 Ohm
Lead Channel Pacing Threshold Amplitude: 1.25 V
Lead Channel Pacing Threshold Pulse Width: 1 ms
Lead Channel Sensing Intrinsic Amplitude: 6 mV
Lead Channel Setting Pacing Amplitude: 2.5 V
Lead Channel Setting Pacing Pulse Width: 1 ms
Lead Channel Setting Sensing Sensitivity: 2 mV
Pulse Gen Model: 2110
Pulse Gen Serial Number: 7300824

## 2021-01-15 NOTE — Telephone Encounter (Signed)
Successful telephone encounter to follow up on remote transmitter availability. Overlooked previous documentation stating son called back and remote transmitter could not be located. Per EMR note 01/11/21, home monitor has been ordered and will be sent sent to [facility] in [place]. Son appreciative of follow up. If remote transmissions are not successful secondary to patient living in an Alzheimer unit, patient may need to be seen in clinic more often per protocol.

## 2021-02-18 ENCOUNTER — Other Ambulatory Visit (HOSPITAL_COMMUNITY): Payer: Self-pay | Admitting: Internal Medicine

## 2021-02-18 ENCOUNTER — Other Ambulatory Visit: Payer: Self-pay | Admitting: Internal Medicine

## 2021-02-18 DIAGNOSIS — R1903 Right lower quadrant abdominal swelling, mass and lump: Secondary | ICD-10-CM

## 2021-02-20 ENCOUNTER — Inpatient Hospital Stay (HOSPITAL_COMMUNITY)
Admission: EM | Admit: 2021-02-20 | Discharge: 2021-03-26 | DRG: 853 | Disposition: E | Payer: Medicare Other | Attending: Internal Medicine | Admitting: Internal Medicine

## 2021-02-20 ENCOUNTER — Observation Stay (HOSPITAL_COMMUNITY): Payer: Medicare Other | Admitting: Anesthesiology

## 2021-02-20 ENCOUNTER — Encounter (HOSPITAL_COMMUNITY): Admission: EM | Disposition: E | Payer: Self-pay | Source: Home / Self Care | Attending: Internal Medicine

## 2021-02-20 ENCOUNTER — Other Ambulatory Visit: Payer: Self-pay

## 2021-02-20 ENCOUNTER — Emergency Department (HOSPITAL_COMMUNITY): Payer: Medicare Other

## 2021-02-20 DIAGNOSIS — I130 Hypertensive heart and chronic kidney disease with heart failure and stage 1 through stage 4 chronic kidney disease, or unspecified chronic kidney disease: Secondary | ICD-10-CM | POA: Diagnosis present

## 2021-02-20 DIAGNOSIS — N179 Acute kidney failure, unspecified: Secondary | ICD-10-CM | POA: Diagnosis present

## 2021-02-20 DIAGNOSIS — Z66 Do not resuscitate: Secondary | ICD-10-CM | POA: Diagnosis present

## 2021-02-20 DIAGNOSIS — Z950 Presence of cardiac pacemaker: Secondary | ICD-10-CM

## 2021-02-20 DIAGNOSIS — I495 Sick sinus syndrome: Secondary | ICD-10-CM | POA: Diagnosis present

## 2021-02-20 DIAGNOSIS — K658 Other peritonitis: Secondary | ICD-10-CM | POA: Diagnosis present

## 2021-02-20 DIAGNOSIS — R6521 Severe sepsis with septic shock: Secondary | ICD-10-CM | POA: Diagnosis present

## 2021-02-20 DIAGNOSIS — I700 Atherosclerosis of aorta: Secondary | ICD-10-CM | POA: Diagnosis present

## 2021-02-20 DIAGNOSIS — K56609 Unspecified intestinal obstruction, unspecified as to partial versus complete obstruction: Secondary | ICD-10-CM | POA: Diagnosis present

## 2021-02-20 DIAGNOSIS — Z20822 Contact with and (suspected) exposure to covid-19: Secondary | ICD-10-CM | POA: Diagnosis present

## 2021-02-20 DIAGNOSIS — E785 Hyperlipidemia, unspecified: Secondary | ICD-10-CM | POA: Diagnosis present

## 2021-02-20 DIAGNOSIS — Z7982 Long term (current) use of aspirin: Secondary | ICD-10-CM

## 2021-02-20 DIAGNOSIS — N289 Disorder of kidney and ureter, unspecified: Secondary | ICD-10-CM

## 2021-02-20 DIAGNOSIS — I10 Essential (primary) hypertension: Secondary | ICD-10-CM | POA: Diagnosis present

## 2021-02-20 DIAGNOSIS — E872 Acidosis, unspecified: Secondary | ICD-10-CM | POA: Diagnosis present

## 2021-02-20 DIAGNOSIS — Z8673 Personal history of transient ischemic attack (TIA), and cerebral infarction without residual deficits: Secondary | ICD-10-CM

## 2021-02-20 DIAGNOSIS — E861 Hypovolemia: Secondary | ICD-10-CM | POA: Diagnosis present

## 2021-02-20 DIAGNOSIS — D6959 Other secondary thrombocytopenia: Secondary | ICD-10-CM | POA: Diagnosis present

## 2021-02-20 DIAGNOSIS — Z8611 Personal history of tuberculosis: Secondary | ICD-10-CM

## 2021-02-20 DIAGNOSIS — E869 Volume depletion, unspecified: Secondary | ICD-10-CM | POA: Diagnosis present

## 2021-02-20 DIAGNOSIS — K55029 Acute infarction of small intestine, extent unspecified: Secondary | ICD-10-CM | POA: Diagnosis present

## 2021-02-20 DIAGNOSIS — K631 Perforation of intestine (nontraumatic): Secondary | ICD-10-CM | POA: Diagnosis present

## 2021-02-20 DIAGNOSIS — A419 Sepsis, unspecified organism: Secondary | ICD-10-CM | POA: Diagnosis not present

## 2021-02-20 DIAGNOSIS — E860 Dehydration: Secondary | ICD-10-CM | POA: Diagnosis present

## 2021-02-20 DIAGNOSIS — I252 Old myocardial infarction: Secondary | ICD-10-CM

## 2021-02-20 DIAGNOSIS — Z87891 Personal history of nicotine dependence: Secondary | ICD-10-CM

## 2021-02-20 DIAGNOSIS — I5042 Chronic combined systolic (congestive) and diastolic (congestive) heart failure: Secondary | ICD-10-CM | POA: Diagnosis present

## 2021-02-20 DIAGNOSIS — D751 Secondary polycythemia: Secondary | ICD-10-CM | POA: Diagnosis present

## 2021-02-20 DIAGNOSIS — N1831 Chronic kidney disease, stage 3a: Secondary | ICD-10-CM | POA: Diagnosis present

## 2021-02-20 DIAGNOSIS — D696 Thrombocytopenia, unspecified: Secondary | ICD-10-CM | POA: Diagnosis present

## 2021-02-20 DIAGNOSIS — I4821 Permanent atrial fibrillation: Secondary | ICD-10-CM | POA: Diagnosis present

## 2021-02-20 DIAGNOSIS — K4040 Unilateral inguinal hernia, with gangrene, not specified as recurrent: Secondary | ICD-10-CM | POA: Diagnosis present

## 2021-02-20 DIAGNOSIS — D72829 Elevated white blood cell count, unspecified: Secondary | ICD-10-CM | POA: Diagnosis present

## 2021-02-20 DIAGNOSIS — I2510 Atherosclerotic heart disease of native coronary artery without angina pectoris: Secondary | ICD-10-CM | POA: Diagnosis present

## 2021-02-20 DIAGNOSIS — Z515 Encounter for palliative care: Secondary | ICD-10-CM

## 2021-02-20 DIAGNOSIS — J449 Chronic obstructive pulmonary disease, unspecified: Secondary | ICD-10-CM | POA: Diagnosis present

## 2021-02-20 DIAGNOSIS — K4030 Unilateral inguinal hernia, with obstruction, without gangrene, not specified as recurrent: Secondary | ICD-10-CM

## 2021-02-20 DIAGNOSIS — E871 Hypo-osmolality and hyponatremia: Secondary | ICD-10-CM | POA: Diagnosis present

## 2021-02-20 DIAGNOSIS — J439 Emphysema, unspecified: Secondary | ICD-10-CM | POA: Diagnosis present

## 2021-02-20 DIAGNOSIS — I4891 Unspecified atrial fibrillation: Secondary | ICD-10-CM | POA: Diagnosis present

## 2021-02-20 DIAGNOSIS — Z452 Encounter for adjustment and management of vascular access device: Secondary | ICD-10-CM

## 2021-02-20 DIAGNOSIS — Z90710 Acquired absence of both cervix and uterus: Secondary | ICD-10-CM

## 2021-02-20 DIAGNOSIS — Z79899 Other long term (current) drug therapy: Secondary | ICD-10-CM

## 2021-02-20 DIAGNOSIS — F0390 Unspecified dementia without behavioral disturbance: Secondary | ICD-10-CM | POA: Diagnosis present

## 2021-02-20 HISTORY — PX: INGUINAL HERNIA REPAIR: SHX194

## 2021-02-20 LAB — RESP PANEL BY RT-PCR (FLU A&B, COVID) ARPGX2
Influenza A by PCR: NEGATIVE
Influenza B by PCR: NEGATIVE
SARS Coronavirus 2 by RT PCR: NEGATIVE

## 2021-02-20 LAB — CBC WITH DIFFERENTIAL/PLATELET
Abs Immature Granulocytes: 0.11 10*3/uL — ABNORMAL HIGH (ref 0.00–0.07)
Basophils Absolute: 0 10*3/uL (ref 0.0–0.1)
Basophils Relative: 0 %
Eosinophils Absolute: 0 10*3/uL (ref 0.0–0.5)
Eosinophils Relative: 0 %
HCT: 49.7 % — ABNORMAL HIGH (ref 36.0–46.0)
Hemoglobin: 16.7 g/dL — ABNORMAL HIGH (ref 12.0–15.0)
Immature Granulocytes: 1 %
Lymphocytes Relative: 12 %
Lymphs Abs: 1.8 10*3/uL (ref 0.7–4.0)
MCH: 31.7 pg (ref 26.0–34.0)
MCHC: 33.6 g/dL (ref 30.0–36.0)
MCV: 94.3 fL (ref 80.0–100.0)
Monocytes Absolute: 1.5 10*3/uL — ABNORMAL HIGH (ref 0.1–1.0)
Monocytes Relative: 10 %
Neutro Abs: 12 10*3/uL — ABNORMAL HIGH (ref 1.7–7.7)
Neutrophils Relative %: 77 %
Platelets: 416 10*3/uL — ABNORMAL HIGH (ref 150–400)
RBC: 5.27 MIL/uL — ABNORMAL HIGH (ref 3.87–5.11)
RDW: 12.8 % (ref 11.5–15.5)
WBC: 15.4 10*3/uL — ABNORMAL HIGH (ref 4.0–10.5)
nRBC: 0 % (ref 0.0–0.2)

## 2021-02-20 LAB — COMPREHENSIVE METABOLIC PANEL
ALT: 21 U/L (ref 0–44)
AST: 38 U/L (ref 15–41)
Albumin: 3.7 g/dL (ref 3.5–5.0)
Alkaline Phosphatase: 82 U/L (ref 38–126)
Anion gap: 18 — ABNORMAL HIGH (ref 5–15)
BUN: 97 mg/dL — ABNORMAL HIGH (ref 8–23)
CO2: 26 mmol/L (ref 22–32)
Calcium: 10.3 mg/dL (ref 8.9–10.3)
Chloride: 85 mmol/L — ABNORMAL LOW (ref 98–111)
Creatinine, Ser: 2.82 mg/dL — ABNORMAL HIGH (ref 0.44–1.00)
GFR, Estimated: 16 mL/min — ABNORMAL LOW (ref >60)
Glucose, Bld: 102 mg/dL — ABNORMAL HIGH (ref 70–99)
Potassium: 4.6 mmol/L (ref 3.5–5.1)
Sodium: 129 mmol/L — ABNORMAL LOW (ref 135–145)
Total Bilirubin: 1.2 mg/dL (ref 0.3–1.2)
Total Protein: 7.8 g/dL (ref 6.5–8.1)

## 2021-02-20 LAB — LIPASE, BLOOD: Lipase: 70 U/L — ABNORMAL HIGH (ref 11–51)

## 2021-02-20 LAB — POCT I-STAT 7, (LYTES, BLD GAS, ICA,H+H)
Acid-base deficit: 1 mmol/L (ref 0.0–2.0)
Bicarbonate: 22.7 mmol/L (ref 20.0–28.0)
Calcium, Ion: 1.14 mmol/L — ABNORMAL LOW (ref 1.15–1.40)
HCT: 44 % (ref 36.0–46.0)
Hemoglobin: 15 g/dL (ref 12.0–15.0)
O2 Saturation: 90 %
Patient temperature: 36
Potassium: 3.9 mmol/L (ref 3.5–5.1)
Sodium: 129 mmol/L — ABNORMAL LOW (ref 135–145)
TCO2: 24 mmol/L (ref 22–32)
pCO2 arterial: 33.7 mmHg (ref 32.0–48.0)
pH, Arterial: 7.432 (ref 7.350–7.450)
pO2, Arterial: 54 mmHg — ABNORMAL LOW (ref 83.0–108.0)

## 2021-02-20 LAB — LACTIC ACID, PLASMA: Lactic Acid, Venous: 2 mmol/L (ref 0.5–1.9)

## 2021-02-20 SURGERY — REPAIR, HERNIA, INGUINAL, INCARCERATED
Anesthesia: General | Site: Abdomen | Laterality: Right

## 2021-02-20 MED ORDER — FENTANYL CITRATE (PF) 100 MCG/2ML IJ SOLN
INTRAMUSCULAR | Status: DC | PRN
  Administered 2021-02-20 (×2): 50 ug via INTRAVENOUS

## 2021-02-20 MED ORDER — ROCURONIUM BROMIDE 10 MG/ML (PF) SYRINGE
PREFILLED_SYRINGE | INTRAVENOUS | Status: AC
  Filled 2021-02-20: qty 20

## 2021-02-20 MED ORDER — VASOPRESSIN 20 UNIT/ML IV SOLN
INTRAVENOUS | Status: DC | PRN
  Administered 2021-02-20: 2 [IU] via INTRAVENOUS

## 2021-02-20 MED ORDER — ESMOLOL HCL 100 MG/10ML IV SOLN
INTRAVENOUS | Status: DC | PRN
  Administered 2021-02-20 (×2): 20 mg via INTRAVENOUS

## 2021-02-20 MED ORDER — ONDANSETRON HCL 4 MG/2ML IJ SOLN
4.0000 mg | Freq: Once | INTRAMUSCULAR | Status: AC
  Administered 2021-02-20: 4 mg via INTRAVENOUS
  Filled 2021-02-20: qty 2

## 2021-02-20 MED ORDER — LABETALOL HCL 5 MG/ML IV SOLN
INTRAVENOUS | Status: AC
  Filled 2021-02-20: qty 4

## 2021-02-20 MED ORDER — VASOPRESSIN 20 UNIT/ML IV SOLN
INTRAVENOUS | Status: AC
  Filled 2021-02-20: qty 1

## 2021-02-20 MED ORDER — ESMOLOL HCL 100 MG/10ML IV SOLN
INTRAVENOUS | Status: AC
  Filled 2021-02-20: qty 10

## 2021-02-20 MED ORDER — SUCCINYLCHOLINE CHLORIDE 200 MG/10ML IV SOSY
PREFILLED_SYRINGE | INTRAVENOUS | Status: DC | PRN
  Administered 2021-02-20: 80 mg via INTRAVENOUS

## 2021-02-20 MED ORDER — BUPIVACAINE-EPINEPHRINE (PF) 0.25% -1:200000 IJ SOLN
INTRAMUSCULAR | Status: AC
  Filled 2021-02-20: qty 30

## 2021-02-20 MED ORDER — LIDOCAINE 2% (20 MG/ML) 5 ML SYRINGE
INTRAMUSCULAR | Status: AC
  Filled 2021-02-20: qty 10

## 2021-02-20 MED ORDER — PHENYLEPHRINE 40 MCG/ML (10ML) SYRINGE FOR IV PUSH (FOR BLOOD PRESSURE SUPPORT)
PREFILLED_SYRINGE | INTRAVENOUS | Status: AC
  Filled 2021-02-20: qty 20

## 2021-02-20 MED ORDER — LIDOCAINE 2% (20 MG/ML) 5 ML SYRINGE
INTRAMUSCULAR | Status: DC | PRN
  Administered 2021-02-20: 60 mg via INTRAVENOUS

## 2021-02-20 MED ORDER — FENTANYL CITRATE (PF) 100 MCG/2ML IJ SOLN
50.0000 ug | Freq: Once | INTRAMUSCULAR | Status: AC
  Administered 2021-02-20: 50 ug via INTRAVENOUS
  Filled 2021-02-20: qty 2

## 2021-02-20 MED ORDER — ALBUMIN HUMAN 5 % IV SOLN
INTRAVENOUS | Status: AC
  Administered 2021-02-21: 12.5 g
  Filled 2021-02-20: qty 250

## 2021-02-20 MED ORDER — CEFAZOLIN SODIUM 1 G IJ SOLR
INTRAMUSCULAR | Status: AC
  Filled 2021-02-20: qty 20

## 2021-02-20 MED ORDER — CEFAZOLIN SODIUM-DEXTROSE 2-3 GM-%(50ML) IV SOLR
INTRAVENOUS | Status: DC | PRN
  Administered 2021-02-20: 2 g via INTRAVENOUS

## 2021-02-20 MED ORDER — SODIUM CHLORIDE 0.9 % IV BOLUS: 500.0000 mL | Freq: Once | INTRAVENOUS | Status: DC

## 2021-02-20 MED ORDER — ONDANSETRON HCL 4 MG/2ML IJ SOLN
INTRAMUSCULAR | Status: DC | PRN
  Administered 2021-02-20: 4 mg via INTRAVENOUS

## 2021-02-20 MED ORDER — SUCCINYLCHOLINE CHLORIDE 200 MG/10ML IV SOSY
PREFILLED_SYRINGE | INTRAVENOUS | Status: AC
  Filled 2021-02-20: qty 10

## 2021-02-20 MED ORDER — PROPOFOL 10 MG/ML IV BOLUS
INTRAVENOUS | Status: DC | PRN
  Administered 2021-02-20: 60 mg via INTRAVENOUS

## 2021-02-20 MED ORDER — ONDANSETRON HCL 4 MG/2ML IJ SOLN
INTRAMUSCULAR | Status: AC
  Filled 2021-02-20: qty 4

## 2021-02-20 MED ORDER — 0.9 % SODIUM CHLORIDE (POUR BTL) OPTIME
TOPICAL | Status: DC | PRN
  Administered 2021-02-20: 2000 mL

## 2021-02-20 MED ORDER — LACTATED RINGERS IV SOLN: INTRAVENOUS | Status: DC | PRN

## 2021-02-20 MED ORDER — METOPROLOL TARTRATE 5 MG/5ML IV SOLN: 5.0000 mg | Freq: Once | INTRAVENOUS | Status: DC

## 2021-02-20 MED ORDER — ACETAMINOPHEN 10 MG/ML IV SOLN
INTRAVENOUS | Status: DC | PRN
  Administered 2021-02-20: 1000 mg via INTRAVENOUS

## 2021-02-20 MED ORDER — SODIUM CHLORIDE 0.9 % IV BOLUS
500.0000 mL | Freq: Once | INTRAVENOUS | Status: AC
  Administered 2021-02-20: 500 mL via INTRAVENOUS

## 2021-02-20 MED ORDER — ARTIFICIAL TEARS OPHTHALMIC OINT
TOPICAL_OINTMENT | OPHTHALMIC | Status: AC
  Filled 2021-02-20: qty 7

## 2021-02-20 MED ORDER — EPHEDRINE 5 MG/ML INJ
INTRAVENOUS | Status: AC
  Filled 2021-02-20: qty 20

## 2021-02-20 MED ORDER — SUGAMMADEX SODIUM 200 MG/2ML IV SOLN
INTRAVENOUS | Status: DC | PRN
  Administered 2021-02-20: 200 mg via INTRAVENOUS

## 2021-02-20 MED ORDER — LACTATED RINGERS IV BOLUS: 1000.0000 mL | Freq: Once | INTRAVENOUS | Status: DC

## 2021-02-20 MED ORDER — PHENYLEPHRINE HCL-NACL 10-0.9 MG/250ML-% IV SOLN
INTRAVENOUS | Status: DC | PRN
  Administered 2021-02-20: 120 ug/min via INTRAVENOUS

## 2021-02-20 MED ORDER — FENTANYL CITRATE (PF) 250 MCG/5ML IJ SOLN
INTRAMUSCULAR | Status: AC
  Filled 2021-02-20: qty 5

## 2021-02-20 MED ORDER — ROCURONIUM BROMIDE 10 MG/ML (PF) SYRINGE
PREFILLED_SYRINGE | INTRAVENOUS | Status: DC | PRN
  Administered 2021-02-20: 50 mg via INTRAVENOUS

## 2021-02-20 MED ORDER — CEFAZOLIN SODIUM-DEXTROSE 1-4 GM/50ML-% IV SOLN
1.0000 g | Freq: Two times a day (BID) | INTRAVENOUS | Status: DC
  Filled 2021-02-20: qty 50

## 2021-02-20 MED ORDER — ACETAMINOPHEN 10 MG/ML IV SOLN
INTRAVENOUS | Status: AC
  Filled 2021-02-20: qty 100

## 2021-02-20 MED ORDER — ALBUMIN HUMAN 5 % IV SOLN: INTRAVENOUS | Status: DC | PRN

## 2021-02-20 MED ORDER — FENTANYL CITRATE (PF) 100 MCG/2ML IJ SOLN
25.0000 ug | Freq: Once | INTRAMUSCULAR | Status: AC
  Administered 2021-02-20: 25 ug via INTRAVENOUS
  Filled 2021-02-20: qty 2

## 2021-02-20 MED ORDER — PIPERACILLIN-TAZOBACTAM 3.375 G IVPB 30 MIN
3.3750 g | Freq: Once | INTRAVENOUS | Status: DC
  Filled 2021-02-20: qty 50

## 2021-02-20 MED ORDER — PROPOFOL 10 MG/ML IV BOLUS
INTRAVENOUS | Status: AC
  Filled 2021-02-20: qty 20

## 2021-02-20 SURGICAL SUPPLY — 41 items
BAG URINE DRAIN 2000ML AR STRL (UROLOGICAL SUPPLIES) ×2 IMPLANT
BLADE CLIPPER SURG (BLADE) IMPLANT
CANISTER SUCT 3000ML PPV (MISCELLANEOUS) ×4 IMPLANT
CHLORAPREP W/TINT 26 (MISCELLANEOUS) ×3 IMPLANT
COVER SURGICAL LIGHT HANDLE (MISCELLANEOUS) ×3 IMPLANT
DERMABOND ADVANCED (GAUZE/BANDAGES/DRESSINGS) ×2
DERMABOND ADVANCED .7 DNX12 (GAUZE/BANDAGES/DRESSINGS) ×1 IMPLANT
DRAPE LAPAROTOMY TRNSV 102X78 (DRAPES) ×3 IMPLANT
DRSG TELFA 3X8 NADH (GAUZE/BANDAGES/DRESSINGS) ×6 IMPLANT
ELECT REM PT RETURN 9FT ADLT (ELECTROSURGICAL) ×3
ELECTRODE REM PT RTRN 9FT ADLT (ELECTROSURGICAL) ×1 IMPLANT
GAUZE SPONGE 4X4 12PLY STRL (GAUZE/BANDAGES/DRESSINGS) ×2 IMPLANT
GLOVE BIO SURGEON STRL SZ 6 (GLOVE) ×5 IMPLANT
GLOVE SURG UNDER LTX SZ6.5 (GLOVE) ×3 IMPLANT
GOWN STRL REUS W/ TWL LRG LVL3 (GOWN DISPOSABLE) ×2 IMPLANT
GOWN STRL REUS W/TWL LRG LVL3 (GOWN DISPOSABLE) ×4
HANDLE SUCTION POOLE (INSTRUMENTS) IMPLANT
KIT BASIN OR (CUSTOM PROCEDURE TRAY) ×3 IMPLANT
KIT TURNOVER KIT B (KITS) ×3 IMPLANT
LIGASURE VESSEL 5MM BLUNT TIP (ELECTROSURGICAL) ×2 IMPLANT
NS IRRIG 1000ML POUR BTL (IV SOLUTION) ×3 IMPLANT
PACK GENERAL/GYN (CUSTOM PROCEDURE TRAY) ×3 IMPLANT
PAD ARMBOARD 7.5X6 YLW CONV (MISCELLANEOUS) ×3 IMPLANT
PAD DRESSING TELFA 3X8 NADH (GAUZE/BANDAGES/DRESSINGS) IMPLANT
PENCIL SMOKE EVACUATOR (MISCELLANEOUS) ×3 IMPLANT
RELOAD PROXIMATE 75MM BLUE (ENDOMECHANICALS) ×18 IMPLANT
RELOAD STAPLE 75 3.8 BLU REG (ENDOMECHANICALS) IMPLANT
SPONGE LAP 18X18 RF (DISPOSABLE) ×3 IMPLANT
STAPLER PROXIMATE 75MM BLUE (STAPLE) ×2 IMPLANT
STAPLER VISISTAT 35W (STAPLE) ×2 IMPLANT
SUCTION POOLE HANDLE (INSTRUMENTS) ×6
SUT MNCRL AB 4-0 PS2 18 (SUTURE) ×2 IMPLANT
SUT PDS AB 1 TP1 96 (SUTURE) ×4 IMPLANT
SUT SILK 2 0 SH CR/8 (SUTURE) ×2 IMPLANT
SUT SILK 3 0 SH CR/8 (SUTURE) ×2 IMPLANT
SUT VIC AB 3-0 SH 18 (SUTURE) ×2 IMPLANT
SYR CONTROL 10ML LL (SYRINGE) ×3 IMPLANT
TAPE CLOTH SURG 6X10 WHT LF (GAUZE/BANDAGES/DRESSINGS) ×2 IMPLANT
TOWEL GREEN STERILE (TOWEL DISPOSABLE) ×3 IMPLANT
TOWEL GREEN STERILE FF (TOWEL DISPOSABLE) ×3 IMPLANT
TRAY FOLEY W/BAG SLVR 14FR (SET/KITS/TRAYS/PACK) ×2 IMPLANT

## 2021-02-20 NOTE — H&P (Signed)
Surgical Evaluation Requesting provider: Martinique Robinson PA-C  Chief Complaint: emesis  HPI: 87 year old woman with a history of permanent atrial fibrillation, not on anticoagulation, sick sinus syndrome with a pacemaker in place, dementia, history of TIA, coronary artery disease, hypertension, COPD, CHF who was brought to the emergency room from her nursing facility at [facility].  She has not had a bowel movement in about 4 days and began having emesis yesterday evening which was treated with Zofran.  An enema was attempted with no relief.  She complains of right-sided lower abdominal pain.  Allergies  Allergen Reactions  . Lisinopril Other (See Comments)    Dizzy and lightheadedness    Past Medical History:  Diagnosis Date  . Anemia   . Carpal tunnel syndrome of left wrist 12/20/2010   left hand  . Dementia ([place])   . Hyperlipidemia    on Mevacor  . Influenza-like illness 2009   H1N1 PCR negative  . Osteopenia   . Pacemaker - [facility]    Implant Jan 2013  . PAF (paroxysmal atrial fibrillation) ([place])    previously on propafenone  . Sick sinus syndrome ([place])   . TIA (transient ischemic attack) Dec 2012  . Tuberculosis     hx of over 50 years ago "  . Urinary tract infection     Past Surgical History:  Procedure Laterality Date  . ABDOMINAL HYSTERECTOMY    . BACK SURGERY  1976  . CARDIOVERSION N/A 11/13/2012   Procedure: CARDIOVERSION;  Surgeon: Peter M Martinique, MD;  Location: [facility] ENDOSCOPY;  Service: Cardiovascular;  Laterality: N/A;  . COLONOSCOPY  08/01/2012   Procedure: COLONOSCOPY;  Surgeon: Milus Banister, MD;  Location: [facility] ENDOSCOPY;  Service: Endoscopy;  Laterality: N/A;  . ESOPHAGOGASTRODUODENOSCOPY  08/01/2012   Procedure: ESOPHAGOGASTRODUODENOSCOPY (EGD);  Surgeon: Milus Banister, MD;  Location: [facility] ENDOSCOPY;  Service: Endoscopy;  Laterality: N/A;  . INSERT / REPLACE / REMOVE PACEMAKER  10/21/2011  . LUMBAR FUSION N/A   . OTHER SURGICAL HISTORY  In the late  1960's   Hysterectomy secondary to fibroids  . OTHER SURGICAL HISTORY     cataract surgery  . PERMANENT PACEMAKER INSERTION N/A 10/21/2011   Procedure: PERMANENT PACEMAKER INSERTION;  Surgeon: Deboraha Sprang, MD;  Location: [facility] CATH LAB;  Service: Cardiovascular;  Laterality: N/A;    Family History  Problem Relation Age of Onset  . Dementia Mother   . Emphysema Father   . Heart disease Father   . Parkinson's disease Sister   . Heart disease Brother   . Cancer Daughter   . COPD Sister     Social History   Socioeconomic History  . Marital status: Married    Spouse name: Not on file  . Number of children: 2  . Years of education: Not on file  . Highest education level: Not on file  Occupational History  . Occupation: retired2    Employer: RETIRED  Tobacco Use  . Smoking status: Former Smoker    Packs/day: 1.00    Years: 30.00    Pack years: 30.00    Types: Cigarettes    Quit date: 10/05/1988    Years since quitting: 32.4  . Smokeless tobacco: Never Used  Vaping Use  . Vaping Use: Never used  Substance and Sexual Activity  . Alcohol use: No  . Drug use: No  . Sexual activity: Never  Other Topics Concern  . Not on file  Social History Narrative   Pt lives alone in  1 story home   Had 3 children / 2 are now deceased   30-Dec-2022 grade education   Retired.    Social Determinants of Health   Financial Resource Strain: Not on file  Food Insecurity: Not on file  Transportation Needs: Not on file  Physical Activity: Not on file  Stress: Not on file  Social Connections: Not on file    No current facility-administered medications on file prior to encounter.   Current Outpatient Medications on File Prior to Encounter  Medication Sig Dispense Refill  . aspirin EC 81 MG EC tablet Take 1 tablet (81 mg total) by mouth daily.    . busPIRone (BUSPAR) 5 MG tablet Take 5 mg by mouth daily in the afternoon.    . cefTRIAXone (ROCEPHIN) 1 g injection Inject 1 g into the muscle  daily.    Marland Kitchen acetaminophen (TYLENOL) 500 MG tablet Take 500 mg by mouth in the morning and at bedtime.    Marland Kitchen albuterol (PROVENTIL HFA;VENTOLIN HFA) 108 (90 BASE) MCG/ACT inhaler Inhale 2 puffs into the lungs every 6 (six) hours as needed for wheezing.    . busPIRone (BUSPAR) 10 MG tablet Take 10 mg by mouth 2 (two) times daily.    . carvedilol (COREG) 3.125 MG tablet TAKE 1 TABLET BY MOUTH TWICE A DAY WITH A MEAL 180 tablet 1  . famotidine (PEPCID) 20 MG tablet Take 20 mg by mouth daily.    Marland Kitchen guaiFENesin (MUCINEX) 600 MG 12 hr tablet Take 600 mg by mouth daily.    Marland Kitchen guaiFENesin-dextromethorphan (ROBITUSSIN DM) 100-10 MG/5ML syrup Take 5 mLs by mouth as needed for cough.    Marland Kitchen ipratropium-albuterol (DUONEB) 0.5-2.5 (3) MG/3ML SOLN Take 3 mLs by nebulization 4 (four) times daily.    . memantine (NAMENDA) 10 MG tablet Take 10 mg by mouth daily.    . Multiple Vitamins-Minerals (WOMENS MULTIVITAMIN PLUS PO) Take 1 tablet by mouth daily.    . nitroGLYCERIN (NITROSTAT) 0.4 MG SL tablet Place 1 tablet (0.4 mg total) under the tongue every 5 (five) minutes x 3 doses as needed for chest pain. 25 tablet 12  . SPIRIVA HANDIHALER 18 MCG inhalation capsule 1 capsule daily.    . Vitamin D, Ergocalciferol, 50 MCG (2000 UT) CAPS Take by mouth.      Review of Systems: a complete, 10pt review of systems was completed with pertinent positives and negatives as documented in the HPI  Physical Exam: Vitals:   01/25/2021 1815 02/21/2021 1915  BP: (!) 123/99 109/72  Pulse: (!) 54 81  Resp: 19 19  Temp:    SpO2: 95% 93%   Gen: Alert, cooperative, answers some questions appropriately and is oriented to self Eyes: lids and conjunctivae normal, no icterus. Pupils equally round and reactive to light.  Neck: supple without mass or thyromegaly Chest: respiratory effort is normal. No crepitus or tenderness on palpation of the chest. Breath sounds equal.  Cardiovascular: RRR with palpable distal pulses, no pedal  edema Gastrointestinal: soft, mildly distended, focally tender in the right groin where there is an incarcerated right inguinal hernia.  I attempted with continuous gentle manual pressure to reduce this but was unsuccessful. Lymphatic: no lymphadenopathy in the neck or groin Muscoloskeletal: no clubbing or cyanosis of the fingers.  Strength is symmetrical throughout.  Range of motion of bilateral upper and lower extremities normal without pain, crepitation or contracture. Neuro: cranial nerves grossly intact.  Sensation intact to light touch diffusely. Psych: appropriate mood and affect, limited insight  Skin: warm and dry   CBC Latest Ref Rng & Units 02/02/2021 02/03/2018 02/02/2018  WBC 4.0 - 10.5 K/uL 15.4(H) 5.1 6.5  Hemoglobin 12.0 - 15.0 g/dL 16.7(H) 8.2(L) 8.4(L)  Hematocrit 36.0 - 46.0 % 49.7(H) 27.7(L) 28.8(L)  Platelets 150 - 400 K/uL 416(H) 231 223    CMP Latest Ref Rng & Units 02/19/2021 02/04/2018 02/03/2018  Glucose 70 - 99 mg/dL 102(H) 89 82  BUN 8 - 23 mg/dL 97(H) 19 21(H)  Creatinine 0.44 - 1.00 mg/dL 2.82(H) 1.19(H) 1.02(H)  Sodium 135 - 145 mmol/L 129(L) 130(L) 132(L)  Potassium 3.5 - 5.1 mmol/L 4.6 4.4 4.4  Chloride 98 - 111 mmol/L 85(L) 98(L) 98(L)  CO2 22 - 32 mmol/L 26 27 26   Calcium 8.9 - 10.3 mg/dL 10.3 8.8(L) 8.8(L)  Total Protein 6.5 - 8.1 g/dL 7.8 - -  Total Bilirubin 0.3 - 1.2 mg/dL 1.2 - -  Alkaline Phos 38 - 126 U/L 82 - -  AST 15 - 41 U/L 38 - -  ALT 0 - 44 U/L 21 - -    Lab Results  Component Value Date   INR 0.9 10/20/2011    Imaging: CT Abdomen Pelvis Wo Contrast  Result Date: 02/18/2021 CLINICAL DATA:  87 year old female with abdominal pain. Concern for bowel obstruction. EXAM: CT ABDOMEN AND PELVIS WITHOUT CONTRAST TECHNIQUE: Multidetector CT imaging of the abdomen and pelvis was performed following the standard protocol without IV contrast. COMPARISON:  None. FINDINGS: Evaluation of this exam is limited in the absence of intravenous contrast.  Lower chest: Right lung base linear and ground-glass, likely atelectasis/scarring. Faint scattered nodular density at the left lung base may be sequela of prior inflammatory/infectious process or represent developing pneumonia. Clinical correlation and follow-up with chest CT on a nonemergent/outpatient basis recommended. Partially visualized pericardial effusion measuring 8 mm in thickness. There is coronary vascular calcification and cardiac pacemaker wire. No intra-abdominal free air or free fluid. Hepatobiliary: The liver is unremarkable. No intrahepatic biliary dilatation. The gallbladder is unremarkable. Pancreas: Unremarkable. No pancreatic ductal dilatation or surrounding inflammatory changes. Spleen: Normal in size without focal abnormality. Adrenals/Urinary Tract: The adrenal glands unremarkable. Moderate bilateral renal parenchyma atrophy. There is no hydronephrosis or nephrolithiasis on either side. The visualized ureters and urinary bladder appear unremarkable. Stomach/Bowel: There is sigmoid diverticulosis without active inflammatory changes. There is herniation of a segment of distal small bowel lateral to the right inguinal canal. There is inflammatory changes of the herniated bowel with severe narrowing of the bowel segment in the neck of the hernia. There is diffuse dilatation of the small bowel loops proximal to the hernia. The small bowel loops measure up to 3.5 cm in caliber. No evidence of acute appendicitis. Vascular/Lymphatic: Advanced aortoiliac atherosclerotic disease. The IVC is unremarkable. No portal venous gas. There is no adenopathy. Reproductive: Hysterectomy. Other: None Musculoskeletal: Osteopenia with scoliosis and degenerative changes. No acute osseous pathology. IMPRESSION: 1. Small-bowel obstruction secondary to herniation of a segment of distal small bowel lateral to the right inguinal canal. 2. Sigmoid diverticulosis. 3. Faint scattered nodular density at the left lung base  may be sequela of prior inflammatory/infectious process or represent developing pneumonia. 4. Aortic Atherosclerosis (ICD10-I70.0). Electronically Signed   By: Anner Crete M.D.   On: 02/02/2021 17:56     A/P: Small bowel obstruction secondary to incarcerated right inguinal hernia with a fairly small aperture.  Fairly tender over this area, concerning for strangulation. -Place NG tube to suction -Plan OR this evening for right groin exploration,  possible bowel resection and repair of the hernia.  I discussed this with the patient as well as with her son and his wife by phone.  Discussed the surgical plan and risks of bleeding, infection, pain, scarring, injury to intra-abdominal or retroperitoneal structures, anastomotic leak if bowel resection is required, hernia recurrence, ileus, and wound healing problems; as well as she is at significantly increased risk of perioperative morbidity and mortality due to her age and medical comorbidities.  They expressed understanding.  I do not think there is a suitable alternative to surgical intervention at this point-attempt at nonoperative therapy has a very high risk of bowel strangulation/perforation as well as ongoing obstruction and sequelae, and they are in agreement.  We will plan to proceed to the OR as soon as available.    Patient Active Problem List   Diagnosis Date Noted  . Chronic systolic heart failure ([place]) 02/02/2018  . Non-STEMI (non-ST elevated myocardial infarction) ([place])   . Systolic dysfunction   . Shortness of breath 01/29/2018  . COPD exacerbation ([place]) 01/07/2013  . Chronic cough 12/04/2012  . Dyspnea on exertion 10/22/2012  . COPD (chronic obstructive pulmonary disease) ([place]) 10/17/2012  . Anemia 08/01/2012  . Benign neoplasm of colon 08/01/2012  . Diverticulosis of colon (without mention of hemorrhage) 08/01/2012  . Insomnia 01/24/2012  . Pacemaker-[employer] Judes 01/24/2012  . Sick sinus syndrome ([place])   . TIA (transient  ischemic attack) 10/11/2011  . HTN (hypertension) 10/11/2011  . Atrial fibrillation ([place]) 10/06/2011  . HYPERLIPIDEMIA 10/22/2010       Romana Juniper, MD [facility]  See AMION to contact appropriate on-call provider

## 2021-02-20 NOTE — H&P (Signed)
History and Physical    Mikayla Harper MWN:[id number] DOB: 1929-10-15 DOA: 01/29/2021  PCP: Haywood Pao, MD   Patient coming from: [facility].  I have personally briefly reviewed patient's old medical records in [place]  Chief Complaint: Constipation, oliguria and emesis.  HPI: Mikayla Harper is a 87 y.o. female with medical history significant of microcytic anemia, left carpal tunnel syndrome, mild unspecified dementia, hyperlipidemia, history of influenza-like illness, osteopenia, paroxysmal atrial fibrillation, sick sinus syndrome/[employer] pacemaker placement in 2013, chronic combined systolic and diastolic HF with an EF of 40 to 40%/HKVQQ 1 diastolic dysfunction, history of TIA, history of tuberculosis over 50 years ago, history of UTI who is coming from [facility] due to constipation, emesis and oliguria.  She received fentanyl earlier and is unable to provide full history she denied having emesis these past few days.  She is able to answer simple questions and denied dyspnea, dizziness, nausea, headache, back or chest pain at the time of examination.  She stated her abdominal pain was a lot better.  ED Course: Initial vital signs were temperature 98.2 F, pulse 135, respirations 23 and blood pressure 112/64 mmHg with an O2 sat of 94% on room air.  The patient received 2 500 mL normal saline boluses, ondansetron 4 mg IVP x1, fentanyl 25 mcg around 1710 fentanyl 50 mcg IVP around 1815.  She was evaluated by general surgery Romana Juniper, MD) and after discussion with the patient and family at decision to take the patient to the OR was made due to very high risk of perforation if treated conservatively.  Lab work: CBC showed a white count of 15.4, hemoglobin 16.7 g/dL (usually between 8.0 and 9.0 g/dL) and platelets 416.  Lipase was 17 units/L.  Lactic acid 2.0 mmol/L.  Sodium 129, potassium 4.6, chloride 85 and CO2 26 mmol/L with an anion gap of 18.  Glucose 102, BUN 97 and  creatinine 2.82 mg/dL with a GFR of 16 mL/min.  Calcium and liver function tests were normal, but the patient has a significant degree to hemoconcentration.  Imaging: CT abdomen/pelvis without contrast showed SBO secondary to herniation of a segment of distal small bowel lateral to the right inguinal canal.  There was sigmoid diverticulosis.  Faint scattered nodular density at the lung base which may be postinfectious/inflammatory.  Aortic atherosclerosis.  Please see images and full detailed report for further detail.  Review of Systems: As per HPI otherwise all other systems reviewed and are negative.  Past Medical History:  Diagnosis Date  . Anemia   . Carpal tunnel syndrome of left wrist 12/20/2010   left hand  . Dementia ([place])   . Hyperlipidemia    on Mevacor  . Influenza-like illness 2009   H1N1 PCR negative  . Osteopenia   . Pacemaker - [facility]    Implant Jan 2013  . PAF (paroxysmal atrial fibrillation) ([place])    previously on propafenone  . Sick sinus syndrome ([place])   . TIA (transient ischemic attack) Dec 2012  . Tuberculosis     hx of over 50 years ago "  . Urinary tract infection     Past Surgical History:  Procedure Laterality Date  . ABDOMINAL HYSTERECTOMY    . BACK SURGERY  1976  . CARDIOVERSION N/A 11/13/2012   Procedure: CARDIOVERSION;  Surgeon: Peter M Martinique, MD;  Location: [facility] ENDOSCOPY;  Service: Cardiovascular;  Laterality: N/A;  . COLONOSCOPY  08/01/2012   Procedure: COLONOSCOPY;  Surgeon: Melene Plan  Ardis Hughs, MD;  Location: [facility] ENDOSCOPY;  Service: Endoscopy;  Laterality: N/A;  . ESOPHAGOGASTRODUODENOSCOPY  08/01/2012   Procedure: ESOPHAGOGASTRODUODENOSCOPY (EGD);  Surgeon: Milus Banister, MD;  Location: [facility] ENDOSCOPY;  Service: Endoscopy;  Laterality: N/A;  . INSERT / REPLACE / REMOVE PACEMAKER  10/21/2011  . LUMBAR FUSION N/A   . OTHER SURGICAL HISTORY  In the late 1960's   Hysterectomy secondary to fibroids  . OTHER SURGICAL HISTORY     cataract surgery  .  PERMANENT PACEMAKER INSERTION N/A 10/21/2011   Procedure: PERMANENT PACEMAKER INSERTION;  Surgeon: Deboraha Sprang, MD;  Location: [facility] CATH LAB;  Service: Cardiovascular;  Laterality: N/A;   Social History  reports that she quit smoking about 32 years ago. Her smoking use included cigarettes. She has a 30.00 pack-year smoking history. She has never used smokeless tobacco. She reports that she does not drink alcohol and does not use drugs.  Allergies  Allergen Reactions  . Lisinopril Other (See Comments)    Dizzy and lightheadedness   Family History  Problem Relation Age of Onset  . Dementia Mother   . Emphysema Father   . Heart disease Father   . Parkinson's disease Sister   . Heart disease Brother   . Cancer Daughter   . COPD Sister    Prior to Admission medications   Medication Sig Start Date End Date Taking? Authorizing Provider  acetaminophen (TYLENOL) 500 MG tablet Take 500 mg by mouth in the morning and at bedtime.   Yes [provider]  albuterol (PROVENTIL HFA;VENTOLIN HFA) 108 (90 BASE) MCG/ACT inhaler Inhale 2 puffs into the lungs every 2 (two) hours as needed for wheezing.   Yes [provider]  aspirin EC 81 MG EC tablet Take 1 tablet (81 mg total) by mouth daily. 02/03/18  Yes Bhagat, Bhavinkumar, PA  busPIRone (BUSPAR) 10 MG tablet Take 10 mg by mouth 2 (two) times daily. 11/17/20  Yes [provider]  busPIRone (BUSPAR) 5 MG tablet Take 5 mg by mouth daily in the afternoon. 01/17/21  Yes [provider]  carvedilol (COREG) 3.125 MG tablet TAKE 1 TABLET BY MOUTH TWICE A DAY WITH A MEAL 05/03/18  Yes Weaver, Scott T, PA-C  cefTRIAXone (ROCEPHIN) 1 g injection Inject 1 g into the muscle daily. 02/17/21  Yes [provider]  Ergocalciferol (VITAMIN D2) 50 MCG (2000 UT) TABS Take 1 tablet by mouth daily.   Yes [provider]  famotidine (PEPCID) 20 MG tablet Take 20 mg by mouth daily. 12/17/19  Yes [provider]   guaiFENesin (MUCINEX) 600 MG 12 hr tablet Take 600 mg by mouth every 12 (twelve) hours.   Yes [provider]  guaiFENesin-dextromethorphan (ROBITUSSIN DM) 100-10 MG/5ML syrup Take 10 mLs by mouth every 4 (four) hours as needed for cough.   Yes [provider]  melatonin 3 MG TABS tablet Take 3 mg by mouth at bedtime.   Yes [provider]  memantine (NAMENDA) 10 MG tablet Take 10 mg by mouth 2 (two) times daily. 08/29/19  Yes [provider]  metroNIDAZOLE (FLAGYL) 500 MG tablet Take 500 mg by mouth 3 (three) times daily.   Yes [provider]  Multiple Vitamins-Minerals (WOMENS MULTIVITAMIN PLUS PO) Take 1 tablet by mouth daily.   Yes [provider]  nitroGLYCERIN (NITROSTAT) 0.4 MG SL tablet Place 1 tablet (0.4 mg total) under the tongue every 5 (five) minutes x 3 doses as needed for chest pain. 02/02/18  Yes Bhagat,  Bhavinkumar, PA  Nutritional Supplements (RESOURCE 2.0 PO) Take 120 Units by mouth in the morning and at bedtime.   Yes [provider]  ondansetron (ZOFRAN) 4 MG tablet Take 4 mg by mouth every 8 (eight) hours as needed for nausea or vomiting.   Yes [provider]  polyethylene glycol (MIRALAX / GLYCOLAX) 17 g packet Take 17 g by mouth 2 (two) times daily.   Yes [provider]  sennosides-docusate sodium (SENOKOT-S) 8.6-50 MG tablet Take 1 tablet by mouth at bedtime.   Yes [provider]  Simethicone 180 MG CAPS Take 1 capsule by mouth in the morning, at noon, and at bedtime.   Yes [provider]  sodium phosphate Pediatric (FLEET) 3.5-9.5 GM/59ML enema Place 1 enema rectally once as needed for severe constipation.   Yes [provider]  SPIRIVA HANDIHALER 18 MCG inhalation capsule Place 1 capsule into inhaler and inhale daily. 12/30/20  Yes [provider]   Physical Exam: Vitals:   02/01/2021 1815 02/13/2021 1900 02/09/2021 1915 02/03/2021 1943  BP: (!) 123/99 (!)  109/96 109/72 106/71  Pulse: (!) 54 (!) 132 81 90  Resp: 19 (!) 26 19 16   Temp:    97.6 F (36.4 C)  TempSrc:    Oral  SpO2: 95% 92% 93% 98%   Constitutional: Frail, elderly female in NAD. Eyes: PERRL, lids and conjunctivae normal ENMT: Mucous membranes and lips are dry. Posterior pharynx clear of any exudate or lesions. Neck: normal, supple, no masses, no thyromegaly Respiratory: clear to auscultation bilaterally, no wheezing, no crackles. Normal respiratory effort. No accessory muscle use.  Cardiovascular: Irregularly irregular, tachycardic in the 110s, no murmurs / rubs / gallops. No extremity edema. 2+ pedal pulses. No carotid bruits.  Abdomen: Mild distention. Bowel sounds positive.  RLQ tenderness without guarding or rebound, no masses palpated. No hepatosplenomegaly.  Musculoskeletal: Mild to moderate generalized weakness.  No clubbing / cyanosis. Good ROM, no contractures. Normal muscle tone.  Skin: no rashes, lesions, ulcers on very limited examination. Neurologic: CN 2-12 grossly intact. Sensation intact, DTR normal. Strength grossly symmetric bilaterally. Psychiatric:  Alert and oriented x 2, partially oriented to time and situation.  Labs on Admission: I have personally reviewed following labs and imaging studies  CBC: Recent Labs  Lab 02/19/2021 1653  WBC 15.4*  NEUTROABS 12.0*  HGB 16.7*  HCT 49.7*  MCV 94.3  PLT 416*    Basic Metabolic Panel: Recent Labs  Lab 02/17/2021 1653  NA 129*  K 4.6  CL 85*  CO2 26  GLUCOSE 102*  BUN 97*  CREATININE 2.82*  CALCIUM 10.3    GFR: CrCl cannot be calculated (Unknown ideal weight.).  Liver Function Tests: Recent Labs  Lab 02/09/2021 1653  AST 38  ALT 21  ALKPHOS 82  BILITOT 1.2  PROT 7.8  ALBUMIN 3.7   Radiological Exams on Admission: CT Abdomen Pelvis Wo Contrast  Result Date: 01/26/2021 CLINICAL DATA:  87 year old female with abdominal pain. Concern for bowel obstruction. EXAM: CT ABDOMEN AND PELVIS  WITHOUT CONTRAST TECHNIQUE: Multidetector CT imaging of the abdomen and pelvis was performed following the standard protocol without IV contrast. COMPARISON:  None. FINDINGS: Evaluation of this exam is limited in the absence of intravenous contrast. Lower chest: Right lung base linear and ground-glass, likely atelectasis/scarring. Faint scattered nodular density at the left lung base may be sequela of prior inflammatory/infectious process or represent developing pneumonia. Clinical correlation and follow-up with chest CT on a nonemergent/outpatient basis recommended. Partially  visualized pericardial effusion measuring 8 mm in thickness. There is coronary vascular calcification and cardiac pacemaker wire. No intra-abdominal free air or free fluid. Hepatobiliary: The liver is unremarkable. No intrahepatic biliary dilatation. The gallbladder is unremarkable. Pancreas: Unremarkable. No pancreatic ductal dilatation or surrounding inflammatory changes. Spleen: Normal in size without focal abnormality. Adrenals/Urinary Tract: The adrenal glands unremarkable. Moderate bilateral renal parenchyma atrophy. There is no hydronephrosis or nephrolithiasis on either side. The visualized ureters and urinary bladder appear unremarkable. Stomach/Bowel: There is sigmoid diverticulosis without active inflammatory changes. There is herniation of a segment of distal small bowel lateral to the right inguinal canal. There is inflammatory changes of the herniated bowel with severe narrowing of the bowel segment in the neck of the hernia. There is diffuse dilatation of the small bowel loops proximal to the hernia. The small bowel loops measure up to 3.5 cm in caliber. No evidence of acute appendicitis. Vascular/Lymphatic: Advanced aortoiliac atherosclerotic disease. The IVC is unremarkable. No portal venous gas. There is no adenopathy. Reproductive: Hysterectomy. Other: None Musculoskeletal: Osteopenia with scoliosis and degenerative changes.  No acute osseous pathology. IMPRESSION: 1. Small-bowel obstruction secondary to herniation of a segment of distal small bowel lateral to the right inguinal canal. 2. Sigmoid diverticulosis. 3. Faint scattered nodular density at the left lung base may be sequela of prior inflammatory/infectious process or represent developing pneumonia. 4. Aortic Atherosclerosis (ICD10-I70.0). Electronically Signed   By: Anner Crete M.D.   On: 01/30/2021 17:56   01/30/2018 echocardiogram  -------------------------------------------------------------------  LV EF: 40% -  45%   -------------------------------------------------------------------  Indications:   Dyspnea 786.09.   -------------------------------------------------------------------  History:  PMH:  Transient ischemic attack. Chronic obstructive  pulmonary disease. Risk factors: Hypertension. Dyslipidemia.   -------------------------------------------------------------------  Study Conclusions   - Left ventricle: The cavity size was normal. Wall thickness was  normal. Systolic function was mildly to moderately reduced. The  estimated ejection fraction was in the range of 40% to 45%. There  is hypokinesis of the apical myocardium. Doppler parameters are  consistent with abnormal left ventricular relaxation (grade 1  diastolic dysfunction). Doppler parameters are consistent with  high ventricular filling pressure.  - Aortic valve: There was mild regurgitation.  - Pericardium, extracardiac: A trivial pericardial effusion was  identified.   Impressions:   - Hypokinesis of the apex with overall mild to moderate LV  dysfunction; mild diastolic dysfunction; no apical thrombus using  definity; mild AI.   EKG: Independently reviewed.  Vent. rate 141 BPM PR interval * ms QRS duration 83 ms QT/QTcB 294/451 ms P-R-T axes * -57 80 Atrial fibrillation with rapid V-rate Ventricular premature complex Left anterior  fascicular block Consider anterior infarct  Assessment/Plan Principal Problem:   SBO (small bowel obstruction) (HCC) Place in observation/progressive care unit. Taken urgently to the OR due to high risk of perforation. Continue careful IV hydration. Preop/postop management per general surgery. Analgesics and antiemetics as needed. Pantoprazole 40 mg IV PV.  Active Problems:   Paroxysmal atrial fibrillation with RVR (HCC) CHA?DS?-VASc Score of at least 8. Age, female, atherosclerosis, HTN, HF, TIA hx. Off anticoagulation due to age and other risks. Secondary to volume depletion. Continue rehydration with IVF. Continue beta-blocker twice daily.    Chronic combined systolic and diastolic CHF (congestive heart failure) (HCC) No signs of decompensation. Monitor intake and output. Judicious IV hydration. Continue carvedilol 3.125 mg p.o. twice daily. Has side effects to ACE inhibitor so will defer its use. Will obtain a routine echocardiogram.    Acute renal  failure superimposed on stage 3a chronic kidney disease ([place]) Prerenal azotemia in the setting of dehydration secondary to emesis. Continue IV hydration with normal saline. Monitor intake and output.    Dehydration with hyponatremia Due to GI losses. Continue gentle hydration with normal saline. Follow-up sodium level.    Lactic acidosis Likely due to hypovolemia. Continue normal saline infusion. Follow-up lactic acid level.    Leukocytosis/polycythemia/thrombocytopenia Due to hemoconcentration in the setting of dehydration.   Leukocytosis    HTN (hypertension) Continue carvedilol 3.125 mg p.o. daily. Monitor blood pressure and heart rate.    COPD (chronic obstructive pulmonary disease) (HCC) Asymptomatic at this time. Bronchodilators and nebulized SABA as needed.    Aortic atherosclerosis ([place]) Seen on CT. No longer taking statin.    Hyperlipidemia Off mevastatin. Reinitiation of therapy to be considered  by PCP.   DVT prophylaxis: SCDs.  Pharmacological once cleared by general surgery. Code Status:   Full code (pending nursing home documents review). Family Communication: Disposition Plan:   Patient is from:  Home.  Anticipated DC to:  Home.  Anticipated DC date:  02/22/2021 or 02/23/2021.  Anticipated DC barriers: Postop clinical status.  Consults called:  Romana Juniper, MD (general surgery). Admission status:  Observation/PCU.  High severity due to presenting with nausea, emesis, constipation, abdominal pain in the setting of nonreducible  Severity of Illness:  Reubin Milan MD [facility]  How to contact the [facility] Attending or Consulting provider [place] or covering provider during after hours [place], for this patient?   1. Check the care team in [facility] and look for a) attending/consulting [facility] provider listed and b) the [facility] team listed 2. Log into [email or web address] and use  [place] universal password to access. If you do not have the password, please contact the hospital operator. 3. Locate the [facility] provider you are looking for under [facility] and page to a number that you can be directly reached. 4. If you still have difficulty reaching the provider, please page the [facility] (Director on Call) for the Hospitalists listed on amion for assistance.  02/13/2021, 9:17 PM   This document was prepared using Dragon voice recognition software and may contain some unintended transcription errors.

## 2021-02-20 NOTE — ED Provider Notes (Signed)
Hernia reduction  Date/Time: 02/23/2021 6:56 PM Performed by: Hayden Rasmussen, MD Authorized by: Hayden Rasmussen, MD  Consent: Verbal consent obtained. Consent given by: patient Patient understanding: patient states understanding of the procedure being performed Patient identity confirmed: verbally with patient Time out: Immediately prior to procedure a "time out" was called to verify the correct patient, procedure, equipment, support staff and site/side marked as required. Local anesthesia used: no  Anesthesia: Local anesthesia used: no  Sedation: Patient sedated: no  Patient tolerance: patient tolerated the procedure well with no immediate complications Comments: unsuccessful       Hayden Rasmussen, MD 02/04/2021 2042

## 2021-02-20 NOTE — Transfer of Care (Signed)
Immediate Anesthesia Transfer of Care Note  Patient: Mikayla Harper  Procedure(s) Performed: Exploratory Laparotomy, Small Bowel Resection, Repair Right Inguinal Hernia (Right Abdomen)  Patient Location: PACU  Anesthesia Type:General  Level of Consciousness: drowsy  Airway & Oxygen Therapy: Patient Spontanous Breathing and Patient connected to face mask oxygen  Post-op Assessment: Report given to RN and Post -op Vital signs reviewed and stable  Post vital signs: Reviewed and stable  Last Vitals:  Vitals Value Taken Time  BP 119/77 02/10/2021 2258  Temp    Pulse 52 02/05/2021 2259  Resp 25 01/25/2021 2259  SpO2 94 % 02/11/2021 2259  Vitals shown include unvalidated device data.  Last Pain:  Vitals:   01/29/2021 1946  TempSrc:   PainSc: 4          Complications: No complications documented.

## 2021-02-20 NOTE — ED Provider Notes (Signed)
[facility] EMERGENCY DEPARTMENT Provider Note   CSN: [id number] Arrival date & time: 02/14/2021  1613     History No chief complaint on file.   [name] is a 87 y.o. female past medical history of permanent atrial fibrillation, sick sinus syndrome with [facility] pacemaker, dementia, TIA, CAD, hypertension, COPD, CHF, presenting to the emergency department from [facility] facility.  It is reported that patient has not had a bowel movement in about 4 days.  She began vomiting last night which was treated with Zofran.  Is also reported that they treated her constipation with an enema with no relief.  She began vomiting again today upon arrival to the ED.  She complains of right-sided lower abdominal pain.  EMS reports they were told she had not urinated either for 4 days, patient states she normally did today which was normal.  She is not having any difficulties with urination.  She is alert, oriented to person.   Per review of medical record, history abdominal surgeries include abdominal hysterectomy.   The history is provided by the patient and the EMS personnel.       Past Medical History:  Diagnosis Date  . Anemia   . Carpal tunnel syndrome of left wrist 12/20/2010   left hand  . Dementia ([place])   . Hyperlipidemia    on Mevacor  . Influenza-like illness 2009   H1N1 PCR negative  . Osteopenia   . Pacemaker - [facility]    Implant Jan 2013  . PAF (paroxysmal atrial fibrillation) ([place])    previously on propafenone  . Sick sinus syndrome ([place])   . TIA (transient ischemic attack) Dec 2012  . Tuberculosis     hx of over 50 years ago "  . Urinary tract infection     Patient Active Problem List   Diagnosis Date Noted  . SBO (small bowel obstruction) ([place]) 02/09/2021  . Chronic systolic heart failure ([place]) 02/02/2018  . Non-STEMI (non-ST elevated myocardial infarction) ([place])   . Systolic dysfunction   . Shortness of breath 01/29/2018  . COPD  exacerbation ([place]) 01/07/2013  . Chronic cough 12/04/2012  . Dyspnea on exertion 10/22/2012  . COPD (chronic obstructive pulmonary disease) ([place]) 10/17/2012  . Anemia 08/01/2012  . Benign neoplasm of colon 08/01/2012  . Diverticulosis of colon (without mention of hemorrhage) 08/01/2012  . Insomnia 01/24/2012  . Pacemaker-[facility] 01/24/2012  . Sick sinus syndrome ([place])   . TIA (transient ischemic attack) 10/11/2011  . HTN (hypertension) 10/11/2011  . Atrial fibrillation ([place]) 10/06/2011  . HYPERLIPIDEMIA 10/22/2010    Past Surgical History:  Procedure Laterality Date  . ABDOMINAL HYSTERECTOMY    . BACK SURGERY  1976  . CARDIOVERSION N/A 11/13/2012   Procedure: CARDIOVERSION;  Surgeon: Peter M Martinique, MD;  Location: [facility] ENDOSCOPY;  Service: Cardiovascular;  Laterality: N/A;  . COLONOSCOPY  08/01/2012   Procedure: COLONOSCOPY;  Surgeon: Milus Banister, MD;  Location: [facility] ENDOSCOPY;  Service: Endoscopy;  Laterality: N/A;  . ESOPHAGOGASTRODUODENOSCOPY  08/01/2012   Procedure: ESOPHAGOGASTRODUODENOSCOPY (EGD);  Surgeon: Milus Banister, MD;  Location: [facility] ENDOSCOPY;  Service: Endoscopy;  Laterality: N/A;  . INSERT / REPLACE / REMOVE PACEMAKER  10/21/2011  . LUMBAR FUSION N/A   . OTHER SURGICAL HISTORY  In the late 1960's   Hysterectomy secondary to fibroids  . OTHER SURGICAL HISTORY     cataract surgery  . PERMANENT PACEMAKER INSERTION N/A 10/21/2011   Procedure: PERMANENT PACEMAKER INSERTION;  Surgeon: Deboraha Sprang, MD;  Location: [facility] CATH LAB;  Service: Cardiovascular;  Laterality: N/A;     OB History   No obstetric history on file.     Family History  Problem Relation Age of Onset  . Dementia Mother   . Emphysema Father   . Heart disease Father   . Parkinson's disease Sister   . Heart disease Brother   . Cancer Daughter   . COPD Sister     Social History   Tobacco Use  . Smoking status: Former Smoker    Packs/day: 1.00    Years: 30.00    Pack years: 30.00    Types:  Cigarettes    Quit date: 10/05/1988    Years since quitting: 32.4  . Smokeless tobacco: Never Used  Vaping Use  . Vaping Use: Never used  Substance Use Topics  . Alcohol use: No  . Drug use: No    Home Medications Prior to Admission medications   Medication Sig Start Date End Date Taking? Authorizing Provider  acetaminophen (TYLENOL) 500 MG tablet Take 500 mg by mouth in the morning and at bedtime.   Yes [provider]  albuterol (PROVENTIL HFA;VENTOLIN HFA) 108 (90 BASE) MCG/ACT inhaler Inhale 2 puffs into the lungs every 2 (two) hours as needed for wheezing.   Yes [provider]  aspirin EC 81 MG EC tablet Take 1 tablet (81 mg total) by mouth daily. 02/03/18  Yes Bhagat, Bhavinkumar, PA  busPIRone (BUSPAR) 10 MG tablet Take 10 mg by mouth 2 (two) times daily. 11/17/20  Yes [provider]  busPIRone (BUSPAR) 5 MG tablet Take 5 mg by mouth daily in the afternoon. 01/17/21  Yes [provider]  carvedilol (COREG) 3.125 MG tablet TAKE 1 TABLET BY MOUTH TWICE A DAY WITH A MEAL 05/03/18  Yes Weaver, Scott T, PA-C  cefTRIAXone (ROCEPHIN) 1 g injection Inject 1 g into the muscle daily. 02/17/21  Yes [provider]  Ergocalciferol (VITAMIN D2) 50 MCG (2000 UT) TABS Take 1 tablet by mouth daily.   Yes [provider]  famotidine (PEPCID) 20 MG tablet Take 20 mg by mouth daily. 12/17/19  Yes [provider]  guaiFENesin (MUCINEX) 600 MG 12 hr tablet Take 600 mg by mouth every 12 (twelve) hours.   Yes [provider]  guaiFENesin-dextromethorphan (ROBITUSSIN DM) 100-10 MG/5ML syrup Take 10 mLs by mouth every 4 (four) hours as needed for cough.   Yes [provider]  melatonin 3 MG TABS tablet Take 3 mg by mouth at bedtime.   Yes [provider]  memantine (NAMENDA) 10 MG tablet Take 10 mg by mouth 2 (two) times daily. 08/29/19  Yes [provider]  metroNIDAZOLE (FLAGYL) 500 MG tablet Take 500 mg by mouth 3  (three) times daily.   Yes [provider]  Multiple Vitamins-Minerals (WOMENS MULTIVITAMIN PLUS PO) Take 1 tablet by mouth daily.   Yes [provider]  nitroGLYCERIN (NITROSTAT) 0.4 MG SL tablet Place 1 tablet (0.4 mg total) under the tongue every 5 (five) minutes x 3 doses as needed for chest pain. 02/02/18  Yes Bhagat, Bhavinkumar, PA  Nutritional Supplements (RESOURCE 2.0 PO) Take 120 Units by mouth in the morning and at bedtime.   Yes [provider]  ondansetron (ZOFRAN) 4 MG tablet Take 4 mg by mouth every 8 (eight) hours as needed for nausea or vomiting.   Yes [provider]  polyethylene glycol (MIRALAX / GLYCOLAX) 17 g packet  Take 17 g by mouth 2 (two) times daily.   Yes [provider]  sennosides-docusate sodium (SENOKOT-S) 8.6-50 MG tablet Take 1 tablet by mouth at bedtime.   Yes [provider]  Simethicone 180 MG CAPS Take 1 capsule by mouth in the morning, at noon, and at bedtime.   Yes [provider]  sodium phosphate Pediatric (FLEET) 3.5-9.5 GM/59ML enema Place 1 enema rectally once as needed for severe constipation.   Yes [provider]  SPIRIVA HANDIHALER 18 MCG inhalation capsule Place 1 capsule into inhaler and inhale daily. 12/30/20  Yes [provider]    Allergies    Lisinopril  Review of Systems   Review of Systems  Constitutional: Negative for fever.  Gastrointestinal: Positive for abdominal pain, constipation, nausea and vomiting. Negative for diarrhea.  Genitourinary: Negative for dysuria.  All other systems reviewed and are negative.   Physical Exam Updated Vital Signs BP 109/72   Pulse 81   Temp 98.2 F (36.8 C) (Oral)   Resp 19   SpO2 93%   Physical Exam Vitals and nursing note reviewed.  Constitutional:      Appearance: She is well-developed. She is ill-appearing.  HENT:     Head: Normocephalic and atraumatic.  Eyes:     Conjunctiva/sclera: Conjunctivae normal.   Cardiovascular:     Rate and Rhythm: Tachycardia present. Rhythm irregular.  Pulmonary:     Effort: Pulmonary effort is normal. No respiratory distress.     Breath sounds: Normal breath sounds.  Abdominal:     General: Bowel sounds are normal.     Palpations: Abdomen is soft. There is mass (RLQ firm immobile mass just superior to inguinal ligment. Mass is about the size of a mango.).     Tenderness: There is abdominal tenderness. There is no guarding or rebound.  Skin:    General: Skin is warm.  Neurological:     Mental Status: She is alert.  Psychiatric:        Behavior: Behavior normal.     ED Results / Procedures / Treatments   Labs (all labs ordered are listed, but only abnormal results are displayed) Labs Reviewed  COMPREHENSIVE METABOLIC PANEL - Abnormal; Notable for the following components:      Result Value   Sodium 129 (*)    Chloride 85 (*)    Glucose, Bld 102 (*)    BUN 97 (*)    Creatinine, Ser 2.82 (*)    GFR, Estimated 16 (*)    Anion gap 18 (*)    All other components within normal limits  LIPASE, BLOOD - Abnormal; Notable for the following components:   Lipase 70 (*)    All other components within normal limits  CBC WITH DIFFERENTIAL/PLATELET - Abnormal; Notable for the following components:   WBC 15.4 (*)    RBC 5.27 (*)    Hemoglobin 16.7 (*)    HCT 49.7 (*)    Platelets 416 (*)    Neutro Abs 12.0 (*)    Monocytes Absolute 1.5 (*)    Abs Immature Granulocytes 0.11 (*)    All other components within normal limits  LACTIC ACID, PLASMA - Abnormal; Notable for the following components:   Lactic Acid, Venous 2.0 (*)    All other components within normal limits  RESP PANEL BY RT-PCR (FLU A&B, COVID) ARPGX2  URINALYSIS, ROUTINE W REFLEX MICROSCOPIC  LACTIC ACID, PLASMA    EKG EKG Interpretation  Date/Time:  Saturday Feb 20 2021 16:28:20 EDT  Ventricular Rate:  141 PR Interval:    QRS Duration: 83 QT Interval:  294 QTC Calculation: 451 R  Axis:   -57 Text Interpretation: Atrial fibrillation with rapid V-rate Ventricular premature complex Left anterior fascicular block Consider anterior infarct afib replacing sinus and rate increased since prior 5/19 Confirmed by Aletta Edouard [phone]) on 01/29/2021 6:06:28 PM   Radiology CT Abdomen Pelvis Wo Contrast  Result Date: 01/29/2021 CLINICAL DATA:  87 year old female with abdominal pain. Concern for bowel obstruction. EXAM: CT ABDOMEN AND PELVIS WITHOUT CONTRAST TECHNIQUE: Multidetector CT imaging of the abdomen and pelvis was performed following the standard protocol without IV contrast. COMPARISON:  None. FINDINGS: Evaluation of this exam is limited in the absence of intravenous contrast. Lower chest: Right lung base linear and ground-glass, likely atelectasis/scarring. Faint scattered nodular density at the left lung base may be sequela of prior inflammatory/infectious process or represent developing pneumonia. Clinical correlation and follow-up with chest CT on a nonemergent/outpatient basis recommended. Partially visualized pericardial effusion measuring 8 mm in thickness. There is coronary vascular calcification and cardiac pacemaker wire. No intra-abdominal free air or free fluid. Hepatobiliary: The liver is unremarkable. No intrahepatic biliary dilatation. The gallbladder is unremarkable. Pancreas: Unremarkable. No pancreatic ductal dilatation or surrounding inflammatory changes. Spleen: Normal in size without focal abnormality. Adrenals/Urinary Tract: The adrenal glands unremarkable. Moderate bilateral renal parenchyma atrophy. There is no hydronephrosis or nephrolithiasis on either side. The visualized ureters and urinary bladder appear unremarkable. Stomach/Bowel: There is sigmoid diverticulosis without active inflammatory changes. There is herniation of a segment of distal small bowel lateral to the right inguinal canal. There is inflammatory changes of the herniated bowel with severe  narrowing of the bowel segment in the neck of the hernia. There is diffuse dilatation of the small bowel loops proximal to the hernia. The small bowel loops measure up to 3.5 cm in caliber. No evidence of acute appendicitis. Vascular/Lymphatic: Advanced aortoiliac atherosclerotic disease. The IVC is unremarkable. No portal venous gas. There is no adenopathy. Reproductive: Hysterectomy. Other: None Musculoskeletal: Osteopenia with scoliosis and degenerative changes. No acute osseous pathology. IMPRESSION: 1. Small-bowel obstruction secondary to herniation of a segment of distal small bowel lateral to the right inguinal canal. 2. Sigmoid diverticulosis. 3. Faint scattered nodular density at the left lung base may be sequela of prior inflammatory/infectious process or represent developing pneumonia. 4. Aortic Atherosclerosis (ICD10-I70.0). Electronically Signed   By: Anner Crete M.D.   On: 01/31/2021 17:56    Procedures Procedures   Medications Ordered in ED Medications  ceFAZolin (ANCEF) IVPB 1 g/50 mL premix (has no administration in time range)  sodium chloride 0.9 % bolus 500 mL (has no administration in time range)  metoprolol tartrate (LOPRESSOR) injection 5 mg (has no administration in time range)  lactated ringers bolus 1,000 mL (has no administration in time range)  ondansetron (ZOFRAN) injection 4 mg (4 mg Intravenous Given 02/19/2021 1651)  fentaNYL (SUBLIMAZE) injection 25 mcg (25 mcg Intravenous Given 02/10/2021 1710)  sodium chloride 0.9 % bolus 500 mL (0 mLs Intravenous Stopped 02/07/2021 1810)  fentaNYL (SUBLIMAZE) injection 50 mcg (50 mcg Intravenous Given 02/09/2021 1835)    ED Course  I have reviewed the triage vital signs and the nursing notes.  Pertinent labs & imaging results that were available during my care of the patient were reviewed by me and considered in my medical decision making (see chart for details).  Clinical Course as of 02/11/2021 1940  Sat Feb 20, 5765  3325  87 year old female sent  in from her facility for evaluation of decreased stool output decreased urination over the last few days.  Abdomen is soft.  Getting labs and CT imaging.  Disposition per results of testing. [MB]  2683 I attempted a bedside reduction of her right inguinal hernia that is incarcerated.  She was put in reverse Trendelenburg and ice was applied.  She was given some pain medicine.  Manual reduction was attempted with minimal improvement.  Will consult general surgery. [MB]  4196 Discussed with Dr. Kae Heller with general surgery.  She will evaluate patient in the ED. [JR]  1920 Dr. Kae Heller was also unsuccessful at hernia reduction. Requests medicine admit. Will likely bring to OR [JR]    Clinical Course User Index [JR] Lanesha Azzaro, [place] N, PA-C [MB] Hayden Rasmussen, MD   MDM Rules/Calculators/A&P                          Patient is an 87 year old female presenting from nursing home for 4 days of constipation and decreased urine output.  He is complaining of right lower abdominal pain, began vomiting yesterday.  Vomiting on evaluation, nonbloody nonbilious emesis.  She is also noted to be in A. fib with RVR.  Per recent cardiology visit in April she is noted to have persistent A. fib though has relatively been rate controlled.  Not on anticoagulation.  Abdominal surgeries include hysterectomy.  Abdominal exam reveals firm tender mass in the right lower abdomen.  Abdomen is otherwise soft.  She is afebrile.  IV fluids, pain medication antiemetics ordered.  CT scan is ordered as well with concern for obstruction.  Blood work shows leukocytosis, hemoglobin is elevated, likely hemoconcentration due to dehydration.  As patient also has AKI with creatinine of 2.82.  Lipase is minimally elevated.  Lactic acid is 2.  She is given IV fluids.  CT scan shows distal small bowel hernia in the right inguinal region with obstruction.  Unable to reduce at bedside with attending physician Dr. Melina Copa,  after patient was placed in Trendelenburg with ice pack on hernia.  She tolerated very well, fentanyl is providing adequate pain relief.  No longer vomiting after Zofran.  Dr. Windle Guard with general surgery was consulted, was also unsuccessful at the bedside.  Will take to the OR likely.  Medicine to admit.  RegardingFinal Clinical Impression(s) / ED Diagnoses Final diagnoses:  Acute renal insufficiency  Atrial fibrillation with RVR ([place])  Inguinal hernia with obstruction    Rx / DC Orders ED Discharge Orders    None       Davonne Baby, [place] N, PA-C 02/03/2021 2014    Hayden Rasmussen, MD 02/08/2021 2234

## 2021-02-20 NOTE — ED Notes (Signed)
Critical lactic results reported to Martinique PA

## 2021-02-20 NOTE — Op Note (Signed)
Operative Note  Mikayla Harper  [id number]  [id number]  02/19/2021   Surgeon: Vikki Ports A ConnorMD  Procedure performed: Right groin exploration, primary repair of strangulated right inguinal hernia with high ligation of the sac, exploratory laparotomy, small bowel resection  Procedure status: Emergency Infection present at time of surgery: Yes, feculent peritonitis and necrotic bowel  Preop diagnosis: Incarcerated right inguinal hernia Post-op diagnosis/intraop findings: Strangulated right inguinal hernia with necrotic and perforated bowel  Specimens: Small bowel resection Retained items: telfa wick x 3 in midline incision EBL: 37CH Complications: none  Description of procedure: After obtaining informed consent the patient was taken to the operating room and placed supine on operating room table wheregeneral endotracheal anesthesia was initiated, preoperative antibiotics were administered, SCDs applied, and a formal timeout was performed.  A nasogastric tube was placed as well as a Foley catheter, and an arterial line.  The abdomen was prepped and draped in usual sterile fashion.  An oblique incision was made in the right groin and the soft tissues dissected with cautery until the hernia sac was encountered.  There was no discernible external oblique aponeurosis.  The hernia sac appeared partially necrotic.  This was carefully opened and revealed necrotic strangulated bowel.  When this was begun to be manipulated in order to identify the hernia defect, bubbling and succus was noted emanating up from the hernia defect consistent with perforation.  The hernia defect was essentially 1 cm in diameter; and a small bowel resection through this would not be feasible.  Therefore a low midline laparotomy was created and the small bowel eviscerated.  Upon entering the abdomen (organ space), I encountered feculent peritonitis. In order to reduce the bowel and fully expose the necrotic segment, I did  end up amputating the strangulated segment at the level of the external orifice of the hernia defect.  This allowed the remaining small bowel to be reduced and the 2 open ends of bowel within essentially obliterated intervening mesentery were easily identified.  These were temporarily closed with suture and the necrotic segment excised with serial fires of the 75 mm blue load Endo GIA stapler.  The intervening mesentery was divided with the Enseal.  A side-to-side functional end-to-end small bowel anastomosis was created with another 75 mm blue load Endo GIA.  The common enterotomy was closed with another 75 mm blue load stapler.  The mesenteric defect was closed with interrupted 2-0silks.  Contents were milked from proximal to distal confirming patency of the anastomosis however unfortunately this revealed a fairly large leak at the corner of the common enterotomy closure.  The common enterotomy was resected with another fire of the 75 mm blue load Endo GIA stapler and this appeared to be intact.  However when bowel contents were milked forward again and pressure applied through the anastomosis, the staple line then leaked in a different location.  At this point I chose to resect the anastomosis and created a new one in a similar fashion.  This appeared well perfused, hemostatic, tension-free and widely patent.  The mesenteric defect here was closed with a running 2-0 silk.  The rest of the small bowel was inspected and confirmed to be free of injury.  Stool was aspirated from the pelvis and lower abdomen and then the abdominal cavity was irrigated with several liters of warm sterile saline.  The effluent was clear.  The bowel was returned to the abdominal cavity with normal anatomic orientation.  The NG tube was palpated in the stomach confirming  location.  The fascia at the midline was closed with looped #1 PDS starting at either end and tying centrally.  The soft tissues were irrigated.  I then turned to the  right groin again.  The hernia sac was skeletonized down to the level of the hernia defect where it was clamped with a Claiborne Billings and the excess excised.  The remainder was suture ligated with a pursestring 2-0 silk and reduced through the hernia defect.  The defect was then closed with a simple interrupted 2-0 silk.  The right groin tissues were then reapproximated in several layers using interrupted 3-0 Vicryl's.  Both skin incisions were closed very loosely with staples in the midline incision worked with 3 Telfa wicks which will need to be removed in about 48 hours. The patient was then awakened, extubated and taken to PACU in stable condition.   All counts were correct at the completion of the case.

## 2021-02-20 NOTE — ED Triage Notes (Signed)
Pt arrives via EMS from [facility] facility. Reports no BM/ no urine output  X4 days. Facility gave enema with no change. Vomiting began yesterday

## 2021-02-20 NOTE — ED Notes (Signed)
Ice placed on abdomin. Pt in trendelenburg

## 2021-02-20 NOTE — ED Notes (Signed)
Pt actively vomiting.

## 2021-02-20 NOTE — ED Notes (Signed)
Bladder scan 57mL

## 2021-02-20 NOTE — Anesthesia Procedure Notes (Signed)
Procedure Name: Intubation Date/Time: 02/05/2021 8:31 PM Performed by: Clovis Cao, CRNA Pre-anesthesia Checklist: Patient identified, Emergency Drugs available, Suction available, Patient being monitored and Timeout performed Patient Re-evaluated:Patient Re-evaluated prior to induction Oxygen Delivery Method: Circle system utilized Preoxygenation: Pre-oxygenation with 100% oxygen Induction Type: IV induction, Rapid sequence and Cricoid Pressure applied Laryngoscope Size: Miller and 2 Grade View: Grade I Tube type: Oral Tube size: 8.0 mm Number of attempts: 1 Airway Equipment and Method: Stylet Placement Confirmation: ETT inserted through vocal cords under direct vision,  positive ETCO2 and breath sounds checked- equal and bilateral Secured at: 21 cm Tube secured with: Tape Dental Injury: Teeth and Oropharynx as per pre-operative assessment

## 2021-02-20 NOTE — Progress Notes (Signed)
PHARMACY NOTE:  ANTIMICROBIAL RENAL DOSAGE ADJUSTMENT  Current antimicrobial regimen includes a mismatch between antimicrobial dosage and estimated renal function.  As per policy approved by the Pharmacy & Therapeutics and Medical Executive Committees, the antimicrobial dosage will be adjusted accordingly.  Current antimicrobial dosage:  Cefazolin 2 gm IV Q 8 hours  Indication: Surgical prophylaxis  Renal Function:  CrCl ~ 10-15 mL/min []      On intermittent HD, scheduled: []      On CRRT    Antimicrobial dosage has been changed to:  Cefazolin 1 gm IV Q 12 hours   Additional comments:   Thank you for allowing pharmacy to be a part of this patient's care.  Albertina Parr, PharmD., BCPS, BCCCP Clinical Pharmacist Please refer to [facility] for unit-specific pharmacist

## 2021-02-20 NOTE — Anesthesia Preprocedure Evaluation (Addendum)
Anesthesia Evaluation  Patient identified by MRN, date of birth, ID band Patient confused    Reviewed: Allergy & Precautions, NPO status , Patient's Chart, lab work & pertinent test results, reviewed documented beta blocker date and time   Airway Mallampati: II  TM Distance: >3 FB Neck ROM: Full    Dental  (+) Dental Advisory Given, Upper Dentures, Edentulous Lower   Pulmonary COPD,  COPD inhaler, former smoker,    Pulmonary exam normal breath sounds clear to auscultation       Cardiovascular hypertension, Pt. on home beta blockers + Past MI  Normal cardiovascular exam+ dysrhythmias Atrial Fibrillation + pacemaker  Rhythm:Regular Rate:Normal  Echo 01/30/2018:  Study Conclusions   - Left ventricle: The cavity size was normal. Wall thickness was  normal. Systolic function was mildly to moderately reduced. The  estimated ejection fraction was in the range of 40% to 45%. There  is hypokinesis of the apical myocardium. Doppler parameters are  consistent with abnormal left ventricular relaxation (grade 1  diastolic dysfunction). Doppler parameters are consistent with  high ventricular filling pressure.  - Aortic valve: There was mild regurgitation.  - Pericardium, extracardiac: A trivial pericardial effusion was  identified.   Neuro/Psych PSYCHIATRIC DISORDERS Dementia TIA   GI/Hepatic Neg liver ROS, GERD  Medicated,Possible incarceratied or obstruction right hernia   Endo/Other  negative endocrine ROS  Renal/GU Renal disease (AKI)     Musculoskeletal negative musculoskeletal ROS (+)   Abdominal   Peds  Hematology negative hematology ROS (+)   Anesthesia Other Findings   Reproductive/Obstetrics                            Anesthesia Physical Anesthesia Plan  ASA: IV and emergent  Anesthesia Plan: General   Post-op Pain Management:    Induction: Intravenous  PONV Risk  Score and Plan: 3 and Dexamethasone and Ondansetron  Airway Management Planned: Oral ETT  Additional Equipment: Arterial line  Intra-op Plan:   Post-operative Plan: Possible Post-op intubation/ventilation  Informed Consent: I have reviewed the patients History and Physical, chart, labs and discussed the procedure including the risks, benefits and alternatives for the proposed anesthesia with the patient or authorized representative who has indicated his/her understanding and acceptance.     Dental advisory given  Plan Discussed with: CRNA  Anesthesia Plan Comments:         Anesthesia Quick Evaluation

## 2021-02-20 NOTE — Anesthesia Procedure Notes (Signed)
Arterial Line Insertion Start/End05/19/2022 8:33 PM, 02/15/2021 8:38 PM Performed by: Catalina Gravel, MD, anesthesiologist  Patient location: OR. Preanesthetic checklist: patient identified, IV checked, site marked, risks and benefits discussed, surgical consent, monitors and equipment checked, pre-op evaluation, timeout performed and anesthesia consent Lidocaine 1% used for infiltration Left, radial was placed Catheter size: 20 Fr Hand hygiene performed  and maximum sterile barriers used   Attempts: 1 Procedure performed without using ultrasound guided technique. Following insertion, dressing applied and Biopatch. Post procedure assessment: normal and unchanged  Patient tolerated the procedure well with no immediate complications.

## 2021-02-21 ENCOUNTER — Inpatient Hospital Stay (HOSPITAL_COMMUNITY): Payer: Medicare Other

## 2021-02-21 ENCOUNTER — Other Ambulatory Visit (HOSPITAL_COMMUNITY): Payer: Medicare Other

## 2021-02-21 DIAGNOSIS — J439 Emphysema, unspecified: Secondary | ICD-10-CM | POA: Diagnosis present

## 2021-02-21 DIAGNOSIS — K55029 Acute infarction of small intestine, extent unspecified: Secondary | ICD-10-CM | POA: Diagnosis present

## 2021-02-21 DIAGNOSIS — K56609 Unspecified intestinal obstruction, unspecified as to partial versus complete obstruction: Secondary | ICD-10-CM | POA: Diagnosis not present

## 2021-02-21 DIAGNOSIS — I4891 Unspecified atrial fibrillation: Secondary | ICD-10-CM | POA: Diagnosis not present

## 2021-02-21 DIAGNOSIS — N1831 Chronic kidney disease, stage 3a: Secondary | ICD-10-CM | POA: Diagnosis present

## 2021-02-21 DIAGNOSIS — Z20822 Contact with and (suspected) exposure to covid-19: Secondary | ICD-10-CM | POA: Diagnosis present

## 2021-02-21 DIAGNOSIS — Z452 Encounter for adjustment and management of vascular access device: Secondary | ICD-10-CM

## 2021-02-21 DIAGNOSIS — F0390 Unspecified dementia without behavioral disturbance: Secondary | ICD-10-CM | POA: Diagnosis present

## 2021-02-21 DIAGNOSIS — A419 Sepsis, unspecified organism: Principal | ICD-10-CM

## 2021-02-21 DIAGNOSIS — Z90710 Acquired absence of both cervix and uterus: Secondary | ICD-10-CM | POA: Diagnosis not present

## 2021-02-21 DIAGNOSIS — K4030 Unilateral inguinal hernia, with obstruction, without gangrene, not specified as recurrent: Secondary | ICD-10-CM

## 2021-02-21 DIAGNOSIS — I495 Sick sinus syndrome: Secondary | ICD-10-CM | POA: Diagnosis present

## 2021-02-21 DIAGNOSIS — E861 Hypovolemia: Secondary | ICD-10-CM

## 2021-02-21 DIAGNOSIS — E872 Acidosis: Secondary | ICD-10-CM | POA: Diagnosis present

## 2021-02-21 DIAGNOSIS — I5042 Chronic combined systolic (congestive) and diastolic (congestive) heart failure: Secondary | ICD-10-CM | POA: Diagnosis present

## 2021-02-21 DIAGNOSIS — K631 Perforation of intestine (nontraumatic): Secondary | ICD-10-CM | POA: Diagnosis present

## 2021-02-21 DIAGNOSIS — N179 Acute kidney failure, unspecified: Secondary | ICD-10-CM | POA: Diagnosis present

## 2021-02-21 DIAGNOSIS — K4040 Unilateral inguinal hernia, with gangrene, not specified as recurrent: Secondary | ICD-10-CM | POA: Diagnosis present

## 2021-02-21 DIAGNOSIS — R6521 Severe sepsis with septic shock: Secondary | ICD-10-CM | POA: Diagnosis not present

## 2021-02-21 DIAGNOSIS — D6959 Other secondary thrombocytopenia: Secondary | ICD-10-CM | POA: Diagnosis present

## 2021-02-21 DIAGNOSIS — I4821 Permanent atrial fibrillation: Secondary | ICD-10-CM | POA: Diagnosis present

## 2021-02-21 DIAGNOSIS — Z515 Encounter for palliative care: Secondary | ICD-10-CM

## 2021-02-21 DIAGNOSIS — E871 Hypo-osmolality and hyponatremia: Secondary | ICD-10-CM | POA: Diagnosis present

## 2021-02-21 DIAGNOSIS — I9589 Other hypotension: Secondary | ICD-10-CM | POA: Diagnosis not present

## 2021-02-21 DIAGNOSIS — E860 Dehydration: Secondary | ICD-10-CM | POA: Diagnosis present

## 2021-02-21 DIAGNOSIS — K658 Other peritonitis: Secondary | ICD-10-CM | POA: Diagnosis present

## 2021-02-21 DIAGNOSIS — I2510 Atherosclerotic heart disease of native coronary artery without angina pectoris: Secondary | ICD-10-CM | POA: Diagnosis present

## 2021-02-21 DIAGNOSIS — Z66 Do not resuscitate: Secondary | ICD-10-CM | POA: Diagnosis present

## 2021-02-21 DIAGNOSIS — I130 Hypertensive heart and chronic kidney disease with heart failure and stage 1 through stage 4 chronic kidney disease, or unspecified chronic kidney disease: Secondary | ICD-10-CM | POA: Diagnosis present

## 2021-02-21 DIAGNOSIS — I700 Atherosclerosis of aorta: Secondary | ICD-10-CM | POA: Diagnosis present

## 2021-02-21 LAB — COMPREHENSIVE METABOLIC PANEL
ALT: 14 U/L (ref 0–44)
AST: 23 U/L (ref 15–41)
Albumin: 2.7 g/dL — ABNORMAL LOW (ref 3.5–5.0)
Alkaline Phosphatase: 41 U/L (ref 38–126)
Anion gap: 10 (ref 5–15)
BUN: 83 mg/dL — ABNORMAL HIGH (ref 8–23)
CO2: 23 mmol/L (ref 22–32)
Calcium: 8.2 mg/dL — ABNORMAL LOW (ref 8.9–10.3)
Chloride: 97 mmol/L — ABNORMAL LOW (ref 98–111)
Creatinine, Ser: 2.06 mg/dL — ABNORMAL HIGH (ref 0.44–1.00)
GFR, Estimated: 23 mL/min — ABNORMAL LOW (ref >60)
Glucose, Bld: 97 mg/dL (ref 70–99)
Potassium: 3.5 mmol/L (ref 3.5–5.1)
Sodium: 130 mmol/L — ABNORMAL LOW (ref 135–145)
Total Bilirubin: 1.3 mg/dL — ABNORMAL HIGH (ref 0.3–1.2)
Total Protein: 4.9 g/dL — ABNORMAL LOW (ref 6.5–8.1)

## 2021-02-21 LAB — URINALYSIS, ROUTINE W REFLEX MICROSCOPIC
Bilirubin Urine: NEGATIVE
Glucose, UA: NEGATIVE mg/dL
Hgb urine dipstick: NEGATIVE
Ketones, ur: 5 mg/dL — AB
Nitrite: NEGATIVE
Protein, ur: NEGATIVE mg/dL
Specific Gravity, Urine: 1.025 (ref 1.005–1.030)
pH: 5 (ref 5.0–8.0)

## 2021-02-21 LAB — CBC WITH DIFFERENTIAL/PLATELET
Abs Immature Granulocytes: 0 10*3/uL (ref 0.00–0.07)
Basophils Absolute: 0 10*3/uL (ref 0.0–0.1)
Basophils Relative: 0 %
Eosinophils Absolute: 0 10*3/uL (ref 0.0–0.5)
Eosinophils Relative: 0 %
HCT: 42.2 % (ref 36.0–46.0)
Hemoglobin: 14.2 g/dL (ref 12.0–15.0)
Lymphocytes Relative: 6 %
Lymphs Abs: 0.4 10*3/uL — ABNORMAL LOW (ref 0.7–4.0)
MCH: 31.5 pg (ref 26.0–34.0)
MCHC: 33.6 g/dL (ref 30.0–36.0)
MCV: 93.6 fL (ref 80.0–100.0)
Monocytes Absolute: 0.3 10*3/uL (ref 0.1–1.0)
Monocytes Relative: 4 %
Neutro Abs: 6.1 10*3/uL (ref 1.7–7.7)
Neutrophils Relative %: 90 %
Platelets: 325 10*3/uL (ref 150–400)
RBC: 4.51 MIL/uL (ref 3.87–5.11)
RDW: 13 % (ref 11.5–15.5)
WBC: 6.8 10*3/uL (ref 4.0–10.5)
nRBC: 0 % (ref 0.0–0.2)
nRBC: 0 /100 WBC

## 2021-02-21 LAB — GLUCOSE, CAPILLARY
Glucose-Capillary: 82 mg/dL (ref 70–99)
Glucose-Capillary: 82 mg/dL (ref 70–99)
Glucose-Capillary: 101 mg/dL — ABNORMAL HIGH (ref 70–99)
Glucose-Capillary: 99 mg/dL (ref 70–99)

## 2021-02-21 LAB — MAGNESIUM: Magnesium: 1.8 mg/dL (ref 1.7–2.4)

## 2021-02-21 LAB — PROTIME-INR
INR: 1.3 — ABNORMAL HIGH (ref 0.8–1.2)
Prothrombin Time: 16.6 seconds — ABNORMAL HIGH (ref 11.4–15.2)

## 2021-02-21 LAB — MRSA PCR SCREENING: MRSA by PCR: NEGATIVE

## 2021-02-21 LAB — PHOSPHORUS: Phosphorus: 4.6 mg/dL (ref 2.5–4.6)

## 2021-02-21 LAB — LACTIC ACID, PLASMA: Lactic Acid, Venous: 1.2 mmol/L (ref 0.5–1.9)

## 2021-02-21 MED ORDER — HEPARIN SODIUM (PORCINE) 5000 UNIT/ML IJ SOLN
5000.0000 [IU] | Freq: Three times a day (TID) | INTRAMUSCULAR | Status: DC
  Administered 2021-02-21: 5000 [IU] via SUBCUTANEOUS
  Filled 2021-02-21: qty 1

## 2021-02-21 MED ORDER — LORAZEPAM 2 MG/ML PO CONC: 1.0000 mg | ORAL | Status: DC | PRN

## 2021-02-21 MED ORDER — AMIODARONE HCL IN DEXTROSE 360-4.14 MG/200ML-% IV SOLN
60.0000 mg/h | INTRAVENOUS | Status: DC
  Administered 2021-02-21: 60 mg/h via INTRAVENOUS
  Filled 2021-02-21: qty 400

## 2021-02-21 MED ORDER — GLYCOPYRROLATE 0.2 MG/ML IJ SOLN: 0.2000 mg | INTRAMUSCULAR | Status: DC | PRN

## 2021-02-21 MED ORDER — ALBUMIN HUMAN 5 % IV SOLN
INTRAVENOUS | Status: AC
  Filled 2021-02-21: qty 250

## 2021-02-21 MED ORDER — ONDANSETRON HCL 4 MG/2ML IJ SOLN: 4.0000 mg | Freq: Once | INTRAMUSCULAR | Status: DC | PRN

## 2021-02-21 MED ORDER — HYDROMORPHONE BOLUS VIA INFUSION
0.5000 mg | INTRAVENOUS | Status: DC | PRN
  Filled 2021-02-21: qty 1

## 2021-02-21 MED ORDER — SODIUM CHLORIDE 0.9% FLUSH
3.0000 mL | Freq: Two times a day (BID) | INTRAVENOUS | Status: DC
  Administered 2021-02-21 – 2021-02-24 (×4): 3 mL via INTRAVENOUS

## 2021-02-21 MED ORDER — ONDANSETRON HCL 4 MG/2ML IJ SOLN
4.0000 mg | Freq: Three times a day (TID) | INTRAMUSCULAR | Status: DC | PRN
Start: 2021-02-20 — End: 2021-02-25

## 2021-02-21 MED ORDER — ALBUTEROL SULFATE (2.5 MG/3ML) 0.083% IN NEBU: 2.5000 mg | INHALATION_SOLUTION | RESPIRATORY_TRACT | Status: DC | PRN

## 2021-02-21 MED ORDER — ALBUMIN HUMAN 5 % IV SOLN
INTRAVENOUS | Status: AC
  Administered 2021-02-21: 12.5 g
  Filled 2021-02-21: qty 500

## 2021-02-21 MED ORDER — ALBUTEROL SULFATE HFA 108 (90 BASE) MCG/ACT IN AERS: 2.0000 | INHALATION_SPRAY | RESPIRATORY_TRACT | Status: DC | PRN

## 2021-02-21 MED ORDER — ACETAMINOPHEN 650 MG RE SUPP
650.0000 mg | Freq: Four times a day (QID) | RECTAL | Status: DC | PRN
  Filled 2021-02-21: qty 1

## 2021-02-21 MED ORDER — MORPHINE SULFATE (PF) 2 MG/ML IV SOLN: 1.0000 mg | INTRAVENOUS | Status: DC | PRN

## 2021-02-21 MED ORDER — LACTATED RINGERS IV SOLN: INTRAVENOUS | Status: DC

## 2021-02-21 MED ORDER — PHENYLEPHRINE HCL-NACL 10-0.9 MG/250ML-% IV SOLN
0.0000 ug/min | INTRAVENOUS | Status: DC
  Administered 2021-02-21: 10 ug/min via INTRAVENOUS

## 2021-02-21 MED ORDER — ASPIRIN 81 MG PO CHEW: 81.0000 mg | CHEWABLE_TABLET | Freq: Every day | ORAL | Status: DC

## 2021-02-21 MED ORDER — BIOTENE DRY MOUTH MT LIQD: 15.0000 mL | OROMUCOSAL | Status: DC | PRN

## 2021-02-21 MED ORDER — AMIODARONE HCL IN DEXTROSE 360-4.14 MG/200ML-% IV SOLN
30.0000 mg/h | INTRAVENOUS | Status: DC
  Administered 2021-02-21: 30 mg/h via INTRAVENOUS
  Filled 2021-02-21: qty 200

## 2021-02-21 MED ORDER — AMIODARONE LOAD VIA INFUSION
150.0000 mg | Freq: Once | INTRAVENOUS | Status: AC
  Administered 2021-02-21: 150 mg via INTRAVENOUS
  Filled 2021-02-21: qty 83.34

## 2021-02-21 MED ORDER — UMECLIDINIUM BROMIDE 62.5 MCG/INH IN AEPB
1.0000 | INHALATION_SPRAY | Freq: Every day | RESPIRATORY_TRACT | Status: DC
  Filled 2021-02-21: qty 7

## 2021-02-21 MED ORDER — GLYCOPYRROLATE 1 MG PO TABS: 1.0000 mg | ORAL_TABLET | ORAL | Status: DC | PRN

## 2021-02-21 MED ORDER — HALOPERIDOL 0.5 MG PO TABS: 0.5000 mg | ORAL_TABLET | ORAL | Status: DC | PRN

## 2021-02-21 MED ORDER — CHLORHEXIDINE GLUCONATE CLOTH 2 % EX PADS
6.0000 | MEDICATED_PAD | Freq: Every day | CUTANEOUS | Status: DC
  Administered 2021-02-24: 6 via TOPICAL

## 2021-02-21 MED ORDER — LORAZEPAM 2 MG/ML IJ SOLN
0.5000 mg | INTRAMUSCULAR | Status: DC | PRN
  Administered 2021-02-21: 0.5 mg via INTRAVENOUS
  Filled 2021-02-21: qty 1

## 2021-02-21 MED ORDER — SODIUM CHLORIDE 0.9% FLUSH: 3.0000 mL | INTRAVENOUS | Status: DC | PRN

## 2021-02-21 MED ORDER — ACETAMINOPHEN 325 MG PO TABS
650.0000 mg | ORAL_TABLET | Freq: Four times a day (QID) | ORAL | Status: DC | PRN
  Administered 2021-02-25: 650 mg via ORAL

## 2021-02-21 MED ORDER — MELATONIN 3 MG PO TABS: 3.0000 mg | ORAL_TABLET | Freq: Every day | ORAL | Status: DC

## 2021-02-21 MED ORDER — BUSPIRONE HCL 10 MG PO TABS: 10.0000 mg | ORAL_TABLET | Freq: Two times a day (BID) | ORAL | Status: DC

## 2021-02-21 MED ORDER — HYDROMORPHONE HCL 1 MG/ML IJ SOLN: 0.2500 mg | INTRAMUSCULAR | Status: DC | PRN

## 2021-02-21 MED ORDER — PHENYLEPHRINE HCL-NACL 10-0.9 MG/250ML-% IV SOLN
0.0000 ug/min | INTRAVENOUS | Status: DC
  Administered 2021-02-21: 35 ug/min via INTRAVENOUS
  Administered 2021-02-21: 60 ug/min via INTRAVENOUS
  Administered 2021-02-21: 20 ug/min via INTRAVENOUS
  Filled 2021-02-21 (×2): qty 250

## 2021-02-21 MED ORDER — METOPROLOL TARTRATE 25 MG/10 ML ORAL SUSPENSION
50.0000 mg | Freq: Three times a day (TID) | ORAL | Status: DC
  Administered 2021-02-21: 50 mg
  Filled 2021-02-21 (×3): qty 20

## 2021-02-21 MED ORDER — FENTANYL CITRATE (PF) 100 MCG/2ML IJ SOLN: 25.0000 ug | INTRAMUSCULAR | Status: DC | PRN

## 2021-02-21 MED ORDER — SODIUM CHLORIDE 0.9 % IV SOLN
0.1000 mg/h | INTRAVENOUS | Status: DC
  Administered 2021-02-21: 0.1 mg/h via INTRAVENOUS
  Administered 2021-02-23: 0.2 mg/h via INTRAVENOUS
  Administered 2021-02-24: 0.4 mg/h via INTRAVENOUS
  Filled 2021-02-21 (×4): qty 2.5

## 2021-02-21 MED ORDER — PIPERACILLIN-TAZOBACTAM IN DEX 2-0.25 GM/50ML IV SOLN
2.2500 g | Freq: Three times a day (TID) | INTRAVENOUS | Status: DC
  Administered 2021-02-21 (×2): 2.25 g via INTRAVENOUS
  Filled 2021-02-21 (×3): qty 50

## 2021-02-21 MED ORDER — POLYVINYL ALCOHOL 1.4 % OP SOLN: 1.0000 [drp] | Freq: Four times a day (QID) | OPHTHALMIC | Status: DC | PRN

## 2021-02-21 MED ORDER — MEMANTINE HCL 10 MG PO TABS
10.0000 mg | ORAL_TABLET | Freq: Two times a day (BID) | ORAL | Status: DC
  Filled 2021-02-21: qty 1

## 2021-02-21 MED ORDER — GLYCOPYRROLATE 0.2 MG/ML IJ SOLN
0.2000 mg | INTRAMUSCULAR | Status: DC | PRN
Start: 2021-02-21 — End: 2021-02-25

## 2021-02-21 MED ORDER — MEMANTINE HCL 10 MG PO TABS
10.0000 mg | ORAL_TABLET | Freq: Two times a day (BID) | ORAL | Status: DC
  Filled 2021-02-21 (×3): qty 1

## 2021-02-21 MED ORDER — HALOPERIDOL LACTATE 2 MG/ML PO CONC: 0.5000 mg | ORAL | Status: DC | PRN

## 2021-02-21 MED ORDER — LORAZEPAM 1 MG PO TABS: 1.0000 mg | ORAL_TABLET | ORAL | Status: DC | PRN

## 2021-02-21 MED ORDER — POLYETHYLENE GLYCOL 3350 17 G PO PACK: 17.0000 g | PACK | Freq: Every day | ORAL | Status: DC | PRN

## 2021-02-21 MED ORDER — SODIUM CHLORIDE 0.9 % IV SOLN: 250.0000 mL | INTRAVENOUS | Status: DC | PRN

## 2021-02-21 MED ORDER — ACETAMINOPHEN 10 MG/ML IV SOLN
1000.0000 mg | Freq: Four times a day (QID) | INTRAVENOUS | Status: AC
  Administered 2021-02-21 – 2021-02-22 (×3): 1000 mg via INTRAVENOUS
  Filled 2021-02-21 (×4): qty 100

## 2021-02-21 MED ORDER — DOCUSATE SODIUM 100 MG PO CAPS: 100.0000 mg | ORAL_CAPSULE | Freq: Two times a day (BID) | ORAL | Status: DC | PRN

## 2021-02-21 MED ORDER — HALOPERIDOL LACTATE 5 MG/ML IJ SOLN: 0.5000 mg | INTRAMUSCULAR | Status: DC | PRN

## 2021-02-21 MED ORDER — INSULIN ASPART 100 UNIT/ML IJ SOLN: 0.0000 [IU] | INTRAMUSCULAR | Status: DC

## 2021-02-21 MED ORDER — LORAZEPAM 2 MG/ML IJ SOLN: 1.0000 mg | INTRAMUSCULAR | Status: DC | PRN

## 2021-02-21 MED ORDER — ONDANSETRON HCL 4 MG PO TABS: 4.0000 mg | ORAL_TABLET | Freq: Four times a day (QID) | ORAL | Status: DC | PRN

## 2021-02-21 MED ORDER — BUSPIRONE HCL 5 MG PO TABS: 10.0000 mg | ORAL_TABLET | Freq: Two times a day (BID) | ORAL | Status: DC

## 2021-02-21 MED ORDER — DILTIAZEM HCL 25 MG/5ML IV SOLN
15.0000 mg | Freq: Once | INTRAVENOUS | Status: AC
  Administered 2021-02-21: 15 mg via INTRAVENOUS
  Filled 2021-02-21: qty 5

## 2021-02-21 NOTE — Plan of Care (Signed)

## 2021-02-21 NOTE — Procedures (Signed)
Central Venous Catheter Insertion Procedure Note  Mikayla Harper  [id number]  22-Oct-1929  Date:02/21/21  Time:2:26 PM   Provider Performing:Pete Johnette Abraham Kary Kos   Procedure: Insertion of Non-tunneled Central Venous [phone]) with US guidance ([id number])   Indication(s) Medication administration and Difficult access  Consent Risks of the procedure as well as the alternatives and risks of each were explained to the patient and/or caregiver.  Consent for the procedure was obtained and is signed in the bedside chart  Anesthesia Topical only with 1% lidocaine   Timeout Verified patient identification, verified procedure, site/side was marked, verified correct patient position, special equipment/implants available, medications/allergies/relevant history reviewed, required imaging and test results available.  Sterile Technique Maximal sterile technique including full sterile barrier drape, hand hygiene, sterile gown, sterile gloves, mask, hair covering, sterile ultrasound probe cover (if used).  Procedure Description Area of catheter insertion was cleaned with chlorhexidine and draped in sterile fashion.  With real-time ultrasound guidance a central venous catheter was placed into the right internal jugular vein. Nonpulsatile blood flow and easy flushing noted in all ports.  The catheter was sutured in place and sterile dressing applied.  Complications/Tolerance None; patient tolerated the procedure well. Chest X-ray is ordered to verify placement for internal jugular or subclavian cannulation.   Chest x-ray is not ordered for femoral cannulation.  EBL Minimal  Specimen(s) None  Mikayla Harper ACNP-BC [place] Pager # [phone] OR # [phone] if no answer

## 2021-02-21 NOTE — Progress Notes (Signed)
Pt constantly trying to climb out of the bed. Pt is very anxious and disoriented. RN and NT placed reoriented patient and made her comfortable in the bed. See MAR for medication admin given for patients anxiety.

## 2021-02-21 NOTE — Progress Notes (Signed)
eLink Physician-Brief Progress Note Patient Name: Mikayla Harper DOB: May 29, 1930 MRN: [id number]   Date of Service  02/21/2021  HPI/Events of Note  Patient is s/p exploratory laparotomy with small bowel resection for incarcerated hernia with small bowel obstruction, she was hypotensive post-operatively despite volume resuscitation so she was admitted to the ICU for further Rx. She is on a Phenylephrine infusion. Patient has extensive co-morbidities.  eICU Interventions  New Patient Evaluation completed.        Kerry Kass Akiem Urieta 02/21/2021, 3:41 AM

## 2021-02-21 NOTE — Progress Notes (Signed)
NAME:  Mikayla Harper, MRN:  [id number], DOB:  [birth year], LOS: 0 ADMISSION DATE:  02/06/2021, CONSULTATION DATE:  02/18/2021 REFERRING MD:  TRH, CHIEF COMPLAINT:  Septic shock, following repair of strangulated hernia   History of Present Illness:  87 yo F with a history of mild dementia, HLD, paroxysmal Afib, sick sinus syndrome/[employer] pacemaker placement 2013, chronic combined systolic and diastolic heart failure, history of TIA, and history of TB >50 years ago who presented from [facility] this evening due to constipation, vomiting, and oliguria.  In the ED, patient was found to be tachycardic with normal blood pressure and normal sats.  She was evaluated by general surgery, found to have strangulated hernia and resulting SBO.  She was taken to surgery urgently, and underwent primary repair of strangulated right inguinal hernia, ex lap, and small bowel resection.  She was still hypotensive in the PACU and thus TRH called PCCM for admission to ICU.    She came to the ICU with MAPs 70s and phenylephrine of 30.  She is awake and alert with no complaints.  She did receive 500cc albumin and 1L LR in the PACU.   Pertinent  Medical History   Past Medical History:  Diagnosis Date  . Anemia   . Carpal tunnel syndrome of left wrist 12/20/2010   left hand  . Dementia ([place])   . Hyperlipidemia    on Mevacor  . Influenza-like illness 2009   H1N1 PCR negative  . Osteopenia   . Pacemaker - [facility]    Implant Jan 2013  . PAF (paroxysmal atrial fibrillation) ([place])    previously on propafenone  . Sick sinus syndrome ([place])   . TIA (transient ischemic attack) Dec 2012  . Tuberculosis     hx of over 50 years ago "  . Urinary tract infection     Significant Hospital Events: Including procedures, antibiotic start and stop dates in addition to other pertinent events   . OR 02/06/2021: Right groin exploration, primary repair of strangulated right inguinal hernia with high ligation of the sac,  exploratory laparotomy, small bowel resection . 5/29 Pip/tazo start . 5/29 afib rvr  Interim History / Subjective:   Postop from strangulated hernia.  Seen this morning in the ICU hypotensive systolic blood pressures in the 50s.  Patient given fluid bolus with marginal improvement.  A. fib RVR with heart rate max at 160s.  Currently on Neo-Synephrine.  Objective   Blood pressure (!) 73/40, pulse (!) 57, temperature (!) 96.8 F (36 C), temperature source Axillary, resp. rate (!) 23, height 5\' 6"  (1.676 m), weight 61.2 kg, SpO2 96 %.        Intake/Output Summary (Last 24 hours) at 02/21/2021 0751 Last data filed at 02/21/2021 0600 Gross per 24 hour  Intake 2655.86 ml  Output 2125 ml  Net 530.86 ml   Filed Weights   02/21/21 0300  Weight: 61.2 kg    Examination: General: Elderly female lying in bed asking for water does complain of abdominal pain HENT: Tracking appropriately, NG tube in place Lungs: Clear bilaterally no wheeze  cardiovascular: Regular rate rhythm, S1-S2 Abdomen: Midline incision, dressing in place, tenderness to palpation Extremities: No significant edema Neuro: Alert following commands GU: Foley in place  Labs/imaging that I havepersonally reviewed  (right click and "Reselect all SmartList Selections" daily)   CT Abdomen and Pelvis 5/28: 1. Small-bowel obstruction secondary to herniation of a segment of distal small bowel lateral to the right inguinal canal.  2. Sigmoid diverticulosis. 3. Faint scattered nodular density at the left lung base may be sequela of prior inflammatory/infectious process or represent developing pneumonia. 4. Aortic Atherosclerosis (ICD10-I70.0).  Resolved Hospital Problem list     Assessment & Plan:   87 yo F with who presented with SBO due to strangulated hernia POD0 right inguinal hernia repair, ex lap, and small bowel resection admitting to ICU following surgery for shock, likely septic.   Septic shock: following abdominal  surgery Status post strangulated hernia, SBO repair. Plan: Follow cultures continue Zosyn Remains on Neo-Synephrine Postop care by surgery services.  Hypotension, atrial fibrillation with RVR Hypovolemia Plan: IV fluid bolus Albumin 500 cc bolus Amiodarone bolus plus infusion Diltiazem 15 mg IV x1 Pressure support with Neo-Synephrine  SBO due to strangulated hernia POD0 inguinal hernia repair, ex lap, and small bowel resection - follow surgery recommendations -NG tube in place - N.p.o. per surgery service  Emphysema -Continue Spiriva and as needed albuterol  Goals of care: Patient is DNR Will need to continue discussions with family especially in the setting of progressive shocklike state this morning.   Best practice (right click and "Reselect all SmartList Selections" daily)  Diet:  NPO Pain/Anxiety/Delirium protocol (if indicated): Yes (RASS goal 0) VAP protocol (if indicated): Not indicated DVT prophylaxis: SCD GI prophylaxis: H2B Glucose control:  SSI Yes Central venous access:  N/A Arterial line:  Yes, and it is still needed Foley:  Yes, and it is still needed Mobility:  bed rest  PT consulted: N/A Last date of multidisciplinary goals of care discussion: I will call and discuss with family  Code Status:  full code Disposition: ICU  Labs   CBC: Recent Labs  Lab 02/21/2021 1653 01/27/2021 2254 02/21/21 0424  WBC 15.4*  --  6.8  NEUTROABS 12.0*  --  6.1  HGB 16.7* 15.0 14.2  HCT 49.7* 44.0 42.2  MCV 94.3  --  93.6  PLT 416*  --  027    Basic Metabolic Panel: Recent Labs  Lab 02/05/2021 1653 02/14/2021 2254 02/21/21 0106  NA 129* 129* 130*  K 4.6 3.9 3.5  CL 85*  --  97*  CO2 26  --  23  GLUCOSE 102*  --  97  BUN 97*  --  83*  CREATININE 2.82*  --  2.06*  CALCIUM 10.3  --  8.2*  MG  --   --  1.8  PHOS  --   --  4.6   GFR: Estimated Creatinine Clearance: 17.7 mL/min (A) (by C-G formula based on SCr of 2.06 mg/dL (H)). Recent Labs  Lab  01/30/2021 1653 02/21/21 0413 02/21/21 0424  WBC 15.4*  --  6.8  LATICACIDVEN 2.0* 1.2  --     Liver Function Tests: Recent Labs  Lab 02/03/2021 1653 02/21/21 0106  AST 38 23  ALT 21 14  ALKPHOS 82 41  BILITOT 1.2 1.3*  PROT 7.8 4.9*  ALBUMIN 3.7 2.7*   Recent Labs  Lab 01/29/2021 1653  LIPASE 70*   No results for input(s): AMMONIA in the last 168 hours.  ABG    Component Value Date/Time   PHART 7.432 01/27/2021 2254   PCO2ART 33.7 02/19/2021 2254   PO2ART 54 (L) 01/24/2021 2254   HCO3 22.7 02/08/2021 2254   TCO2 24 02/16/2021 2254   ACIDBASEDEF 1.0 02/03/2021 2254   O2SAT 90.0 02/15/2021 2254     Coagulation Profile: Recent Labs  Lab 02/21/21 0106  INR 1.3*    Cardiac Enzymes: No  results for input(s): CKTOTAL, CKMB, CKMBINDEX, TROPONINI in the last 168 hours.  HbA1C: No results found for: HGBA1C  CBG: Recent Labs  Lab 02/21/21 0338  GLUCAP 82    Review of Systems:   ROS negative except for HPI  Past Medical History:  She,  has a past medical history of Anemia, Carpal tunnel syndrome of left wrist (12/20/2010), Dementia ([place]), Hyperlipidemia, Influenza-like illness (2009), Osteopenia, Pacemaker - [employer], PAF (paroxysmal atrial fibrillation) ([place]), Sick sinus syndrome ([place]), TIA (transient ischemic attack) (Dec 2012), Tuberculosis, and Urinary tract infection.   Surgical History:   Past Surgical History:  Procedure Laterality Date  . ABDOMINAL HYSTERECTOMY    . BACK SURGERY  1976  . CARDIOVERSION N/A 11/13/2012   Procedure: CARDIOVERSION;  Surgeon: Peter M Martinique, MD;  Location: [facility] ENDOSCOPY;  Service: Cardiovascular;  Laterality: N/A;  . COLONOSCOPY  08/01/2012   Procedure: COLONOSCOPY;  Surgeon: Milus Banister, MD;  Location: [facility] ENDOSCOPY;  Service: Endoscopy;  Laterality: N/A;  . ESOPHAGOGASTRODUODENOSCOPY  08/01/2012   Procedure: ESOPHAGOGASTRODUODENOSCOPY (EGD);  Surgeon: Milus Banister, MD;  Location: [facility] ENDOSCOPY;  Service: Endoscopy;   Laterality: N/A;  . INSERT / REPLACE / REMOVE PACEMAKER  10/21/2011  . LUMBAR FUSION N/A   . OTHER SURGICAL HISTORY  In the late 1960's   Hysterectomy secondary to fibroids  . OTHER SURGICAL HISTORY     cataract surgery  . PERMANENT PACEMAKER INSERTION N/A 10/21/2011   Procedure: PERMANENT PACEMAKER INSERTION;  Surgeon: Deboraha Sprang, MD;  Location: [facility] CATH LAB;  Service: Cardiovascular;  Laterality: N/A;     Social History:   reports that she quit smoking about 32 years ago. Her smoking use included cigarettes. She has a 30.00 pack-year smoking history. She has never used smokeless tobacco. She reports that she does not drink alcohol and does not use drugs.   Family History:  Her family history includes COPD in her sister; Cancer in her daughter; Dementia in her mother; Emphysema in her father; Heart disease in her brother and father; Parkinson's disease in her sister.   Allergies Allergies  Allergen Reactions  . Lisinopril Other (See Comments)    Dizzy and lightheadedness     Home Medications  Prior to Admission medications   Medication Sig Start Date End Date Taking? Authorizing Provider  acetaminophen (TYLENOL) 500 MG tablet Take 500 mg by mouth in the morning and at bedtime.   Yes [provider]  albuterol (PROVENTIL HFA;VENTOLIN HFA) 108 (90 BASE) MCG/ACT inhaler Inhale 2 puffs into the lungs every 2 (two) hours as needed for wheezing.   Yes [provider]  aspirin EC 81 MG EC tablet Take 1 tablet (81 mg total) by mouth daily. 02/03/18  Yes Bhagat, Bhavinkumar, PA  busPIRone (BUSPAR) 10 MG tablet Take 10 mg by mouth 2 (two) times daily. 11/17/20  Yes [provider]  busPIRone (BUSPAR) 5 MG tablet Take 5 mg by mouth daily in the afternoon. 01/17/21  Yes [provider]  carvedilol (COREG) 3.125 MG tablet TAKE 1 TABLET BY MOUTH TWICE A DAY WITH A MEAL 05/03/18  Yes Weaver, Scott T, PA-C  cefTRIAXone (ROCEPHIN) 1 g injection Inject 1 g into the  muscle daily. 02/17/21  Yes [provider]  Ergocalciferol (VITAMIN D2) 50 MCG (2000 UT) TABS Take 1 tablet by mouth daily.   Yes [provider]  famotidine (PEPCID) 20 MG tablet Take 20 mg by mouth daily. 12/17/19  Yes [provider]  guaiFENesin ([place]) 600  MG 12 hr tablet Take 600 mg by mouth every 12 (twelve) hours.   Yes [provider]  guaiFENesin-dextromethorphan (ROBITUSSIN DM) 100-10 MG/5ML syrup Take 10 mLs by mouth every 4 (four) hours as needed for cough.   Yes [provider]  melatonin 3 MG TABS tablet Take 3 mg by mouth at bedtime.   Yes [provider]  memantine (NAMENDA) 10 MG tablet Take 10 mg by mouth 2 (two) times daily. 08/29/19  Yes [provider]  metroNIDAZOLE (FLAGYL) 500 MG tablet Take 500 mg by mouth 3 (three) times daily.   Yes [provider]  Multiple Vitamins-Minerals (WOMENS MULTIVITAMIN PLUS PO) Take 1 tablet by mouth daily.   Yes [provider]  nitroGLYCERIN (NITROSTAT) 0.4 MG SL tablet Place 1 tablet (0.4 mg total) under the tongue every 5 (five) minutes x 3 doses as needed for chest pain. 02/02/18  Yes Bhagat, Bhavinkumar, PA  Nutritional Supplements (RESOURCE 2.0 PO) Take 120 Units by mouth in the morning and at bedtime.   Yes [provider]  ondansetron (ZOFRAN) 4 MG tablet Take 4 mg by mouth every 8 (eight) hours as needed for nausea or vomiting.   Yes [provider]  polyethylene glycol (MIRALAX / GLYCOLAX) 17 g packet Take 17 g by mouth 2 (two) times daily.   Yes [provider]  sennosides-docusate sodium (SENOKOT-S) 8.6-50 MG tablet Take 1 tablet by mouth at bedtime.   Yes [provider]  Simethicone 180 MG CAPS Take 1 capsule by mouth in the morning, at noon, and at bedtime.   Yes [provider]  sodium phosphate Pediatric (FLEET) 3.5-9.5 GM/59ML enema Place 1 enema rectally once as needed for severe constipation.   Yes  [provider]  SPIRIVA HANDIHALER 18 MCG inhalation capsule Place 1 capsule into inhaler and inhale daily. 12/30/20  Yes [provider]     This patient is critically ill with multiple organ system failure; which, requires frequent high complexity decision making, assessment, support, evaluation, and titration of therapies. This was completed through the application of advanced monitoring technologies and extensive interpretation of multiple databases. During this encounter critical care time was devoted to patient care services described in this note for 66 minutes.  Stood at bedside to guide resuscitative efforts.   Garner Nash, DO [facility] 02/21/2021 7:51 AM

## 2021-02-21 NOTE — Progress Notes (Signed)
Spoke with [facility] Nurse, mental health and was informed that the pt does not have a defibrillator. Will start low dose Dilaudid gtt, per Dr. Frederic Jericho.

## 2021-02-21 NOTE — H&P (Signed)
NAME:  Mikayla Harper, MRN:  [id number], DOB:  [birth year], LOS: 0 ADMISSION DATE:  02/19/2021, CONSULTATION DATE:  02/18/2021 REFERRING MD:  TRH, CHIEF COMPLAINT:  Septic shock, following repair of strangulated hernia   History of Present Illness:  87 yo F with a history of mild dementia, HLD, paroxysmal Afib, sick sinus syndrome/[employer] pacemaker placement 2013, chronic combined systolic and diastolic heart failure, history of TIA, and history of TB >50 years ago who presented from [facility] this evening due to constipation, vomiting, and oliguria.  In the ED, patient was found to be tachycardic with normal blood pressure and normal sats.  She was evaluated by general surgery, found to have strangulated hernia and resulting SBO.  She was taken to surgery urgently, and underwent primary repair of strangulated right inguinal hernia, ex lap, and small bowel resection.  She was still hypotensive in the PACU and thus TRH called PCCM for admission to ICU.    She came to the ICU with MAPs 70s and phenylephrine of 30.  She is awake and alert with no complaints.  She did receive 500cc albumin and 1L LR in the PACU.   Pertinent  Medical History   Past Medical History:  Diagnosis Date  . Anemia   . Carpal tunnel syndrome of left wrist 12/20/2010   left hand  . Dementia ([place])   . Hyperlipidemia    on Mevacor  . Influenza-like illness 2009   H1N1 PCR negative  . Osteopenia   . Pacemaker - [facility]    Implant Jan 2013  . PAF (paroxysmal atrial fibrillation) ([place])    previously on propafenone  . Sick sinus syndrome ([place])   . TIA (transient ischemic attack) Dec 2012  . Tuberculosis     hx of over 50 years ago "  . Urinary tract infection      Significant Hospital Events: Including procedures, antibiotic start and stop dates in addition to other pertinent events   . OR 02/13/2021: Right groin exploration, primary repair of strangulated right inguinal hernia with high ligation of the sac,  exploratory laparotomy, small bowel resection . 5/29 Pip/tazo start .   Interim History / Subjective:    Objective   Blood pressure (!) 77/44, pulse (!) 110, temperature (!) 97 F (36.1 C), resp. rate (!) 21, SpO2 95 %.        Intake/Output Summary (Last 24 hours) at 02/21/2021 0112 Last data filed at 01/29/2021 2300 Gross per 24 hour  Intake 2500 ml  Output 1875 ml  Net 625 ml   There were no vitals filed for this visit.  Examination: General: alert and oriented, no distress HENT: no significant abnormalities Lungs: clear bilaterally Cardiovascular: RRR, 2+ pulses Abdomen: incision intact, soft nontender, NG in place Extremities: warm, no edema Neuro: alert and awake.  Not oriented to place. GU: foley in place  Labs/imaging that I havepersonally reviewed  (right click and "Reselect all SmartList Selections" daily)  CT Abdomen and Pelvis 5/28: 1. Small-bowel obstruction secondary to herniation of a segment of distal small bowel lateral to the right inguinal canal. 2. Sigmoid diverticulosis. 3. Faint scattered nodular density at the left lung base may be sequela of prior inflammatory/infectious process or represent developing pneumonia. 4. Aortic Atherosclerosis (ICD10-I70.0).   Resolved Hospital Problem list     Assessment & Plan:  A/P:  87 yo F with who presented with SBO due to strangulated hernia POD0 right inguinal hernia repair, ex lap, and small bowel resection admitting  to ICU following surgery for shock, likely septic.   # Septic shock: following abdominal surgery - continue zosyn - follow cultures (blood) - IV fluid resuscitation, pressors if need- currently on low dose neo - echo  # SBO due to strangulated hernia # POD0 inguinal hernia repair, ex lap, and small bowel resection - follow surgery recommendations - NPO for now, hold on heparin for chemical DVT ppx until cleared by surgery - keep NG  # Emphysema - continue spiriva and prn albuterol  (nebs)   Best practice (right click and "Reselect all SmartList Selections" daily)  Diet:  NPO Pain/Anxiety/Delirium protocol (if indicated): Yes (RASS goal 0) VAP protocol (if indicated): Not indicated DVT prophylaxis: SCD GI prophylaxis: H2B Glucose control:  SSI Yes Central venous access:  N/A Arterial line:  Yes, and it is still needed Foley:  Yes, and it is still needed Mobility:  bed rest  PT consulted: N/A Last date of multidisciplinary goals of care discussion [Verified code status with son Kenli Waldo- she is DNR, ok with intubation] Code Status:  full code Disposition: ICU  Labs   CBC: Recent Labs  Lab 02/10/2021 1653 01/28/2021 2254  WBC 15.4*  --   NEUTROABS 12.0*  --   HGB 16.7* 15.0  HCT 49.7* 44.0  MCV 94.3  --   PLT 416*  --     Basic Metabolic Panel: Recent Labs  Lab 02/01/2021 1653 02/02/2021 2254  NA 129* 129*  K 4.6 3.9  CL 85*  --   CO2 26  --   GLUCOSE 102*  --   BUN 97*  --   CREATININE 2.82*  --   CALCIUM 10.3  --    GFR: CrCl cannot be calculated (Unknown ideal weight.). Recent Labs  Lab 02/19/2021 1653  WBC 15.4*  LATICACIDVEN 2.0*    Liver Function Tests: Recent Labs  Lab 02/14/2021 1653  AST 38  ALT 21  ALKPHOS 82  BILITOT 1.2  PROT 7.8  ALBUMIN 3.7   Recent Labs  Lab 02/11/2021 1653  LIPASE 70*   No results for input(s): AMMONIA in the last 168 hours.  ABG    Component Value Date/Time   PHART 7.432 02/16/2021 2254   PCO2ART 33.7 02/03/2021 2254   PO2ART 54 (L) 02/12/2021 2254   HCO3 22.7 02/09/2021 2254   TCO2 24 01/29/2021 2254   ACIDBASEDEF 1.0 02/17/2021 2254   O2SAT 90.0 01/31/2021 2254     Coagulation Profile: No results for input(s): INR, PROTIME in the last 168 hours.  Cardiac Enzymes: No results for input(s): CKTOTAL, CKMB, CKMBINDEX, TROPONINI in the last 168 hours.  HbA1C: No results found for: HGBA1C  CBG: No results for input(s): GLUCAP in the last 168 hours.  Review of Systems:   ROS  negative except for HPI  Past Medical History:  She,  has a past medical history of Anemia, Carpal tunnel syndrome of left wrist (12/20/2010), Dementia ([place]), Hyperlipidemia, Influenza-like illness (2009), Osteopenia, Pacemaker - [employer], PAF (paroxysmal atrial fibrillation) ([place]), Sick sinus syndrome ([place]), TIA (transient ischemic attack) (Dec 2012), Tuberculosis, and Urinary tract infection.   Surgical History:   Past Surgical History:  Procedure Laterality Date  . ABDOMINAL HYSTERECTOMY    . BACK SURGERY  1976  . CARDIOVERSION N/A 11/13/2012   Procedure: CARDIOVERSION;  Surgeon: Peter M Martinique, MD;  Location[facility] ENDOSCOPY;  Service: Cardiovascular;  Laterality: N/A;  . COLONOSCOPY  08/01/2012   Procedure: COLONOSCOPY;  Surgeon: Milus Banister, MD;  Location: [facility]  ENDOSCOPY;  Service: Endoscopy;  Laterality: N/A;  . ESOPHAGOGASTRODUODENOSCOPY  08/01/2012   Procedure: ESOPHAGOGASTRODUODENOSCOPY (EGD);  Surgeon: Milus Banister, MD;  Location: [facility] ENDOSCOPY;  Service: Endoscopy;  Laterality: N/A;  . INSERT / REPLACE / REMOVE PACEMAKER  10/21/2011  . LUMBAR FUSION N/A   . OTHER SURGICAL HISTORY  In the late 1960's   Hysterectomy secondary to fibroids  . OTHER SURGICAL HISTORY     cataract surgery  . PERMANENT PACEMAKER INSERTION N/A 10/21/2011   Procedure: PERMANENT PACEMAKER INSERTION;  Surgeon: Deboraha Sprang, MD;  Location: [facility] CATH LAB;  Service: Cardiovascular;  Laterality: N/A;     Social History:   reports that she quit smoking about 32 years ago. Her smoking use included cigarettes. She has a 30.00 pack-year smoking history. She has never used smokeless tobacco. She reports that she does not drink alcohol and does not use drugs.   Family History:  Her family history includes COPD in her sister; Cancer in her daughter; Dementia in her mother; Emphysema in her father; Heart disease in her brother and father; Parkinson's disease in her sister.   Allergies Allergies  Allergen  Reactions  . Lisinopril Other (See Comments)    Dizzy and lightheadedness     Home Medications  Prior to Admission medications   Medication Sig Start Date End Date Taking? Authorizing Provider  acetaminophen (TYLENOL) 500 MG tablet Take 500 mg by mouth in the morning and at bedtime.   Yes [provider]  albuterol (PROVENTIL HFA;VENTOLIN HFA) 108 (90 BASE) MCG/ACT inhaler Inhale 2 puffs into the lungs every 2 (two) hours as needed for wheezing.   Yes [provider]  aspirin EC 81 MG EC tablet Take 1 tablet (81 mg total) by mouth daily. 02/03/18  Yes Bhagat, Bhavinkumar, PA  busPIRone (BUSPAR) 10 MG tablet Take 10 mg by mouth 2 (two) times daily. 11/17/20  Yes [provider]  busPIRone (BUSPAR) 5 MG tablet Take 5 mg by mouth daily in the afternoon. 01/17/21  Yes [provider]  carvedilol (COREG) 3.125 MG tablet TAKE 1 TABLET BY MOUTH TWICE A DAY WITH A MEAL 05/03/18  Yes Weaver, Scott T, PA-C  cefTRIAXone (ROCEPHIN) 1 g injection Inject 1 g into the muscle daily. 02/17/21  Yes [provider]  Ergocalciferol (VITAMIN D2) 50 MCG (2000 UT) TABS Take 1 tablet by mouth daily.   Yes [provider]  famotidine (PEPCID) 20 MG tablet Take 20 mg by mouth daily. 12/17/19  Yes [provider]  guaiFENesin (MUCINEX) 600 MG 12 hr tablet Take 600 mg by mouth every 12 (twelve) hours.   Yes [provider]  guaiFENesin-dextromethorphan (ROBITUSSIN DM) 100-10 MG/5ML syrup Take 10 mLs by mouth every 4 (four) hours as needed for cough.   Yes [provider]  melatonin 3 MG TABS tablet Take 3 mg by mouth at bedtime.   Yes [provider]  memantine (NAMENDA) 10 MG tablet Take 10 mg by mouth 2 (two) times daily. 08/29/19  Yes [provider]  metroNIDAZOLE (FLAGYL) 500 MG tablet Take 500 mg by mouth 3 (three) times daily.   Yes [provider]  Multiple Vitamins-Minerals (WOMENS MULTIVITAMIN PLUS PO) Take 1  tablet by mouth daily.   Yes [provider]  nitroGLYCERIN (NITROSTAT) 0.4 MG SL tablet Place 1 tablet (0.4 mg total) under the tongue every 5 (five) minutes x 3 doses as needed for chest pain. 02/02/18  Yes Bhagat, Crista Luria, PA  Nutritional Supplements (RESOURCE  2.0 PO) Take 120 Units by mouth in the morning and at bedtime.   Yes [provider]  ondansetron (ZOFRAN) 4 MG tablet Take 4 mg by mouth every 8 (eight) hours as needed for nausea or vomiting.   Yes [provider]  polyethylene glycol (MIRALAX / GLYCOLAX) 17 g packet Take 17 g by mouth 2 (two) times daily.   Yes [provider]  sennosides-docusate sodium (SENOKOT-S) 8.6-50 MG tablet Take 1 tablet by mouth at bedtime.   Yes [provider]  Simethicone 180 MG CAPS Take 1 capsule by mouth in the morning, at noon, and at bedtime.   Yes [provider]  sodium phosphate Pediatric (FLEET) 3.5-9.5 GM/59ML enema Place 1 enema rectally once as needed for severe constipation.   Yes [provider]  SPIRIVA HANDIHALER 18 MCG inhalation capsule Place 1 capsule into inhaler and inhale daily. 12/30/20  Yes [provider]     Critical care time: 55 min     This patient is critically ill with hypotension requiring pressors, which, requires frequent high complexity decision making, assessment, support, evaluation, and titration of therapies. This was completed through the application of advanced monitoring technologies and extensive interpretation of multiple databases. During this encounter critical care time was devoted to patient care services described in this note for 55 minutes.

## 2021-02-21 NOTE — Progress Notes (Signed)
Received call from ICU RN indicating urgency to the consult.   Discussed with Dr. Valeta Harms and surgery briefly.   Spoke with son Mikayla Harper.  The family will come in this afternoon for further conversation about "what ifs" and best next steps.  Florentina Jenny, PA-C Palliative Medicine Office:  [phone]

## 2021-02-21 NOTE — Progress Notes (Addendum)
PCCM:  HR improved  On Amio Completed bolus Discussed with Saverio Danker, PA from [place], DO [facility] 02/21/2021 8:32 AM   Possible peritonitis Did have some stool leakage into peritoneum Palliative care had been previously consulted.  Family does seem reasonable.  I think we can support her post-op  If there is concern for ongoing leak I think we need to establish clear goals BP better  HR better controlled    Discussed with gen sx and pallaitive care.  This patient is critically ill with multiple organ system failure; which, requires frequent high complexity decision making, assessment, support, evaluation, and titration of therapies. This was completed through the application of advanced monitoring technologies and extensive interpretation of multiple databases. During this encounter critical care time was devoted to patient care services described in this note for an additional 35 minutes.  Garner Nash, DO [facility] 02/21/2021 9:24 AM

## 2021-02-21 NOTE — Progress Notes (Signed)
1 Day Post-Op  Subjective: Patient awake and smiling and happy this morning.  Denies really having much pain.  Went into A fib RVR with hypotension this morning.  HR somewhat improved now in the 130s and BP much better after some fluid and albumin, rate control meds.  ROS: See above, otherwise other systems negative  Objective: Vital signs in last 24 hours: Temp:  [96.8 F (36 C)-98.2 F (36.8 C)] 96.8 F (36 C) (05/29 0249) Pulse Rate:  [25-148] 87 (05/29 0830) Resp:  [14-29] 24 (05/29 0830) BP: (45-128)/(37-99) 66/54 (05/29 0745) SpO2:  [69 %-98 %] 94 % (05/29 0830) Arterial Line BP: (60-126)/(38-66) 104/55 (05/29 0830) Weight:  [61.2 kg] 61.2 kg (05/29 0300)    Intake/Output from previous day: 05/28 0701 - 05/29 0700 In: 2655.9 [I.V.:1705.9; IV Piggyback:950] Out: 2125 [Urine:425; Emesis/NG output:800; Blood:100] Intake/Output this shift: Total I/O In: 280.4 [I.V.:180.4; IV Piggyback:100] Out: -   PE: Gen: NAD, pleasant Heart: tachy irregular Lungs: CTAB Abd: soft, minimally tender, few BS, NGT in place and not working upon my arrival.  It is flushed and some bilious output returned.  Midline and R groin incisions are c/d/i with loose staples and wicks in midline incision  Lab Results:  Recent Labs    02/23/2021 1653 02/07/2021 2254 02/21/21 0424  WBC 15.4*  --  6.8  HGB 16.7* 15.0 14.2  HCT 49.7* 44.0 42.2  PLT 416*  --  325   BMET Recent Labs    01/25/2021 1653 01/28/2021 2254 02/21/21 0106  NA 129* 129* 130*  K 4.6 3.9 3.5  CL 85*  --  97*  CO2 26  --  23  GLUCOSE 102*  --  97  BUN 97*  --  83*  CREATININE 2.82*  --  2.06*  CALCIUM 10.3  --  8.2*   PT/INR Recent Labs    02/21/21 0106  LABPROT 16.6*  INR 1.3*   CMP     Component Value Date/Time   NA 130 (L) 02/21/2021 0106   K 3.5 02/21/2021 0106   CL 97 (L) 02/21/2021 0106   CO2 23 02/21/2021 0106   GLUCOSE 97 02/21/2021 0106   BUN 83 (H) 02/21/2021 0106   CREATININE 2.06 (H) 02/21/2021  0106   CALCIUM 8.2 (L) 02/21/2021 0106   PROT 4.9 (L) 02/21/2021 0106   ALBUMIN 2.7 (L) 02/21/2021 0106   AST 23 02/21/2021 0106   ALT 14 02/21/2021 0106   ALKPHOS 41 02/21/2021 0106   BILITOT 1.3 (H) 02/21/2021 0106   GFRNONAA 23 (L) 02/21/2021 0106   GFRAA 47 (L) 02/04/2018 0645   Lipase     Component Value Date/Time   LIPASE 70 (H) 02/19/2021 1653       Studies/Results: CT Abdomen Pelvis Wo Contrast  Result Date: 02/03/2021 CLINICAL DATA:  87 year old female with abdominal pain. Concern for bowel obstruction. EXAM: CT ABDOMEN AND PELVIS WITHOUT CONTRAST TECHNIQUE: Multidetector CT imaging of the abdomen and pelvis was performed following the standard protocol without IV contrast. COMPARISON:  None. FINDINGS: Evaluation of this exam is limited in the absence of intravenous contrast. Lower chest: Right lung base linear and ground-glass, likely atelectasis/scarring. Faint scattered nodular density at the left lung base may be sequela of prior inflammatory/infectious process or represent developing pneumonia. Clinical correlation and follow-up with chest CT on a nonemergent/outpatient basis recommended. Partially visualized pericardial effusion measuring 8 mm in thickness. There is coronary vascular calcification and cardiac pacemaker wire. No intra-abdominal free air or free  fluid. Hepatobiliary: The liver is unremarkable. No intrahepatic biliary dilatation. The gallbladder is unremarkable. Pancreas: Unremarkable. No pancreatic ductal dilatation or surrounding inflammatory changes. Spleen: Normal in size without focal abnormality. Adrenals/Urinary Tract: The adrenal glands unremarkable. Moderate bilateral renal parenchyma atrophy. There is no hydronephrosis or nephrolithiasis on either side. The visualized ureters and urinary bladder appear unremarkable. Stomach/Bowel: There is sigmoid diverticulosis without active inflammatory changes. There is herniation of a segment of distal small bowel  lateral to the right inguinal canal. There is inflammatory changes of the herniated bowel with severe narrowing of the bowel segment in the neck of the hernia. There is diffuse dilatation of the small bowel loops proximal to the hernia. The small bowel loops measure up to 3.5 cm in caliber. No evidence of acute appendicitis. Vascular/Lymphatic: Advanced aortoiliac atherosclerotic disease. The IVC is unremarkable. No portal venous gas. There is no adenopathy. Reproductive: Hysterectomy. Other: None Musculoskeletal: Osteopenia with scoliosis and degenerative changes. No acute osseous pathology. IMPRESSION: 1. Small-bowel obstruction secondary to herniation of a segment of distal small bowel lateral to the right inguinal canal. 2. Sigmoid diverticulosis. 3. Faint scattered nodular density at the left lung base may be sequela of prior inflammatory/infectious process or represent developing pneumonia. 4. Aortic Atherosclerosis (ICD10-I70.0). Electronically Signed   By: Anner Crete M.D.   On: 01/28/2021 17:56    Anti-infectives: Anti-infectives (From admission, onward)   Start     Dose/Rate Route Frequency Ordered Stop   02/21/21 0345  piperacillin-tazobactam (ZOSYN) IVPB 2.25 g        2.25 g 100 mL/hr over 30 Minutes Intravenous Every 8 hours 02/21/21 0247     02/07/2021 2245  piperacillin-tazobactam (ZOSYN) IVPB 3.375 g  Status:  Discontinued        3.375 g 100 mL/hr over 30 Minutes Intravenous  Once 02/18/2021 2238 02/21/21 0247   02/06/2021 2000  ceFAZolin (ANCEF) IVPB 1 g/50 mL premix  Status:  Discontinued        1 g 100 mL/hr over 30 Minutes Intravenous Every 12 hours 02/16/2021 1915 02/21/21 0247       Assessment/Plan A fib RVR - per CCM, ok to clamp NGT for meds down NGT to help with this Dementia  HLD H/O TIA AKI  POD 1, s/p ex lap with SBR and repair of strangulated right inguinal hernia, Dr. Kae Heller 5/28 -maintain NGT and bowel at this time to protect staple line which may be  tenuous -ok to put med down NGT to assist with rate control, D/W CCM -agree with palliative involvement as patient pleasant and looks ok currently, but has the potential to decline and need to be prepared for that possibility in an 87 yo with dementia. -continue zosyn for 5 days post op due to contamination, WBC normal today -may mobilize from surgical standpoint, PT/OT ordered -pulm toilet and IS -appreciate CCM's assistance with this patient  FEN - NPO/NGT/IVFs VTE - heparin ID - zosyn   LOS: 0 days    Henreitta Cea , [facility] 02/21/2021, 8:49 AM Please see Amion for pager number during day hours 7:00am-4:30pm or 7:00am -11:30am on weekends

## 2021-02-21 NOTE — Progress Notes (Signed)
Pharmacy Antibiotic Note  Mikayla Harper is a 87 y.o. female admitted on 02/01/2021 with intra abdominal infection.  Pharmacy has been consulted for zosyn dosing.  Plan: Zosyn 2.25 gm IV q8 hours F/u renal function, cultures and clinical course     Temp (24hrs), Avg:97.6 F (36.4 C), Min:97 F (36.1 C), Max:98.2 F (36.8 C)  Recent Labs  Lab 02/04/2021 1653  WBC 15.4*  CREATININE 2.82*  LATICACIDVEN 2.0*    CrCl cannot be calculated (Unknown ideal weight.).    Allergies  Allergen Reactions  . Lisinopril Other (See Comments)    Dizzy and lightheadedness    Thank you for allowing pharmacy to be a part of this patient's care.  Tc Kapusta, [place] 02/21/2021 2:47 AM

## 2021-02-21 NOTE — Progress Notes (Signed)
Due to patient's heart rate 140s -150s -A.fib with RVR. [place] notified. Neo at 50mcg/min. Will continue to assess.

## 2021-02-21 NOTE — Consult Note (Signed)
Consultation Note Date: 02/21/2021   Patient Name: Mikayla Harper  DOB: May 20, 1930  MRN: [id number]  Age / Sex: 87 y.o., female  PCP: Harper, Mikayla Him, MD Referring Physician: Garner Nash, DO  Reason for Consultation: Establishing goals of care  HPI/Patient Profile: 87 y.o. female  with past medical history of dementia, atrial fibrillation, mixed systolic and diastolic heart failure, TIA, sick sinus syndrome status post pacemaker and defibrillator, tuberculosis greater than 50 years ago, who was admitted on 01/27/2021 with vomiting, constipation, abdominal pain and oliguria.  She was found to have a small bowel obstruction secondary to a strangulated hernia.  On May 28 she underwent exploratory laparoscopy with hernia repair and small bowel resection.  She became hypotensive in the PACU with her pressure in the 50s and heart rate in the 160s.  She was admitted by pulmonary critical care to the intensive care unit.  This morning the patient's vital signs indicated she was in shock.  She was placed on 2 pressors.  Her demeanor remained pleasant and happy.  Surgery and intensive care were quite concerned as the patient seems too fragile to potentially return to the operating room.  Clinical Assessment and Goals of Care:  I have reviewed medical records including EPIC notes, labs and imaging, received report from RN, assessed the patient and then met at the bedside along with her son Mikayla Harper and his wife Mikayla Harper who is an Therapist, sports at [employer], to discuss diagnosis prognosis, [place], EOL wishes, disposition and options.  I introduced Palliative Medicine as specialized medical care for people living with serious illness. It focuses on providing relief from the symptoms and stress of a serious illness. The goal is to improve quality of life for both the patient and the family.  We discussed a brief life review of  the patient and then focused on their current illness. The natural disease trajectory and expectations at EOL were discussed.  I attempted to elicit values and goals of care important to the patient.  Mikayla Harper and Mikayla Harper both felt strongly that the patient's quality of life is most important.  They do not want her to suffer through any invasive medical procedures.  They know she will have a very difficult time recovering from this surgery.  We spoke for a while preparing for the worst given her very low blood pressure despite treatment with 2 pressors.  Mikayla Harper and Mikayla Harper requested that we change goals to comfort.  Mikayla Harper mentioned that even the strong antibiotics were hard on her mother-in-law's body.  We discussed carefully turning off the patient's defibrillator and then weaning her off of pressors while simultaneously ensuring her comfort.   Mikayla Harper and Mikayla Harper explained that Mikayla Harper's husband died 4 years ago, but recently she had been talking of him and seeing him.   She had also decided that when the battery of her pace maker was dead she did not want to replace it.   Mikayla Harper and Mikayla Harper understand and accept that it is likely Mikayla Harper may  die this hospitalization.  We all agreed to reevaluate how Mikayla Harper is doing tomorrow morning and what neck steps are appropriate.  Questions and concerns were addressed.   The family was encouraged to call with questions or concerns.  PMT will continue to support holistically.  Primary Decision Maker:  NEXT OF KIN  Mikayla Harper and Mikayla Harper Setting    SUMMARY OF RECOMMENDATIONS    Deactivate defibrillator Comfort measures only. Very low dose dilaudid gtt with PRN boluses ordered along with antianxiety medications Will re-examine patient in the morning and discuss next steps with Mikayla Harper and Mikayla Harper.   Code Status/Advance Care Planning:  DNR   Symptom Management:   Per comfort orders.  Additional Recommendations (Limitations, Scope, Preferences):  Full  Comfort Care  Psycho-social/Spiritual:   Desire for further Chaplaincy support: Welcomed.  Prognosis:  Hours to days    Discharge Planning: Hospital death vs Hospice Facility.      Primary Diagnoses: Present on Admission: . SBO (small bowel obstruction) ([place]) . Atrial fibrillation with RVR ([place]) . COPD (chronic obstructive pulmonary disease) ([place]) . HTN (hypertension) . Hyperlipidemia . Aortic atherosclerosis ([place]) . Acute renal failure superimposed on stage 3a chronic kidney disease ([place]) . Lactic acidosis . Polycythemia . Dehydration . Hyponatremia . Leukocytosis . Chronic combined systolic and diastolic CHF (congestive heart failure) ([place]) . Thrombocytopenia ([place]) . Septic shock ([place])   I have reviewed the medical record, interviewed the patient and family, and examined the patient. The following aspects are pertinent.  Past Medical History:  Diagnosis Date  . Anemia   . Carpal tunnel syndrome of left wrist 12/20/2010   left hand  . Dementia ([place])   . Hyperlipidemia    on Mevacor  . Influenza-like illness 2009   H1N1 PCR negative  . Osteopenia   . Pacemaker - [facility]    Implant Jan 2013  . PAF (paroxysmal atrial fibrillation) ([place])    previously on propafenone  . Sick sinus syndrome ([place])   . TIA (transient ischemic attack) Dec 2012  . Tuberculosis     hx of over 50 years ago "  . Urinary tract infection    Social History   Socioeconomic History  . Marital status: Married    Spouse name: Not on file  . Number of children: 2  . Years of education: Not on file  . Highest education level: Not on file  Occupational History  . Occupation: retired2    Employer: RETIRED  Tobacco Use  . Smoking status: Former Smoker    Packs/day: 1.00    Years: 30.00    Pack years: 30.00    Types: Cigarettes    Quit date: 10/05/1988    Years since quitting: 32.4  . Smokeless tobacco: Never Used  Vaping Use  . Vaping Use: Never used  Substance and Sexual  Activity  . Alcohol use: No  . Drug use: No  . Sexual activity: Never  Other Topics Concern  . Not on file  Social History Narrative   Pt lives alone in 1 story home   Had 3 children / 2 are now deceased   11/15/2022 grade education   Retired.    Social Determinants of Health   Financial Resource Strain: Not on file  Food Insecurity: Not on file  Transportation Needs: Not on file  Physical Activity: Not on file  Stress: Not on file  Social Connections: Not on file   Family History  Problem Relation Age of Onset  . Dementia Mother   .  Emphysema Father   . Heart disease Father   . Parkinson's disease Sister   . Heart disease Brother   . Cancer Daughter   . COPD Sister     Allergies  Allergen Reactions  . Lisinopril Other (See Comments)    Dizzy and lightheadedness      Vital Signs: BP 94/69   Pulse (!) 42   Temp 97.6 F (36.4 C) (Oral)   Resp (!) 32   Ht '5\' 6"'  (1.676 m)   Wt 61.2 kg   SpO2 (!) 69%   BMI 21.78 kg/m  Pain Scale: CPOT   Pain Score: Asleep   SpO2: SpO2: (!) 69 % O2 Device:SpO2: (!) 69 % O2 Flow Rate: .     Palliative Assessment/Data: 20%     Time In: 4:00 Time Out: 5:00 Time Total: 60 min Visit consisted of counseling and education dealing with the complex and emotionally intense issues surrounding the need for palliative care and symptom management in the setting of serious and potentially life-threatening illness. Greater than 50%  of this time was spent counseling and coordinating care related to the above assessment and plan.  Signed by: Florentina Jenny, PA-C Palliative Medicine  Please contact Palliative Medicine Team phone at [phone] for questions and concerns.  For individual provider: See Shea Evans

## 2021-02-21 NOTE — Anesthesia Postprocedure Evaluation (Signed)
Anesthesia Post Note  Patient: Mikayla Harper  Procedure(s) Performed: Exploratory Laparotomy, Small Bowel Resection, Repair Right Inguinal Hernia (Right Abdomen)     Patient location during evaluation: PACU Anesthesia Type: General Level of consciousness: awake Pain management: pain level controlled Vital Signs Assessment: post-procedure vital signs reviewed and stable Respiratory status: spontaneous breathing, nonlabored ventilation, respiratory function stable and patient connected to nasal cannula oxygen Cardiovascular status: blood pressure returned to baseline and unstable Postop Assessment: no apparent nausea or vomiting Anesthetic complications: no   No complications documented.  Last Vitals:  Vitals:   02/21/21 0500 02/21/21 0600  BP: (!) 89/58 (!) 73/40  Pulse:    Resp: 18 (!) 23  Temp:    SpO2:      Last Pain:  Vitals:   02/21/21 0249  TempSrc: Axillary  PainSc:                  Catalina Gravel

## 2021-02-22 ENCOUNTER — Other Ambulatory Visit (HOSPITAL_COMMUNITY): Payer: Medicare Other

## 2021-02-22 ENCOUNTER — Inpatient Hospital Stay (HOSPITAL_COMMUNITY): Payer: Medicare Other

## 2021-02-22 DIAGNOSIS — I5042 Chronic combined systolic (congestive) and diastolic (congestive) heart failure: Secondary | ICD-10-CM | POA: Diagnosis not present

## 2021-02-22 DIAGNOSIS — A419 Sepsis, unspecified organism: Secondary | ICD-10-CM | POA: Diagnosis not present

## 2021-02-22 DIAGNOSIS — K56609 Unspecified intestinal obstruction, unspecified as to partial versus complete obstruction: Secondary | ICD-10-CM | POA: Diagnosis not present

## 2021-02-22 DIAGNOSIS — Z515 Encounter for palliative care: Secondary | ICD-10-CM | POA: Diagnosis not present

## 2021-02-22 LAB — ECHOCARDIOGRAM COMPLETE
Height: 66 in
Weight: 2158.74 oz

## 2021-02-22 MED ORDER — MEMANTINE HCL 10 MG PO TABS
10.0000 mg | ORAL_TABLET | Freq: Two times a day (BID) | ORAL | Status: DC
  Administered 2021-02-22 – 2021-02-24 (×4): 10 mg via ORAL
  Filled 2021-02-22 (×5): qty 1

## 2021-02-22 MED ORDER — BUSPIRONE HCL 5 MG PO TABS
10.0000 mg | ORAL_TABLET | Freq: Two times a day (BID) | ORAL | Status: DC
  Administered 2021-02-22 – 2021-02-24 (×4): 10 mg via ORAL
  Filled 2021-02-22 (×4): qty 2

## 2021-02-22 MED ORDER — METOPROLOL TARTRATE 5 MG/5ML IV SOLN: 2.5000 mg | Freq: Two times a day (BID) | INTRAVENOUS | Status: DC

## 2021-02-22 MED ORDER — MELATONIN 3 MG PO TABS
3.0000 mg | ORAL_TABLET | Freq: Every day | ORAL | Status: DC
  Administered 2021-02-22 – 2021-02-23 (×2): 3 mg via ORAL
  Filled 2021-02-22 (×2): qty 1

## 2021-02-22 MED ORDER — METOPROLOL TARTRATE 5 MG/5ML IV SOLN
2.5000 mg | Freq: Two times a day (BID) | INTRAVENOUS | Status: DC
  Administered 2021-02-23 – 2021-02-24 (×3): 2.5 mg via INTRAVENOUS
  Filled 2021-02-22 (×4): qty 5

## 2021-02-22 NOTE — Progress Notes (Signed)
Daily Progress Note   Patient Name: Mikayla Harper       Date: 02/22/2021 DOB: 1930-02-11  Age: 87 y.o. MRN#: [id number] Attending Physician: Candee Furbish, MD Primary Care Physician: Haywood Pao, MD Admit Date: 01/28/2021  Reason for Consultation/Follow-up: To discuss complex medical decision making related to patient's goals of care.  Subjective: Visited with patient at bedside.  She is resting very comfortably and did not wake to my voice or light touch.  I spoke with her DIL Otila Kluver on the phone.  I explained that her vital signs were up and down and that she is very likely slowly and peacefully slipping out of this world and on to the next.  Otila Kluver was very pleased that her MIL is peaceful and comfortable.  She said that she and Legrand Como will be up to see her as soon as they are off of work today.  We talked about the fact that Ms. Penalver may linger for a while.  Otila Kluver commented that she would not be surprised if that was the case.   Assessment: Patient peacefully sleeping.  Patient Profile/HPI:  87 y.o. female  with past medical history of dementia, atrial fibrillation, mixed systolic and diastolic heart failure, TIA, sick sinus syndrome status post pacemaker and defibrillator, tuberculosis greater than 50 years ago, who was admitted on 01/25/2021 with vomiting, constipation, abdominal pain and oliguria.  She was found to have a small bowel obstruction secondary to a strangulated hernia.  On May 28 she underwent exploratory laparoscopy with hernia repair and small bowel resection.  She became hypotensive in the PACU with her pressure in the 50s and heart rate in the 160s.  She was admitted by pulmonary critical care to the intensive care unit.  This morning the patient's vital signs  indicated she was in shock.  She was placed on 2 pressors.  Her demeanor remained pleasant and happy.  Surgery and intensive care were quite concerned as the patient seems too fragile to potentially return to the operating room.    Length of Stay: 1   Vital Signs: BP 94/69   Pulse (!) 119   Temp 98.3 F (36.8 C) (Oral)   Resp (!) 21   Ht 5\' 6"  (1.676 m)   Wt 61.2 kg   SpO2 92%   BMI 21.78  kg/m  SpO2: SpO2: 92 % O2 Device: O2 Device: Room Air O2 Flow Rate:         Palliative Assessment/Data: 10%     Palliative Care Plan    Recommendations/Plan:  Comfort measures only.   If ICU needs the bed please transfer to 6N.  Continue 0.1 mg / hour of dilaudid gtt for patient's comfort. (equivalent to 1 mg / hour morphine)  Family very much at peace.   Code Status:  DNR  Prognosis:   Hours - Days   Discharge Planning:  Anticipated Hospital Death  Care plan was discussed with ICU RN and DIL Tina.  Thank you for allowing the Palliative Medicine Team to assist in the care of this patient.  Total time spent:  25 min.     Greater than 50%  of this time was spent counseling and coordinating care related to the above assessment and plan.  Florentina Jenny, PA-C Palliative Medicine  Please contact Palliative MedicineTeam phone at [phone] for questions and concerns between 7 am - 7 pm.   Please see AMION for individual provider pager numbers.

## 2021-02-22 NOTE — Progress Notes (Signed)
2 Days Post-Op  Subjective: Patient sleeping.  Has been transitioned to full comfort care  ROS: See above, otherwise other systems negative  Objective: Vital signs in last 24 hours: Temp:  [96.3 F (35.7 C)-97.6 F (36.4 C)] 97.6 F (36.4 C) (05/29 2010) Pulse Rate:  [25-125] 119 (05/30 0000) Resp:  [21-32] 21 (05/30 0700) BP: (66-95)/(46-69) 94/69 (05/29 1600) SpO2:  [66 %-97 %] 92 % (05/30 0000) Arterial Line BP: (43-104)/(33-70) 73/70 (05/30 0600)    Intake/Output from previous day: 05/29 0701 - 05/30 0700 In: 1460.6 [I.V.:1210.3; IV Piggyback:250.3] Out: 450 [Urine:350; Emesis/NG output:100] Intake/Output this shift: No intake/output data recorded.  PE: Gen: sleeping Abd: soft, appropriately tender, few BS.  NGT removed. Midline and R groin incisions are c/d/i with loose staples and wicks in midline incision  Lab Results:  Recent Labs    02/19/2021 1653 02/07/2021 2254 02/21/21 0424  WBC 15.4*  --  6.8  HGB 16.7* 15.0 14.2  HCT 49.7* 44.0 42.2  PLT 416*  --  325   BMET Recent Labs    02/15/2021 1653 02/12/2021 2254 02/21/21 0106  NA 129* 129* 130*  K 4.6 3.9 3.5  CL 85*  --  97*  CO2 26  --  23  GLUCOSE 102*  --  97  BUN 97*  --  83*  CREATININE 2.82*  --  2.06*  CALCIUM 10.3  --  8.2*   PT/INR Recent Labs    02/21/21 0106  LABPROT 16.6*  INR 1.3*   CMP     Component Value Date/Time   NA 130 (L) 02/21/2021 0106   K 3.5 02/21/2021 0106   CL 97 (L) 02/21/2021 0106   CO2 23 02/21/2021 0106   GLUCOSE 97 02/21/2021 0106   BUN 83 (H) 02/21/2021 0106   CREATININE 2.06 (H) 02/21/2021 0106   CALCIUM 8.2 (L) 02/21/2021 0106   PROT 4.9 (L) 02/21/2021 0106   ALBUMIN 2.7 (L) 02/21/2021 0106   AST 23 02/21/2021 0106   ALT 14 02/21/2021 0106   ALKPHOS 41 02/21/2021 0106   BILITOT 1.3 (H) 02/21/2021 0106   GFRNONAA 23 (L) 02/21/2021 0106   GFRAA 47 (L) 02/04/2018 0645   Lipase     Component Value Date/Time   LIPASE 70 (H) 02/07/2021 1653        Studies/Results: CT Abdomen Pelvis Wo Contrast  Result Date: 02/17/2021 CLINICAL DATA:  87 year old female with abdominal pain. Concern for bowel obstruction. EXAM: CT ABDOMEN AND PELVIS WITHOUT CONTRAST TECHNIQUE: Multidetector CT imaging of the abdomen and pelvis was performed following the standard protocol without IV contrast. COMPARISON:  None. FINDINGS: Evaluation of this exam is limited in the absence of intravenous contrast. Lower chest: Right lung base linear and ground-glass, likely atelectasis/scarring. Faint scattered nodular density at the left lung base may be sequela of prior inflammatory/infectious process or represent developing pneumonia. Clinical correlation and follow-up with chest CT on a nonemergent/outpatient basis recommended. Partially visualized pericardial effusion measuring 8 mm in thickness. There is coronary vascular calcification and cardiac pacemaker wire. No intra-abdominal free air or free fluid. Hepatobiliary: The liver is unremarkable. No intrahepatic biliary dilatation. The gallbladder is unremarkable. Pancreas: Unremarkable. No pancreatic ductal dilatation or surrounding inflammatory changes. Spleen: Normal in size without focal abnormality. Adrenals/Urinary Tract: The adrenal glands unremarkable. Moderate bilateral renal parenchyma atrophy. There is no hydronephrosis or nephrolithiasis on either side. The visualized ureters and urinary bladder appear unremarkable. Stomach/Bowel: There is sigmoid diverticulosis without active inflammatory changes. There is herniation  of a segment of distal small bowel lateral to the right inguinal canal. There is inflammatory changes of the herniated bowel with severe narrowing of the bowel segment in the neck of the hernia. There is diffuse dilatation of the small bowel loops proximal to the hernia. The small bowel loops measure up to 3.5 cm in caliber. No evidence of acute appendicitis. Vascular/Lymphatic: Advanced aortoiliac  atherosclerotic disease. The IVC is unremarkable. No portal venous gas. There is no adenopathy. Reproductive: Hysterectomy. Other: None Musculoskeletal: Osteopenia with scoliosis and degenerative changes. No acute osseous pathology. IMPRESSION: 1. Small-bowel obstruction secondary to herniation of a segment of distal small bowel lateral to the right inguinal canal. 2. Sigmoid diverticulosis. 3. Faint scattered nodular density at the left lung base may be sequela of prior inflammatory/infectious process or represent developing pneumonia. 4. Aortic Atherosclerosis (ICD10-I70.0). Electronically Signed   By: Anner Crete M.D.   On: 01/30/2021 17:56   DG CHEST PORT 1 VIEW  Result Date: 02/21/2021 CLINICAL DATA:  Central line placement. EXAM: PORTABLE CHEST 1 VIEW COMPARISON:  Jan 29, 2018 FINDINGS: The cardiomediastinal silhouette is unchanged in contour.RIGHT IJ CVC tip terminates over the superior cavoatrial junction. LEFT chest cardiac pacing device. Atherosclerotic calcifications of the aorta. The enteric tube courses through the chest to the abdomen beyond the field-of-view. No pleural effusion. No pneumothorax. Coarsely calcified RIGHT upper lobe granuloma. Scattered bibasilar atelectasis. Min no acute pleuroparenchymal abnormality. Visualized abdomen is unremarkable. Multilevel degenerative changes of the thoracic spine. IMPRESSION: RIGHT IJ CVC tip terminates over the superior cavoatrial junction. Electronically Signed   By: Valentino Saxon MD   On: 02/21/2021 15:16    Anti-infectives: Anti-infectives (From admission, onward)   Start     Dose/Rate Route Frequency Ordered Stop   02/21/21 0345  piperacillin-tazobactam (ZOSYN) IVPB 2.25 g  Status:  Discontinued        2.25 g 100 mL/hr over 30 Minutes Intravenous Every 8 hours 02/21/21 0247 02/21/21 1626   01/31/2021 2245  piperacillin-tazobactam (ZOSYN) IVPB 3.375 g  Status:  Discontinued        3.375 g 100 mL/hr over 30 Minutes Intravenous  Once  02/09/2021 2238 02/21/21 0247   02/09/2021 2000  ceFAZolin (ANCEF) IVPB 1 g/50 mL premix  Status:  Discontinued        1 g 100 mL/hr over 30 Minutes Intravenous Every 12 hours 02/23/2021 1915 02/21/21 0247       Assessment/Plan A fib RVR - per CCM, ok to clamp NGT for meds down NGT to help with this Dementia  HLD H/O TIA AKI  POD 2, s/p ex lap with SBR and repair of strangulated right inguinal hernia, Dr. Kae Heller 5/28 -all abx therapy stopped, NGT removed, patient on full comfort care -appears comfortable today -will follow  FEN - NPO/IVFs VTE - stopped ID - [email or web address], stopped   LOS: 1 day    Henreitta Cea , [facility] 02/22/2021, 7:28 AM Please see Amion for pager number during day hours 7:00am-4:30pm or 7:00am -11:30am on weekends

## 2021-02-22 NOTE — Progress Notes (Signed)
Mikayla Harper. Aoun is a 87 y.o. female patient admitted. Awake, alert - oriented  X 4 - no acute distress noted.  VSS - Blood pressure 104/84, pulse (!) 140, temperature 98.1 F (36.7 C), temperature source Axillary, resp. rate (!) 24, height 5\' 6"  (1.676 m), weight 61.2 kg, SpO2 92 %.    IV in place, occlusive dsg intact without redness.  Orientation to room, and floor completed.  Armband ID verified with patient/family, and in place.   SR up x 2, fall assessment complete, with patient and family able to verbalize understanding of risk associated with falls, and verbalized understanding to call nsg before up out of bed.  Call light within reach, patient able to voice, and demonstrate understanding. No evidence of skin break down noted on exam.       Will cont to eval and treat per MD orders.  Gordy Savers, RN 02/22/2021 4:48 PM

## 2021-02-22 NOTE — Progress Notes (Addendum)
NAME:  Mikayla Harper, MRN:  [id number], DOB:  03-27-1930, LOS: 1 ADMISSION DATE:  02/01/2021, CONSULTATION DATE:  02/18/2021 REFERRING MD:  TRH, CHIEF COMPLAINT:  Septic shock, following repair of strangulated hernia   History of Present Illness:  87 yo F with a history of mild dementia, HLD, paroxysmal Afib, sick sinus syndrome/[employer]. [employer] pacemaker placement 2013, chronic combined systolic and diastolic heart failure, history of TIA, and history of TB >50 years ago who presented from [facility] this evening due to constipation, vomiting, and oliguria.  In the ED, patient was found to be tachycardic with normal blood pressure and normal sats.  She was evaluated by general surgery, found to have strangulated hernia and resulting SBO.  She was taken to surgery urgently, and underwent primary repair of strangulated right inguinal hernia, ex lap, and small bowel resection.  She was still hypotensive in the PACU and thus TRH called PCCM for admission to ICU.    She came to the ICU with MAPs 70s and phenylephrine of 30.  She is awake and alert with no complaints.  She did receive 500cc albumin and 1L LR in the PACU.   Pertinent  Medical History   Past Medical History:  Diagnosis Date  . Anemia   . Carpal tunnel syndrome of left wrist 12/20/2010   left hand  . Dementia ([place])   . Hyperlipidemia    on Mevacor  . Influenza-like illness 2009   H1N1 PCR negative  . Osteopenia   . Pacemaker - [facility]    Implant Jan 2013  . PAF (paroxysmal atrial fibrillation) ([place])    previously on propafenone  . Sick sinus syndrome ([place])   . TIA (transient ischemic attack) Dec 2012  . Tuberculosis     hx of over 50 years ago "  . Urinary tract infection     Significant Hospital Events: Including procedures, antibiotic start and stop dates in addition to other pertinent events   . OR 01/25/2021: Right groin exploration, primary repair of strangulated right inguinal hernia with high ligation of the sac,  exploratory laparotomy, small bowel resection . 5/29 Pip/tazo start . 5/29 afib rvr  Interim History / Subjective:   Transitioned to comfort care.  Objective   Blood pressure 94/69, pulse (!) 119, temperature 98.3 F (36.8 C), temperature source Oral, resp. rate (!) 21, height 5\' 6"  (1.676 m), weight 61.2 kg, SpO2 92 %.        Intake/Output Summary (Last 24 hours) at 02/22/2021 1034 Last data filed at 02/22/2021 0500 Gross per 24 hour  Intake 1081.13 ml  Output 450 ml  Net 631.13 ml   Filed Weights   02/21/21 0300  Weight: 61.2 kg    Examination: Resting comfortably on dilaudid drip Lungs clear Heart sounds regular Moving all 4 ext purposefully   Labs/imaging that I havepersonally reviewed  (right click and "Reselect all SmartList Selections" daily)   CT Abdomen and Pelvis 5/28: 1. Small-bowel obstruction secondary to herniation of a segment of distal small bowel lateral to the right inguinal canal. 2. Sigmoid diverticulosis. 3. Faint scattered nodular density at the left lung base may be sequela of prior inflammatory/infectious process or represent developing pneumonia. 4. Aortic Atherosclerosis (ICD10-I70.0).  Resolved Hospital Problem list     Assessment & Plan:   87 yo F with who presented with SBO due to strangulated hernia with postop course complicated by profound septic shock now transitioned to comfort measures. - Continue dilaudid gtt titrated to comfort with  PRN haldol - Okay to go to med/surg, left VM with Otila Kluver (son's phone went to VM as well) - Appreciate TRH taking over starting 5/31  Erskine Emery MD PCCM

## 2021-02-23 ENCOUNTER — Encounter (HOSPITAL_COMMUNITY): Payer: Self-pay | Admitting: Surgery

## 2021-02-23 DIAGNOSIS — Z515 Encounter for palliative care: Secondary | ICD-10-CM | POA: Diagnosis not present

## 2021-02-23 DIAGNOSIS — K56609 Unspecified intestinal obstruction, unspecified as to partial versus complete obstruction: Secondary | ICD-10-CM | POA: Diagnosis not present

## 2021-02-23 LAB — HEMOGLOBIN A1C
Hgb A1c MFr Bld: 5.6 % (ref 4.8–5.6)
Mean Plasma Glucose: 114 mg/dL

## 2021-02-23 NOTE — Progress Notes (Signed)
Palliative:  HPI: 87 y.o. female  with past medical history of dementia, atrial fibrillation, mixed systolic and diastolic heart failure, TIA, sick sinus syndrome status post pacemaker and defibrillator, tuberculosis greater than 50 years ago, who was admitted on 02/02/2021 with vomiting, constipation, abdominal pain and oliguria.  She was found to have a small bowel obstruction secondary to a strangulated hernia.  On May 28 she underwent exploratory laparoscopy with hernia repair and small bowel resection.  She became hypotensive in the PACU with her pressure in the 50s and heart rate in the 160s.  She was admitted by pulmonary critical care to the intensive care unit.  This morning the patient's vital signs indicated she was in shock.  She was placed on 2 pressors.  Her demeanor remained pleasant and happy.  Surgery and intensive care were quite concerned as the patient seems too fragile to potentially return to the operating room.  I met today with Ms. Mikayla Harper. She was resting comfortably. She does awaken and was able to speak with me and express her needs. She is in good spirits and denies any distress or discomfort/pain. She is drinking some fluids. On very low dose dilaudid infusion. No family at bedside. Will see how she progresses overnight but there may be a window to get her to hospice facility. We may also be seeing a rally at end of life. I will follow up tomorrow to reassess symptoms to ensure comfort and touch base with family.   Exam: Resting comfortably. No distress. Breathing regular, unlabored. Abd flat.   Plan: - No changes to plan of care. Continue dilaudid infusion. Will reassess tomorrow progress to determine hospital death vs hospice options.   15 min  Vinie Sill, NP Palliative Medicine Team Pager [phone] (Please see [email or web address] for schedule) Team Phone [phone]    Greater than 50%  of this time was spent counseling and coordinating care related to the above assessment  and plan

## 2021-02-23 NOTE — Progress Notes (Signed)
Progress Note  3 Days Post-Op  Subjective: Patient alert and sitting up in bed. Enjoying CLD tray this AM. She denies abdominal pain.   Objective: Vital signs in last 24 hours: Temp:  [97.9 F (36.6 C)-99.6 F (37.6 C)] 97.9 F (36.6 C) (05/31 0419) Pulse Rate:  [93-140] 93 (05/31 0419) Resp:  [16-24] 16 (05/31 0419) BP: (89-124)/(56-93) 124/93 (05/31 0419) SpO2:  [89 %-92 %] 91 % (05/31 0419)    Intake/Output from previous day: 05/30 0701 - 05/31 0700 In: 60 [P.O.:60] Out: 451 [Urine:450; Stool:1] Intake/Output this shift: No intake/output data recorded.  PE: General: pleasant, WD, elderly female who is laying in bed in NAD Heart: regular, rate, and rhythm.  Lungs:  Respiratory effort nonlabored Abd: soft, NT, ND, +BS, incisions c/d/i with staples present and dry dressing over Psych: Alert but not oriented     Lab Results:  Recent Labs    02/19/2021 1653 02/13/2021 2254 02/21/21 0424  WBC 15.4*  --  6.8  HGB 16.7* 15.0 14.2  HCT 49.7* 44.0 42.2  PLT 416*  --  325   BMET Recent Labs    02/01/2021 1653 02/04/2021 2254 02/21/21 0106  NA 129* 129* 130*  K 4.6 3.9 3.5  CL 85*  --  97*  CO2 26  --  23  GLUCOSE 102*  --  97  BUN 97*  --  83*  CREATININE 2.82*  --  2.06*  CALCIUM 10.3  --  8.2*   PT/INR Recent Labs    02/21/21 0106  LABPROT 16.6*  INR 1.3*   CMP     Component Value Date/Time   NA 130 (L) 02/21/2021 0106   K 3.5 02/21/2021 0106   CL 97 (L) 02/21/2021 0106   CO2 23 02/21/2021 0106   GLUCOSE 97 02/21/2021 0106   BUN 83 (H) 02/21/2021 0106   CREATININE 2.06 (H) 02/21/2021 0106   CALCIUM 8.2 (L) 02/21/2021 0106   PROT 4.9 (L) 02/21/2021 0106   ALBUMIN 2.7 (L) 02/21/2021 0106   AST 23 02/21/2021 0106   ALT 14 02/21/2021 0106   ALKPHOS 41 02/21/2021 0106   BILITOT 1.3 (H) 02/21/2021 0106   GFRNONAA 23 (L) 02/21/2021 0106   GFRAA 47 (L) 02/04/2018 0645   Lipase     Component Value Date/Time   LIPASE 70 (H) 02/07/2021 1653        Studies/Results: DG CHEST PORT 1 VIEW  Result Date: 02/21/2021 CLINICAL DATA:  Central line placement. EXAM: PORTABLE CHEST 1 VIEW COMPARISON:  Jan 29, 2018 FINDINGS: The cardiomediastinal silhouette is unchanged in contour.RIGHT IJ CVC tip terminates over the superior cavoatrial junction. LEFT chest cardiac pacing device. Atherosclerotic calcifications of the aorta. The enteric tube courses through the chest to the abdomen beyond the field-of-view. No pleural effusion. No pneumothorax. Coarsely calcified RIGHT upper lobe granuloma. Scattered bibasilar atelectasis. Min no acute pleuroparenchymal abnormality. Visualized abdomen is unremarkable. Multilevel degenerative changes of the thoracic spine. IMPRESSION: RIGHT IJ CVC tip terminates over the superior cavoatrial junction. Electronically Signed   By: Valentino Saxon MD   On: 02/21/2021 15:16    Anti-infectives: Anti-infectives (From admission, onward)   Start     Dose/Rate Route Frequency Ordered Stop   02/21/21 0345  piperacillin-tazobactam (ZOSYN) IVPB 2.25 g  Status:  Discontinued        2.25 g 100 mL/hr over 30 Minutes Intravenous Every 8 hours 02/21/21 0247 02/21/21 1626   01/24/2021 2245  piperacillin-tazobactam (ZOSYN) IVPB 3.375 g  Status:  Discontinued        3.375 g 100 mL/hr over 30 Minutes Intravenous  Once 02/09/2021 2238 02/21/21 0247   02/05/2021 2000  ceFAZolin (ANCEF) IVPB 1 g/50 mL premix  Status:  Discontinued        1 g 100 mL/hr over 30 Minutes Intravenous Every 12 hours 01/28/2021 1915 02/21/21 0247       Assessment/Plan A fib RVR - per CCM, ok to clamp NGT for meds down NGT to help with this Dementia  HLD H/O TIA AKI  POD 3, s/p ex lap with SBR and repair of strangulated right inguinal hernia, Dr. Kae Heller 5/28 - all abx therapy stopped, NGT removed, patient on full comfort care - appears comfortable today - ok to have diet as tolerated - wicks were removed from incisions - cover with clean dry dressing  daily, ok to remove staples between POD10-14 if patient remains admitted  - will be available as needed but will follow peripherally given transition to comfort care   FEN - FLD VTE - stopped ID - [email or web address], stopped   LOS: 2 days    Norm Parcel, [facility] 02/23/2021, 10:05 AM Please see Amion for pager number during day hours 7:00am-4:30pm

## 2021-02-23 NOTE — Progress Notes (Signed)
CC: Constipation, oliguria, emesis  Brief narrative Patient presented due to constipation, vomiting, and oliguria.  In the ED, patient was found to be tachycardic with normal blood pressure and normal sats.  She was evaluated by general surgery, found to have strangulated hernia and resulting SBO.  She was taken to surgery urgently, and underwent primary repair of strangulated right inguinal hernia, ex lap, and small bowel resection. She became hypotensive in PACU requiring ICU admission for vasopressor support. After continued goals of care discussions, decision was made to transition to full comfort measures.  Subjective: Some increased secretions that improved with Robinul this morning.  BP (!) 201/71 (BP Location: Right Arm)   Pulse (!) 119   Temp 100.3 F (37.9 C)   Resp 19   Ht 5\' 2"  (1.575 m)   Wt 54.4 kg   SpO2 93%   BMI 21.95 kg/m   Physical Exam:  General exam: Appears calm and comfortable Respiratory system: Clear to auscultation. Respiratory effort normal. Cardiovascular system: S1 & S2 heard, irregular rhythm with fast rate. No murmurs, rubs, gallops or clicks. Gastrointestinal system: Abdomen is distended, soft and tender. Normal bowel sounds heard. Central nervous system: Alert and oriented. No focal neurological deficits. Musculoskeletal: No edema. No calf tenderness Skin: No cyanosis. No rashes Psychiatry: Judgement and insight appear normal. Mood & affect appropriate.   Assessment/plan:  Principal Problem:   SBO (small bowel obstruction) (HCC) Active Problems:   Hyperlipidemia   HTN (hypertension)   COPD (chronic obstructive pulmonary disease) (HCC)   Chronic combined systolic and diastolic CHF (congestive heart failure) (HCC)   Atrial fibrillation with RVR (HCC)   Aortic atherosclerosis (HCC)   Acute renal failure superimposed on stage 3a chronic kidney disease (HCC)   Lactic acidosis   Polycythemia   Dehydration   Hyponatremia   Leukocytosis    Thrombocytopenia ([place])   Septic shock ([place])   Encounter for infusaport central venous catheter insertion  Goals of care Palliative care consulted. Patient now on dilaudid drip for comfort.  -Continue dilaudid drip -Continue robinul, haldol, ativan prn  SBO Strangulated hernia Patient is s/p exploratory laparotomy with small bowel resection/inguinal repair.   Atrial fibrillation with RVR -Continue Lopressor IV  Disposition: in-hospital death anticipated

## 2021-02-24 DIAGNOSIS — K56609 Unspecified intestinal obstruction, unspecified as to partial versus complete obstruction: Secondary | ICD-10-CM | POA: Diagnosis not present

## 2021-02-24 DIAGNOSIS — Z515 Encounter for palliative care: Secondary | ICD-10-CM | POA: Diagnosis not present

## 2021-02-24 LAB — SURGICAL PATHOLOGY

## 2021-02-24 MED ORDER — HYDROMORPHONE BOLUS VIA INFUSION
0.5000 mg | INTRAVENOUS | Status: DC | PRN
  Administered 2021-02-24: 0.5 mg via INTRAVENOUS
  Filled 2021-02-24: qty 1

## 2021-02-24 NOTE — Care Management Important Message (Signed)
Important Message  Patient Details  Name: Mikayla Harper MRN: [id number] Date of Birth: June 20, 1930   Medicare Important Message Given:  Yes   Patient is at the end of life out of respect no IM given.  Larysa Pall 02/24/2021, 2:56 PM

## 2021-02-24 NOTE — Progress Notes (Signed)
PROGRESS NOTE   Mikayla Harper  MHD:[id number]    DOB: 02/29/1930    DOA: 01/24/2021  PCP: Haywood Pao, MD   I have briefly reviewed patients previous medical records in [facility].    Brief Narrative:  87 yo F with a history of mild dementia, HLD, paroxysmal Afib, sick sinus syndrome/[employer] pacemaker placement 2013, chronic combined systolic and diastolic heart failure, history of TIA, and history of TB >50 years ago who presented from [facility] this evening due to constipation, vomiting, and oliguria.  In the ED, patient was found to be tachycardic with normal blood pressure and normal sats.  She was evaluated by general surgery, found to have strangulated hernia and resulting SBO.  She was taken to surgery urgently, and underwent primary repair of strangulated right inguinal hernia, ex lap, and small bowel resection.  Postop course was complicated by profound septic shock requiring ICU admission.  Eventually transitioned to full comfort care.  Palliative care following.    Assessment & Plan:  Principal Problem:   SBO (small bowel obstruction) (HCC) Active Problems:   Hyperlipidemia   HTN (hypertension)   COPD (chronic obstructive pulmonary disease) (HCC)   Chronic combined systolic and diastolic CHF (congestive heart failure) (HCC)   Atrial fibrillation with RVR (HCC)   Aortic atherosclerosis (HCC)   Acute renal failure superimposed on stage 3a chronic kidney disease (HCC)   Lactic acidosis   Polycythemia   Dehydration   Hyponatremia   Leukocytosis   Thrombocytopenia ([place])   Septic shock (HCC)   Encounter for infusaport central venous catheter insertion   SBO due to strangulated hernia, s/p exploratory laparotomy with small bowel resection and repair of strangulated right inguinal hernia 2/11: Complicated postop course including septic shock.  Now transitioned to full comfort care.  Per general surgery signed off 5/31, okay to have diet as tolerated, surgical  site to be covered with dry dressing daily and okay to remove staples between postop day 10-14.  Septic shock: Resolved.  A. fib with RVR: Continue scheduled IV Lopressor to avoid RVR related discomfort.  Could consider transitioning to oral metoprolol.  AKI: No longer checking labs.  Last known creatinine was 2.06 on 5/29.  Goals of care: Currently on full comfort care.  Remains on Dilaudid drip.  Tolerating some liquids.  Overall seems to have improved some.  Palliative team following and await their reassessment regarding discharge disposition i.e. hospice options versus Hospital death-does not seem to be imminent at this time.   Body mass index is 21.78 kg/m.   DVT prophylaxis:   None due to full comfort care status.   Code Status: DNR Family Communication: None at bedside. Disposition:  Status is: Inpatient  Remains inpatient appropriate because:IV treatments appropriate due to intensity of illness or inability to take PO   Dispo: The patient is from: SNF              Anticipated d/c is to: TBD              Patient currently is not medically stable to d/c.   Difficult to place patient No        Consultants:   PCCM General surgery Palliative care medicine  Procedures:   As noted above  Antimicrobials:    Anti-infectives (From admission, onward)   Start     Dose/Rate Route Frequency Ordered Stop   02/21/21 0345  piperacillin-tazobactam (ZOSYN) IVPB 2.25 g  Status:  Discontinued  2.25 g 100 mL/hr over 30 Minutes Intravenous Every 8 hours 02/21/21 0247 02/21/21 1626   02/10/2021 2245  piperacillin-tazobactam (ZOSYN) IVPB 3.375 g  Status:  Discontinued        3.375 g 100 mL/hr over 30 Minutes Intravenous  Once 02/04/2021 2238 02/21/21 0247   01/26/2021 2000  ceFAZolin (ANCEF) IVPB 1 g/50 mL premix  Status:  Discontinued        1 g 100 mL/hr over 30 Minutes Intravenous Every 12 hours 02/12/2021 1915 02/21/21 0247        Subjective:  Asking for water to drink.   Seemed somewhat confused.  Unable to get adequate history.  Objective:   Vitals:   02/22/21 1500 02/22/21 2158 02/23/21 0419 02/24/21 0553  BP: 104/84 (!) 89/56 (!) 124/93 120/71  Pulse: (!) 140 (!) 122 93 93  Resp: (!) 24 (!) 22 16 19   Temp: 98.1 F (36.7 C) 99.6 F (37.6 C) 97.9 F (36.6 C) 98.2 F (36.8 C)  TempSrc: Axillary Oral Oral Oral  SpO2: 92% (!) 89% 91% (!) 87%  Weight:      Height:        General exam: Elderly female, moderately built, frail and ill looking chronically, lying comfortably propped up in bed without distress.  Has right IJ central line. Respiratory system: Clear to auscultation. Respiratory effort normal. Cardiovascular system: S1 & S2 heard, RRR. No JVD, murmurs, rubs, gallops or clicks. No pedal edema. Gastrointestinal system: Abdomen is minimally distended, diffuse mild tenderness without rigidity, guarding or rebound.  No organomegaly or masses appreciated.  Normal bowel sounds heard. GU: Foley catheter in place. Central nervous system: Alert and oriented only to self. No focal neurological deficits. Extremities: Symmetric 5 x 5 power. Skin: No rashes, lesions or ulcers Psychiatry: Judgement and insight appear impaired. Mood & affect appropriate.     Data Reviewed:   I have personally reviewed following labs and imaging studies   CBC: Recent Labs  Lab 02/15/2021 1653 02/21/2021 2254 02/21/21 0424  WBC 15.4*  --  6.8  NEUTROABS 12.0*  --  6.1  HGB 16.7* 15.0 14.2  HCT 49.7* 44.0 42.2  MCV 94.3  --  93.6  PLT 416*  --  591    Basic Metabolic Panel: Recent Labs  Lab 02/06/2021 1653 02/06/2021 2254 02/21/21 0106  NA 129* 129* 130*  K 4.6 3.9 3.5  CL 85*  --  97*  CO2 26  --  23  GLUCOSE 102*  --  97  BUN 97*  --  83*  CREATININE 2.82*  --  2.06*  CALCIUM 10.3  --  8.2*  MG  --   --  1.8  PHOS  --   --  4.6    Liver Function Tests: Recent Labs  Lab 02/14/2021 1653 02/21/21 0106  AST 38 23  ALT 21 14  ALKPHOS 82 41  BILITOT  1.2 1.3*  PROT 7.8 4.9*  ALBUMIN 3.7 2.7*    CBG: Recent Labs  Lab 02/21/21 0754 02/21/21 1158 02/21/21 1602  GLUCAP 82 101* 99    Microbiology Studies:   Recent Results (from the past 240 hour(s))  Resp Panel by RT-PCR (Flu A&B, Covid) Nasopharyngeal Swab     Status: None   Collection Time: 02/01/2021  5:39 PM   Specimen: Nasopharyngeal Swab; Nasopharyngeal(NP) swabs in vial transport medium  Result Value Ref Range Status   SARS Coronavirus 2 by RT PCR NEGATIVE NEGATIVE Final    Comment: (NOTE) SARS-CoV-2 target nucleic  acids are NOT DETECTED.  The SARS-CoV-2 RNA is generally detectable in upper respiratory specimens during the acute phase of infection. The lowest concentration of SARS-CoV-2 viral copies this assay can detect is 138 copies/mL. A negative result does not preclude SARS-Cov-2 infection and should not be used as the sole basis for treatment or other patient management decisions. A negative result may occur with  improper specimen collection/handling, submission of specimen other than nasopharyngeal swab, presence of viral mutation(s) within the areas targeted by this assay, and inadequate number of viral copies(<138 copies/mL). A negative result must be combined with clinical observations, patient history, and epidemiological information. The expected result is Negative.  Fact Sheet for Patients:  EntrepreneurPulse.com.au  Fact Sheet for Healthcare Providers:  IncredibleEmployment.be  This test is no t yet approved or cleared by the [place] FDA and  has been authorized for detection and/or diagnosis of SARS-CoV-2 by FDA under an Emergency Use Authorization (EUA). This EUA will remain  in effect (meaning this test can be used) for the duration of the COVID-19 declaration under Section 564(b)(1) of the Act, 21 U.S.C.section 360bbb-3(b)(1), unless the authorization is terminated  or revoked sooner.       Influenza  A by PCR NEGATIVE NEGATIVE Final   Influenza B by PCR NEGATIVE NEGATIVE Final    Comment: (NOTE) The Xpert Xpress SARS-CoV-2/FLU/RSV plus assay is intended as an aid in the diagnosis of influenza from Nasopharyngeal swab specimens and should not be used as a sole basis for treatment. Nasal washings and aspirates are unacceptable for Xpert Xpress SARS-CoV-2/FLU/RSV testing.  Fact Sheet for Patients: EntrepreneurPulse.com.au  Fact Sheet for Healthcare Providers: IncredibleEmployment.be  This test is not yet approved or cleared by the [place] FDA and has been authorized for detection and/or diagnosis of SARS-CoV-2 by FDA under an Emergency Use Authorization (EUA). This EUA will remain in effect (meaning this test can be used) for the duration of the COVID-19 declaration under Section 564(b)(1) of the Act, 21 U.S.C. section 360bbb-3(b)(1), unless the authorization is terminated or revoked.  Performed at [facility], [place] [address] [place], [place] [address]   MRSA PCR Screening     Status: None   Collection Time: 02/21/21  3:30 AM   Specimen: Nasal Mucosa; Nasopharyngeal  Result Value Ref Range Status   MRSA by PCR NEGATIVE NEGATIVE Final    Comment:        The GeneXpert MRSA Assay (FDA approved for NASAL specimens only), is one component of a comprehensive MRSA colonization surveillance program. It is not intended to diagnose MRSA infection nor to guide or monitor treatment for MRSA infections. Performed at [facility], [place] [address] [place], [place] [address]   Culture, blood (routine x 2)     Status: None (Preliminary result)   Collection Time: 02/21/21  3:48 AM   Specimen: BLOOD RIGHT HAND  Result Value Ref Range Status   Specimen Description BLOOD RIGHT HAND  Final   Special Requests   Final    BOTTLES DRAWN AEROBIC ONLY Blood Culture results may not be optimal due to an inadequate volume of blood received in  culture bottles   Culture   Final    NO GROWTH 3 DAYS Performed at [facility], [place] [address] [place], [place] [address]    Report Status PENDING  Incomplete  Culture, blood (routine x 2)     Status: None (Preliminary result)   Collection Time: 02/21/21  4:13 AM  Specimen: BLOOD LEFT HAND  Result Value Ref Range Status   Specimen Description BLOOD LEFT HAND  Final   Special Requests   Final    BOTTLES DRAWN AEROBIC ONLY Blood Culture results may not be optimal due to an inadequate volume of blood received in culture bottles   Culture   Final    NO GROWTH 3 DAYS Performed at [facility], [place] [address] [place], [place] [address]    Report Status PENDING  Incomplete     Radiology Studies:  No results found.   Scheduled Meds:   . busPIRone  10 mg Oral BID  . Chlorhexidine Gluconate Cloth  6 each Topical Daily  . melatonin  3 mg Oral QHS  . memantine  10 mg Oral BID  . metoprolol tartrate  2.5 mg Intravenous Q12H  . sodium chloride flush  3 mL Intravenous Q12H    Continuous Infusions:   . sodium chloride    . HYDROmorphone 0.4 mg/hr (02/24/21 0935)     LOS: 3 days     Vernell Leep, MD, [place], [facility]. [facility]    To contact the attending provider between 7A-7P or the covering provider during after hours 7P-7A, please log into the web site [email or web address] and access using universal [place] password for that web site. If you do not have the password, please call the hospital operator.  02/24/2021, 4:24 PM

## 2021-02-24 NOTE — Progress Notes (Signed)
Palliative:  HPI: 87 y.o.femalewith past medical history of dementia, atrial fibrillation, mixed systolic and diastolic heart failure, TIA, sick sinus syndrome status post pacemaker and defibrillator, tuberculosis greater than 50 years ago,who was admitted on 5/28/2022with vomiting, constipation, abdominal pain and oliguria. She was found to have a small bowel obstruction secondary to a strangulated hernia. On May 28 she underwent exploratory laparoscopywith hernia repair and small bowel resection. She became hypotensive in the PACU with her pressure in the 50s and heart rate in the 160s. She was admitted by pulmonary critical care to the intensive care unit. This morning the patient's vital signs indicated she was in shock. She was placed on 2 pressors. Her demeanor remained pleasant and happy. Surgery and intensive care were quite concerned as the patient seems too fragile to potentially return to the operating room.  I met today at Ms. Malaiah's bedside. She is sleeping peacefully. Spoke with RN Remo Lipps who reports increasing discomfort and symptoms last night and today and requiring titration of infusion to achieve comfort. She is still drinking and having intake. She awakens and confirms she is not in any pain.   I called and spoke with son and daughter-in-law, Legrand Como and Otila Kluver. We discussed progression at end of life. They want to ensure that she is comfortable and peaceful. They do not want her to suffer. We did discuss potential of ongoing decline and reassessment in the morning and if still stable we can consider hospice placement. Family agree and just want to ensure that she does not return to previous facility. We were able to spend some time reviewing Ms. Vondra's life and the wonderful person she has been to her family and all that have met her. We all agree that she deserves peace and comfort during this time.   All questions/concerns addressed. Emotional support provided.   Exam:  Sleeping comfortably. No distress. Breathing shallow.   Plan: - May consider hospice facility if she remains stable overnight. If she progresses further likely hospital death.   25 min  Vinie Sill, NP Palliative Medicine Team Pager [phone] (Please see [email or web address] for schedule) Team Phone [phone]    Greater than 50%  of this time was spent counseling and coordinating care related to the above assessment and plan

## 2021-02-24 DEATH — deceased

## 2021-02-26 LAB — CULTURE, BLOOD (ROUTINE X 2)
Culture: NO GROWTH
Culture: NO GROWTH

## 2021-03-01 ENCOUNTER — Encounter (HOSPITAL_COMMUNITY): Payer: Self-pay

## 2021-03-01 ENCOUNTER — Ambulatory Visit (HOSPITAL_COMMUNITY): Payer: Medicare Other

## 2021-03-26 NOTE — Death Summary Note (Signed)
DEATH SUMMARY   Patient Details  Name: Mikayla Harper MRN: [id number] DOB: 12/03/1929  Admission/Discharge Information   Admit Date:  03/08/2021  Date of Death: Date of Death: 03/13/2021  Time of Death: Time of Death: 0900  Length of Stay: 4  Referring Physician: Haywood Pao, MD   Reason(s) for Hospitalization    Diagnoses  Preliminary cause of death: Septic shock due to strangulated hernia. Secondary Diagnoses (including complications and co-morbidities):  Principal Problem:   SBO (small bowel obstruction) (HCC) Active Problems:   Hyperlipidemia   HTN (hypertension)   COPD (chronic obstructive pulmonary disease) (HCC)   Chronic combined systolic and diastolic CHF (congestive heart failure) (HCC)   Atrial fibrillation with RVR (HCC)   Aortic atherosclerosis (HCC)   Acute renal failure superimposed on stage 3a chronic kidney disease (HCC)   Lactic acidosis   Polycythemia   Dehydration   Hyponatremia   Leukocytosis   Thrombocytopenia ([place])   Septic shock ([place])   Encounter for infusaport central venous catheter insertion   Brief Hospital Course (including significant findings, care, treatment, and services provided and events leading to death)  [name] is a 87 y.o. year old female with a history of mild dementia, HLD, paroxysmal Afib, sick sinus syndrome/[employer] pacemaker placement 2013, chronic combined systolic and diastolic heart failure, history of TIA, and history of TB >50 years ago who presented from [facility] due to constipation, vomiting, and oliguria. In the ED, patient was found to be tachycardic with normal blood pressure and normal sats. She was evaluated by general surgery, found to have strangulated hernia and resulting SBO. She was taken to surgery urgently, and underwent primary repair of strangulated right inguinal hernia, ex lap, and small bowel resection.  Postop course was complicated by profound septic shock requiring ICU admission.   Eventually transitioned to full comfort care and demised.   Details of hospitalization are as noted below:   SBO due to strangulated hernia, s/p exploratory laparotomy with small bowel resection and repair of strangulated right inguinal hernia 1/51: Complicated postop course including septic shock requiring multiple vasopressors.    Septic shock: Resolved.  A. fib with RVR:   AKI:   Goals of care: Palliative care team assisted with management.    Pertinent Labs and Studies  Significant Diagnostic Studies CT Abdomen Pelvis Wo Contrast  Result Date: 03-08-2021 CLINICAL DATA:  87 year old female with abdominal pain. Concern for bowel obstruction. EXAM: CT ABDOMEN AND PELVIS WITHOUT CONTRAST TECHNIQUE: Multidetector CT imaging of the abdomen and pelvis was performed following the standard protocol without IV contrast. COMPARISON:  None. FINDINGS: Evaluation of this exam is limited in the absence of intravenous contrast. Lower chest: Right lung base linear and ground-glass, likely atelectasis/scarring. Faint scattered nodular density at the left lung base may be sequela of prior inflammatory/infectious process or represent developing pneumonia. Clinical correlation and follow-up with chest CT on a nonemergent/outpatient basis recommended. Partially visualized pericardial effusion measuring 8 mm in thickness. There is coronary vascular calcification and cardiac pacemaker wire. No intra-abdominal free air or free fluid. Hepatobiliary: The liver is unremarkable. No intrahepatic biliary dilatation. The gallbladder is unremarkable. Pancreas: Unremarkable. No pancreatic ductal dilatation or surrounding inflammatory changes. Spleen: Normal in size without focal abnormality. Adrenals/Urinary Tract: The adrenal glands unremarkable. Moderate bilateral renal parenchyma atrophy. There is no hydronephrosis or nephrolithiasis on either side. The visualized ureters and urinary bladder appear unremarkable.  Stomach/Bowel: There is sigmoid diverticulosis without active inflammatory changes. There is herniation of  a segment of distal small bowel lateral to the right inguinal canal. There is inflammatory changes of the herniated bowel with severe narrowing of the bowel segment in the neck of the hernia. There is diffuse dilatation of the small bowel loops proximal to the hernia. The small bowel loops measure up to 3.5 cm in caliber. No evidence of acute appendicitis. Vascular/Lymphatic: Advanced aortoiliac atherosclerotic disease. The IVC is unremarkable. No portal venous gas. There is no adenopathy. Reproductive: Hysterectomy. Other: None Musculoskeletal: Osteopenia with scoliosis and degenerative changes. No acute osseous pathology. IMPRESSION: 1. Small-bowel obstruction secondary to herniation of a segment of distal small bowel lateral to the right inguinal canal. 2. Sigmoid diverticulosis. 3. Faint scattered nodular density at the left lung base may be sequela of prior inflammatory/infectious process or represent developing pneumonia. 4. Aortic Atherosclerosis (ICD10-I70.0). Electronically Signed   By: Anner Crete M.D.   On: 02/19/2021 17:56   DG CHEST PORT 1 VIEW  Result Date: 02/21/2021 CLINICAL DATA:  Central line placement. EXAM: PORTABLE CHEST 1 VIEW COMPARISON:  Jan 29, 2018 FINDINGS: The cardiomediastinal silhouette is unchanged in contour.RIGHT IJ CVC tip terminates over the superior cavoatrial junction. LEFT chest cardiac pacing device. Atherosclerotic calcifications of the aorta. The enteric tube courses through the chest to the abdomen beyond the field-of-view. No pleural effusion. No pneumothorax. Coarsely calcified RIGHT upper lobe granuloma. Scattered bibasilar atelectasis. Min no acute pleuroparenchymal abnormality. Visualized abdomen is unremarkable. Multilevel degenerative changes of the thoracic spine. IMPRESSION: RIGHT IJ CVC tip terminates over the superior cavoatrial junction.  Electronically Signed   By: Valentino Saxon MD   On: 02/21/2021 15:16    Microbiology Recent Results (from the past 240 hour(s))  Resp Panel by RT-PCR (Flu A&B, Covid) Nasopharyngeal Swab     Status: None   Collection Time: 01/31/2021  5:39 PM   Specimen: Nasopharyngeal Swab; Nasopharyngeal(NP) swabs in vial transport medium  Result Value Ref Range Status   SARS Coronavirus 2 by RT PCR NEGATIVE NEGATIVE Final    Comment: (NOTE) SARS-CoV-2 target nucleic acids are NOT DETECTED.  The SARS-CoV-2 RNA is generally detectable in upper respiratory specimens during the acute phase of infection. The lowest concentration of SARS-CoV-2 viral copies this assay can detect is 138 copies/mL. A negative result does not preclude SARS-Cov-2 infection and should not be used as the sole basis for treatment or other patient management decisions. A negative result may occur with  improper specimen collection/handling, submission of specimen other than nasopharyngeal swab, presence of viral mutation(s) within the areas targeted by this assay, and inadequate number of viral copies(<138 copies/mL). A negative result must be combined with clinical observations, patient history, and epidemiological information. The expected result is Negative.  Fact Sheet for Patients:  EntrepreneurPulse.com.au  Fact Sheet for Healthcare Providers:  IncredibleEmployment.be  This test is no t yet approved or cleared by the [place] FDA and  has been authorized for detection and/or diagnosis of SARS-CoV-2 by FDA under an Emergency Use Authorization (EUA). This EUA will remain  in effect (meaning this test can be used) for the duration of the COVID-19 declaration under Section 564(b)(1) of the Act, 21 U.S.C.section 360bbb-3(b)(1), unless the authorization is terminated  or revoked sooner.       Influenza A by PCR NEGATIVE NEGATIVE Final   Influenza B by PCR NEGATIVE NEGATIVE  Final    Comment: (NOTE) The Xpert Xpress SARS-CoV-2/FLU/RSV plus assay is intended as an aid in the diagnosis of influenza from Nasopharyngeal swab specimens and should  not be used as a sole basis for treatment. Nasal washings and aspirates are unacceptable for Xpert Xpress SARS-CoV-2/FLU/RSV testing.  Fact Sheet for Patients: EntrepreneurPulse.com.au  Fact Sheet for Healthcare Providers: IncredibleEmployment.be  This test is not yet approved or cleared by the [place] FDA and has been authorized for detection and/or diagnosis of SARS-CoV-2 by FDA under an Emergency Use Authorization (EUA). This EUA will remain in effect (meaning this test can be used) for the duration of the COVID-19 declaration under Section 564(b)(1) of the Act, 21 U.S.C. section 360bbb-3(b)(1), unless the authorization is terminated or revoked.  Performed at [facility], [place] [address] [place], [place] [address]   MRSA PCR Screening     Status: None   Collection Time: 02/21/21  3:30 AM   Specimen: Nasal Mucosa; Nasopharyngeal  Result Value Ref Range Status   MRSA by PCR NEGATIVE NEGATIVE Final    Comment:        The GeneXpert MRSA Assay (FDA approved for NASAL specimens only), is one component of a comprehensive MRSA colonization surveillance program. It is not intended to diagnose MRSA infection nor to guide or monitor treatment for MRSA infections. Performed at [facility], [place] [address], [place], [place] [address]   Culture, blood (routine x 2)     Status: None (Preliminary result)   Collection Time: 02/21/21  3:48 AM   Specimen: BLOOD RIGHT HAND  Result Value Ref Range Status   Specimen Description BLOOD RIGHT HAND  Final   Special Requests   Final    BOTTLES DRAWN AEROBIC ONLY Blood Culture results may not be optimal due to an inadequate volume of blood received in culture bottles   Culture   Final    NO GROWTH 4 DAYS Performed at  [facility], [place] [address], [place], [place] [address]    Report Status PENDING  Incomplete  Culture, blood (routine x 2)     Status: None (Preliminary result)   Collection Time: 02/21/21  4:13 AM   Specimen: BLOOD LEFT HAND  Result Value Ref Range Status   Specimen Description BLOOD LEFT HAND  Final   Special Requests   Final    BOTTLES DRAWN AEROBIC ONLY Blood Culture results may not be optimal due to an inadequate volume of blood received in culture bottles   Culture   Final    NO GROWTH 4 DAYS Performed at [facility], [address], [place], [place] [address]    Report Status PENDING  Incomplete    Lab Basic Metabolic Panel: Recent Labs  Lab 01/28/2021 1653 02/10/2021 2254 02/21/21 0106  NA 129* 129* 130*  K 4.6 3.9 3.5  CL 85*  --  97*  CO2 26  --  23  GLUCOSE 102*  --  97  BUN 97*  --  83*  CREATININE 2.82*  --  2.06*  CALCIUM 10.3  --  8.2*  MG  --   --  1.8  PHOS  --   --  4.6   Liver Function Tests: Recent Labs  Lab 02/12/2021 1653 02/21/21 0106  AST 38 23  ALT 21 14  ALKPHOS 82 41  BILITOT 1.2 1.3*  PROT 7.8 4.9*  ALBUMIN 3.7 2.7*   Recent Labs  Lab 02/10/2021 1653  LIPASE 70*   No results for input(s): AMMONIA in the last 168 hours. CBC: Recent Labs  Lab 02/15/2021 1653 01/30/2021 2254 02/21/21 0424  WBC 15.4*  --  6.8  NEUTROABS 12.0*  --  6.1  HGB 16.7* 15.0 14.2  HCT 49.7* 44.0 42.2  MCV 94.3  --  93.6  PLT 416*  --  325   Cardiac Enzymes: No results for input(s): CKTOTAL, CKMB, CKMBINDEX, TROPONINI in the last 168 hours. Sepsis Labs: Recent Labs  Lab 02/09/2021 1653 02/21/21 0413 02/21/21 0424  WBC 15.4*  --  6.8  LATICACIDVEN 2.0* 1.2  --     Procedures/Operations  As above   [facility] 03/04/2021, 5:45 PM

## 2021-03-26 DEATH — deceased
